# Patient Record
Sex: Female | Born: 1955 | Race: White | Hispanic: No | Marital: Single | Smoking: Former smoker
Health system: Southern US, Community
[De-identification: ages and names within clinical notes are randomized; demographics above are authoritative.]

## PROBLEM LIST (undated history)

## (undated) DIAGNOSIS — I10 Essential (primary) hypertension: Secondary | ICD-10-CM

## (undated) HISTORY — PX: GANGLION CYST EXCISION: SHX1691

## (undated) HISTORY — PX: WISDOM TOOTH EXTRACTION: SHX21

## (undated) HISTORY — PX: BACK SURGERY: SHX140

---

## 2000-01-20 ENCOUNTER — Encounter: Payer: Self-pay | Admitting: Family Medicine

## 2000-01-20 ENCOUNTER — Ambulatory Visit (HOSPITAL_COMMUNITY): Admission: RE | Admit: 2000-01-20 | Discharge: 2000-01-20 | Payer: Self-pay | Admitting: Family Medicine

## 2001-07-07 ENCOUNTER — Emergency Department (HOSPITAL_COMMUNITY): Admission: EM | Admit: 2001-07-07 | Discharge: 2001-07-07 | Payer: Self-pay | Admitting: Emergency Medicine

## 2001-07-07 ENCOUNTER — Encounter: Payer: Self-pay | Admitting: Emergency Medicine

## 2002-06-05 ENCOUNTER — Emergency Department (HOSPITAL_COMMUNITY): Admission: EM | Admit: 2002-06-05 | Discharge: 2002-06-05 | Payer: Self-pay | Admitting: Emergency Medicine

## 2002-06-05 ENCOUNTER — Encounter: Payer: Self-pay | Admitting: Emergency Medicine

## 2003-12-20 ENCOUNTER — Ambulatory Visit (HOSPITAL_BASED_OUTPATIENT_CLINIC_OR_DEPARTMENT_OTHER): Admission: RE | Admit: 2003-12-20 | Discharge: 2003-12-20 | Payer: Self-pay | Admitting: Orthopedic Surgery

## 2005-04-04 ENCOUNTER — Other Ambulatory Visit: Admission: RE | Admit: 2005-04-04 | Discharge: 2005-04-04 | Payer: Self-pay | Admitting: Family Medicine

## 2015-10-24 ENCOUNTER — Emergency Department (HOSPITAL_COMMUNITY)
Admission: EM | Admit: 2015-10-24 | Discharge: 2015-10-24 | Disposition: A | Discharge status: Home / Self Care | Payer: BLUE CROSS/BLUE SHIELD | Attending: Emergency Medicine | Admitting: Emergency Medicine

## 2015-10-24 ENCOUNTER — Emergency Department (HOSPITAL_COMMUNITY): Payer: BLUE CROSS/BLUE SHIELD

## 2015-10-24 ENCOUNTER — Encounter (HOSPITAL_COMMUNITY): Payer: Self-pay

## 2015-10-24 DIAGNOSIS — Z7982 Long term (current) use of aspirin: Secondary | ICD-10-CM | POA: Insufficient documentation

## 2015-10-24 DIAGNOSIS — Z9119 Patient's noncompliance with other medical treatment and regimen: Secondary | ICD-10-CM | POA: Insufficient documentation

## 2015-10-24 DIAGNOSIS — Z87891 Personal history of nicotine dependence: Secondary | ICD-10-CM | POA: Diagnosis not present

## 2015-10-24 DIAGNOSIS — R200 Anesthesia of skin: Secondary | ICD-10-CM | POA: Diagnosis not present

## 2015-10-24 DIAGNOSIS — I159 Secondary hypertension, unspecified: Secondary | ICD-10-CM | POA: Diagnosis not present

## 2015-10-24 DIAGNOSIS — I10 Essential (primary) hypertension: Secondary | ICD-10-CM | POA: Diagnosis present

## 2015-10-24 HISTORY — DX: Essential (primary) hypertension: I10

## 2015-10-24 LAB — BASIC METABOLIC PANEL
Anion gap: 12 (ref 5–15)
BUN: 10 mg/dL (ref 6–20)
CHLORIDE: 104 mmol/L (ref 101–111)
CO2: 23 mmol/L (ref 22–32)
CREATININE: 0.79 mg/dL (ref 0.44–1.00)
Calcium: 9.4 mg/dL (ref 8.9–10.3)
GFR calc non Af Amer: >60 mL/min (ref >60)
Glucose, Bld: 104 mg/dL — ABNORMAL HIGH (ref 65–99)
Potassium: 3.8 mmol/L (ref 3.5–5.1)
Sodium: 139 mmol/L (ref 135–145)

## 2015-10-24 LAB — CBC
HCT: 51.6 % — ABNORMAL HIGH (ref 36.0–46.0)
Hemoglobin: 17.3 g/dL — ABNORMAL HIGH (ref 12.0–15.0)
MCH: 32.8 pg (ref 26.0–34.0)
MCHC: 33.5 g/dL (ref 30.0–36.0)
MCV: 97.9 fL (ref 78.0–100.0)
PLATELETS: 222 10*3/uL (ref 150–400)
RBC: 5.27 MIL/uL — AB (ref 3.87–5.11)
RDW: 13.3 % (ref 11.5–15.5)
WBC: 7.4 10*3/uL (ref 4.0–10.5)

## 2015-10-24 LAB — I-STAT TROPONIN, ED: Troponin i, poc: 0.01 ng/mL (ref 0.00–0.08)

## 2015-10-24 MED ORDER — AMLODIPINE BESYLATE 10 MG PO TABS: 10.0000 mg | ORAL_TABLET | Freq: Every day | ORAL | Status: DC

## 2015-10-24 MED ORDER — HYDROCHLOROTHIAZIDE 12.5 MG PO TABS: 12.5000 mg | ORAL_TABLET | Freq: Every day | ORAL | Status: DC

## 2015-10-24 NOTE — ED Provider Notes (Addendum)
CSN: [id number]     Arrival date & time 10/24/15  1209 History   First MD Initiated Contact with Patient 10/24/15 1327     Chief Complaint  Patient presents with  . Hypertension  . Numbness     (Consider location/radiation/quality/duration/timing/severity/associated sxs/prior Treatment) HPI   Cynthia Morrison is a 60 y.o. female who presents for evaluation of a single episode of numbness left miles left arm, chest discomfort and headache, which lasted for 3 or 4 seconds, 3 days ago. It resolved spontaneously. She has not had a recurrence of this symptom. She feels normal today. She was encouraged to come in and get checked out, by her daughter. She is supposed to be on blood pressure medicines, but stopped them several years ago because she had lost some weight. 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 She admits to anxiety. She denies fever, chills, chest pain, cough, weakness or difficulty walking, at this time. There are no other known modifying factors.   Past Medical History  Diagnosis Date  . Hypertension    Past Surgical History  Procedure Laterality Date  . Back surgery     History reviewed. No pertinent family history. Social History  Substance Use Topics  . Smoking status: Former Games developer  . Smokeless tobacco: None  . Alcohol Use: Yes   OB History    No data available     Review of Systems  All other systems reviewed and are negative.     Allergies  Codeine and Aleve  Home Medications   Prior to Admission medications   Medication Sig Start Date End Date Taking? Authorizing Provider  aspirin 325 MG tablet Take 325 mg by mouth daily as needed for mild pain or headache.   Yes Historical Provider, MD  ibuprofen (ADVIL,MOTRIN) 200 MG tablet Take 200 mg by mouth daily as needed for headache or moderate pain.   Yes Historical Provider, MD  amLODipine (NORVASC) 10 MG tablet Take 1 tablet (10 mg total) by mouth daily. 10/24/15   Mancel Bale,  MD  hydrochlorothiazide (HYDRODIURIL) 12.5 MG tablet Take 1 tablet (12.5 mg total) by mouth daily. 10/24/15   Mancel Bale, MD   BP 211/107 mmHg  Pulse 118  Temp(Src) 98 F (36.7 C) (Oral)  Resp 15  SpO2 96% Physical Exam  Constitutional: She is oriented to person, place, and time. She appears well-developed and well-nourished.  HENT:  Head: Normocephalic and atraumatic.  Right Ear: External ear normal.  Left Ear: External ear normal.  Eyes: Conjunctivae and EOM are normal. Pupils are equal, round, and reactive to light.  Neck: Normal range of motion and phonation normal. Neck supple.  Cardiovascular: Normal rate, regular rhythm and normal heart sounds.   Pulmonary/Chest: Effort normal and breath sounds normal. She exhibits no bony tenderness.  Abdominal: Soft. There is no tenderness.  Musculoskeletal: Normal range of motion.  Neurological: She is alert and oriented to person, place, and time. No cranial nerve deficit or sensory deficit. She exhibits normal muscle tone. Coordination normal.  Skin: Skin is warm, dry and intact.  Psychiatric: She has a normal mood and affect. Her behavior is normal. Judgment and thought content normal.  Nursing note and vitals reviewed.   ED Course  Procedures (including critical care time)  Medications - No data to display  Patient Vitals for the past 24 hrs:  BP Temp Temp src Pulse Resp SpO2  10/24/15 1320 (!) 211/107 mmHg - - 118 15  96 %  10/24/15 1227 (!) 217/122 mmHg 98 F (36.7 C) Oral 119 20 100 %    2:34 PM Reevaluation with update and discussion. After initial assessment and treatment, an updated evaluation reveals no further complaints. Blood pressure spontaneously improved. Findings discussed with the patient, all questions were answered. Jashiya Bassett L     Labs Review Labs Reviewed  BASIC METABOLIC PANEL - Abnormal; Notable for the following:    Glucose, Bld 104 (*)    All other components within normal limits  CBC -  Abnormal; Notable for the following:    RBC 5.27 (*)    Hemoglobin 17.3 (*)    HCT 51.6 (*)    All other components within normal limits  I-STAT TROPOININ, ED    Imaging Review Dg Chest 2 View  10/24/2015  CLINICAL DATA:  Chest discomfort, high blood pressure EXAM: CHEST  2 VIEW COMPARISON:  None. FINDINGS: Cardiomediastinal silhouette is unremarkable. Mild mid thoracic dextroscoliosis. No acute infiltrate or pleural effusion. No pulmonary edema. There is moderate compression deformity upper endplate of probable T10 vertebral body of indeterminate age. Clinical correlation is necessary. IMPRESSION: No active disease. Compression deformity T10 vertebral body of indeterminate age. Clinical correlation is necessary. Electronically Signed   By: Natasha Mead M.D.   On: 10/24/2015 13:52   I have personally reviewed and evaluated these images and lab results as part of my medical decision-making.   EKG Interpretation   Date/Time:  Wednesday October 24 2015 12:31:49 EST Ventricular Rate:  116 PR Interval:  157 QRS Duration: 95 QT Interval:  316 QTC Calculation: 439 R Axis:   8 Text Interpretation:  Sinus tachycardia Biatrial enlargement Left  ventricular hypertrophy Borderline T abnormalities, anterior leads No old  tracing to compare Confirmed by [facility]  MD, Jaylan Hinojosa [phone]) on 10/24/2015  4:22:59 PM      MDM   Final diagnoses:  Secondary hypertension, unspecified    Hypertension, with medication noncompliance. Nonspecific brief episode of discomfort 3 days ago, has not persisted. I doubt that this represents CVA, TIA, or hypertensive urgency. No prior metabolic instability at this time. Patient understands importance of taking blood pressure medicines and follow-up, for further evaluation and treatment.   Nursing Notes Reviewed/ Care Coordinated Applicable Imaging Reviewed Interpretation of Laboratory Data incorporated into ED treatment  The patient appears reasonably screened and/or  stabilized for discharge and I doubt any other medical condition or other [facility] requiring further screening, evaluation, or treatment in the ED at this time prior to discharge.  Plan: Home Medications- Norvasc, HCTZ; Home Treatments-increase potassium in diet rest; return here if the recommended treatment, does not improve the symptoms; Recommended follow up- PCP of choice 2-3 weeks for checkup   Mancel Bale, MD 10/24/15 1617  Mancel Bale, MD 10/24/15 1623

## 2015-10-24 NOTE — Progress Notes (Signed)
[facility] ED CM noted pt with coverage but no pcp listed [facility] ED CM spoke with pt and her daughter Erie Noe at bedside on how to obtain an in network pcp with insurance coverage via the customer service number or web site  Cm reviewed ED level of care for crisis/emergent services and community pcp level of care to manage continuous or chronic medical concerns.  The pt voiced understanding CM encouraged pt and discussed pt's responsibility to verify with pt's insurance carrier that any recommended medical provider offered by any emergency room or a hospital provider is within the carrier's network. The pt voiced understanding

## 2015-10-24 NOTE — ED Notes (Signed)
Pt c/o numbness to L side mouth, L arm, and generalized chest x 1 minute x 2 days ago and hypertension.  Denies pain.  Pt reports severe headache after numbness episode.  Hx of HTN and sts she took herself off medication x 3 years ago.

## 2015-10-24 NOTE — Discharge Instructions (Signed)
Start the blood pressure medicine today. Make sure that you are eating foods that contain plenty of potassium. Watch your dietary intake of salt, fats, and carbohydrates. It is important to follow-up with a primary care doctor for further evaluation in one or 2 weeks. Use the resource guide listed below, to find a physician.     Hypertension Hypertension, commonly called high blood pressure, is when the force of blood pumping through your arteries is too strong. Your arteries are the blood vessels that carry blood from your heart throughout your body. A blood pressure reading consists of a higher number over a lower number, such as 110/72. The higher number (systolic) is the pressure inside your arteries when your heart pumps. The lower number (diastolic) is the pressure inside your arteries when your heart relaxes. Ideally you want your blood pressure below 120/80. Hypertension forces your heart to work harder to pump blood. Your arteries may become narrow or stiff. Having untreated or uncontrolled hypertension can cause heart attack, stroke, kidney disease, and other problems. RISK FACTORS Some risk factors for high blood pressure are controllable. Others are not.  Risk factors you cannot control include:   Race. You may be at higher risk if you are African American.  Age. Risk increases with age.  Gender. Men are at higher risk than women before age 33 years. After age 86, women are at higher risk than men. Risk factors you can control include:  Not getting enough exercise or physical activity.  Being overweight.  Getting too much fat, sugar, calories, or salt in your diet.  Drinking too much alcohol. SIGNS AND SYMPTOMS Hypertension does not usually cause signs or symptoms. Extremely high blood pressure (hypertensive crisis) may cause headache, anxiety, shortness of breath, and nosebleed. DIAGNOSIS To check if you have hypertension, your health care provider will measure your  blood pressure while you are seated, with your arm held at the level of your heart. It should be measured at least twice using the same arm. Certain conditions can cause a difference in blood pressure between your right and left arms. A blood pressure reading that is higher than normal on one occasion does not mean that you need treatment. If it is not clear whether you have high blood pressure, you may be asked to return on a different day to have your blood pressure checked again. Or, you may be asked to monitor your blood pressure at home for 1 or more weeks. TREATMENT Treating high blood pressure includes making lifestyle changes and possibly taking medicine. Living a healthy lifestyle can help lower high blood pressure. You may need to change some of your habits. Lifestyle changes may include:  Following the DASH diet. This diet is high in fruits, vegetables, and whole grains. It is low in salt, red meat, and added sugars.  Keep your sodium intake below 2,300 mg per day.  Getting at least 30-45 minutes of aerobic exercise at least 4 times per week.  Losing weight if necessary.  Not smoking.  Limiting alcoholic beverages.  Learning ways to reduce stress. Your health care provider may prescribe medicine if lifestyle changes are not enough to get your blood pressure under control, and if one of the following is true:  You are 38-75 years of age and your systolic blood pressure is above 140.  You are 22 years of age or older, and your systolic blood pressure is above 150.  Your diastolic blood pressure is above 90.  You have diabetes, and  your systolic blood pressure is over 140 or your diastolic blood pressure is over 90.  You have kidney disease and your blood pressure is above 140/90.  You have heart disease and your blood pressure is above 140/90. Your personal target blood pressure may vary depending on your medical conditions, your age, and other factors. HOME CARE  INSTRUCTIONS  Have your blood pressure rechecked as directed by your health care provider.   Take medicines only as directed by your health care provider. Follow the directions carefully. Blood pressure medicines must be taken as prescribed. The medicine does not work as well when you skip doses. Skipping doses also puts you at risk for problems.  Do not smoke.   Monitor your blood pressure at home as directed by your health care provider. SEEK MEDICAL CARE IF:   You think you are having a reaction to medicines taken.  You have recurrent headaches or feel dizzy.  You have swelling in your ankles.  You have trouble with your vision. SEEK IMMEDIATE MEDICAL CARE IF:  You develop a severe headache or confusion.  You have unusual weakness, numbness, or feel faint.  You have severe chest or abdominal pain.  You vomit repeatedly.  You have trouble breathing. MAKE SURE YOU:   Understand these instructions.  Will watch your condition.  Will get help right away if you are not doing well or get worse.   This information is not intended to replace advice given to you by your health care provider. Make sure you discuss any questions you have with your health care provider.   Document Released: 09/08/2005 Document Revised: 01/23/2015 Document Reviewed: 07/01/2013 Elsevier Interactive Patient Education 2016 [employer].    Emergency Department Resource Guide 1) Find a Doctor and Pay Out of Pocket Although you won't have to find out who is covered by your insurance plan, it is a good idea to ask around and get recommendations. You will then need to call the office and see if the doctor you have chosen will accept you as a new patient and what types of options they offer for patients who are self-pay. Some doctors offer discounts or will set up payment plans for their patients who do not have insurance, but you will need to ask so you aren't surprised when you get to your  appointment.  2) Contact Your Local Health Department Not all health departments have doctors that can see patients for sick visits, but many do, so it is worth a call to see if yours does. If you don't know where your local health department is, you can check in your phone book. The CDC also has a tool to help you locate your state's health department, and many state websites also have listings of all of their local health departments.  3) Find a Walk-in Clinic If your illness is not likely to be very severe or complicated, you may want to try a walk in clinic. These are popping up all over the country in pharmacies, drugstores, and shopping centers. They're usually staffed by nurse practitioners or physician assistants that have been trained to treat common illnesses and complaints. They're usually fairly quick and inexpensive. However, if you have serious medical issues or chronic medical problems, these are probably not your best option.  No Primary Care Doctor: - Call Health Connect at  [phone] - they can help you locate a primary care doctor that  accepts your insurance, provides certain services, etc. - Physician Referral Service- [phone]  Chronic Pain Problems: Organization         Address  Phone   Notes  [facility]  [phone] Patients need to be referred by their primary care doctor.   Medication Assistance: Organization         Address  Phone   Notes  [facility] Medication [facility] [address] [place], [place] [address] [phone] --Must be a resident of [facility] -- Must have NO insurance coverage whatsoever (no Medicaid/ Medicare, etc.) -- The pt. MUST have a primary care doctor that directs their care regularly and follows them in the community   [facility]  [phone]   [employer]  [phone]    Agencies that provide inexpensive medical care: Organization         Address  Phone   Notes  [facility]  [phone]   [facility]    [phone]   [facility] [address] [place], [place] [address] [phone]   [facility] [address], [place] [phone]   Planned Parenthood    [phone]   [facility]    [phone]   [facility]  [address], [place] Phone:  [phone], Fax:  [phone] Hours of Operation:  9 am - 6 pm, M-F.  Also accepts Medicaid/Medicare and self-pay.  [facility]  [address], [place] Phone: [phone], Fax: [phone]. Hours of Operation:  8:30 am - 5:30 pm, M-F.  Also accepts Medicaid and self-pay.  [facility] [place] [address] [place] Phone: [phone]   [facility] [address] Natasha Bence [place], [place] [phone], Ext. [phone] Mondays & Thursdays: 7-9 AM.  First 15 patients are seen on a first come, first serve basis.    Medicaid-accepting [facility] Providers:  Organization         Address  Phone   Notes  [facility] [address], [place] [phone] Also accepts self-pay patients.  [facility] [address] Laurell Josephs [place], [place]  [phone]   [facility] [address], [place] [phone]   [facility] [address], [place] [phone]   Renaye Rakers [address], [place]   [phone] Only accepts [place] Access [place] patients after they have their name applied to their card.   Self-Pay (no insurance) in [facility]:  Organization         Address  Phone   Notes  Sickle Cell Patients, [facility] [address] [place], [place] [phone]   [facility] [address] [place], [place] [phone]   [facility]  [place]  [address], [place] [phone]   [facility]/Dr. Osei-Bonsu  [address] [place] or [address], [place] [phone] Phone number for both [place] and [place] locations is the same.  [facility] [address] [place] [phone]   [facility] [address] [place] or [address] [phone]   [facility] [address], [place] [phone], phone; [phone], fax Sees  patients 1st and 3rd Saturday of every month.  Must not qualify for public or private insurance (i.e. Medicaid, Medicare, [place] Health Choice, Veterans' Benefits)  Household income should be no more than 200% of the poverty level The clinic cannot treat you if you are pregnant or think you are pregnant  Sexually transmitted diseases are not treated at the clinic.    Dental Care: Organization         Address  Phone  Notes  [facility] Department of [facility] [address] [place], [place] [phone] Accepts children up to age 62 who are enrolled in [place] or [place] Health Choice; pregnant women with a Medicaid card; and children who have applied for Medicaid or [place] Health Choice, but were declined, whose parents can pay a reduced fee at time of service.  [facility] Department of [facility]  [address], [place] [phone] Accepts children up to age 26 who are enrolled in [place] or [place] Health Choice; pregnant women with a Medicaid card; and children who have applied for Medicaid or [place] Health Choice, but were declined, whose parents can pay a reduced fee at time of service.  [facility]  [address] [place], [place] [phone] Patients are seen by appointment only. Walk-ins are not accepted. [facility] will see patients 80 years of age and  older. Monday - Tuesday (8am-5pm) Most Wednesdays (8:30-5pm) $30 per visit, cash only  [facility]  [address], [facility] [phone] Patients are seen by appointment only. Walk-ins are not accepted. [facility] will see patients 13 years of age and older. One Wednesday Evening (Monthly: Volunteer Based).  $30 per visit, cash only  [employer]  [phone] for adults; Children under age 75, call Graduate Pediatric Dentistry at [phone]. Children aged 110-14, please call [phone] to request a pediatric application.  Dental services are provided in all areas of dental care including fillings, crowns and bridges, complete and partial dentures, implants, gum treatment, root canals, and extractions. Preventive care is also provided. Treatment is provided to both adults and children. Patients are selected via a lottery and there is often a waiting list.   [facility] [address], [place]  [phone] [email or web address]   [facility] [address] [place], [place] [phone], Ext. [phone] Second and Fourth Thursday of each month, opens at 6:30 AM; Clinic ends at 9 AM.  Patients are seen on a first-come first-served basis, and a limited number are seen during each clinic.   [facility]  [address] Ether Griffins [place], [place] [phone]   Eligibility Requirements You must have lived in [place], [place], or [place] counties for at least the last three months.   You cannot be eligible for state or federal sponsored [place], including [employer], [place], or [employer].   You generally cannot be eligible for healthcare insurance through your employer.    How to apply: Eligibility screenings are held every Tuesday and Wednesday afternoon from 1:00 pm until 4:00 pm. You do not need an appointment for the interview!  [facility] [address],  [place], [place] [phone]   [facility]  [phone]   [facility] Health Department  [phone]   [facility] Health Department  [phone]    Behavioral Health Resources in the Community: Intensive Outpatient Programs  Organization         Address  Phone  Notes  [facility] [address] [place], [place] [phone]   [facility] [address] [place], [place] [phone]   [facility] [address], [place], [place]  [phone]   [facility] [address]  [place], [place] [phone] or [phone]   Substance Abuse Resources Organization         Address  Phone  Notes  [facility]  [phone]   [facility]  [phone]   [facility]  [phone]   Floydene Flock  [phone]   Residential & Outpatient Substance Abuse Program  [phone]   Psychological Services Organization         Address  Phone  Notes  [facility]al Health  [phone]   [facility] Services  [phone]   [facility] [address] [place] [phone] or [phone]    Mobile Crisis Teams Organization         Address  Phone  Notes  [facility], Mobile Crisis Care Unit  [phone]   [facility]  [address] [place], [place] [phone]   Doristine Locks [address] [place] [phone]    Self-Help/Support Groups Organization         Address  Phone             Notes  [facility] [place] - variety of support groups  [phone]- [phone] Call for more information  Narcotics Anonymous (NA), [facility] [address], [employer] [place]  2 meetings at this location   Statistician         Address  Phone  Notes  [facility] [address],    [place]  [phone]   [facility]  [address], [place] [address], [place], [place] [phone]   [facility] [address] [place], [place] [phone] Admissions: 8am-3pm M-F  [facility] [address]    [place], [place] [phone]   [facility] [address] [place], [place], [place] [phone]   [facility] [place] [address]  [place], [place] [phone]   [facility] - Intensive Outpatient [address] Laurell Josephs [address], [place], [place] [phone]   [facility] ([facility] [address]  [place], [place] [phone] or [phone]   [facility] ([facility]) [address] [place], [place] [phone] Accepts Medicaid  [facility] [place] [address]  [place] [phone] Substance Abuse/Addiction Treatment   [facility] Organization         Address  Phone  Notes  [facility]  [phone]   Angie Fava, PhD [address] Ervin Knack [place], [place]   [phone] or [phone]   [facility]   [address] [place], [place] [phone]   [facility] [address], [place], [place] [phone] Insurance/Medicaid/sponsorship through [facility] [address]                                    [place], [place] [phone] Therapy/tele-psych/case  [facility] [address] [place] [phone]    Dr. Lolly Mustache  (  [phone]   [facility]  [facility] Dept. 1) [address], [place] 2) [address] [place] 3)  [address], [place] [phone]  [phone]   [facility] Child Abuse Hotline [phone]) [phone] or 2405143370 (After Hours)        DASH Eating Plan DASH stands for "Dietary Approaches to Stop Hypertension." The DASH eating plan is a healthy eating plan that has been shown to reduce  high blood pressure (hypertension). Additional health benefits may include reducing the risk of type 2 diabetes mellitus, heart disease, and stroke. The DASH eating plan may also help with weight loss. WHAT DO I NEED TO KNOW ABOUT THE DASH EATING PLAN? For the DASH eating plan, you will follow these general guidelines:  Choose foods with a percent daily value for sodium of less than 5% (as listed on the food label).  Use salt-free seasonings or herbs instead of table salt or sea salt.  Check with your health care provider or pharmacist before using salt substitutes.  Eat lower-sodium products, often labeled as "lower sodium" or "no salt added."  Eat fresh foods.  Eat more vegetables, fruits, and low-fat dairy products.  Choose whole grains. Look for the word "whole" as the first word in the ingredient list.  Choose fish and skinless chicken or [place] more often than red meat. Limit fish, poultry, and meat to 6 oz (170 g) each day.  Limit sweets, desserts, sugars, and sugary drinks.  Choose heart-healthy fats.  Limit cheese to 1 oz (28 g) per day.  Eat more home-cooked food and less restaurant, buffet, and fast food.  Limit fried foods.  Cook foods using methods other than frying.  Limit canned vegetables. If you do use them, rinse them well to decrease the sodium.  When eating at a restaurant, ask that your food be prepared with less salt, or no salt if possible. WHAT FOODS CAN I EAT? Seek help from a dietitian for individual calorie needs. Grains Whole grain or whole wheat bread. Brown rice. Whole grain or whole wheat pasta. Quinoa, bulgur, and whole grain cereals. Low-sodium cereals. Corn or whole wheat flour tortillas. Whole grain cornbread. Whole grain crackers. Low-sodium crackers. Vegetables Fresh or frozen vegetables (raw, steamed, roasted, or grilled). Low-sodium or reduced-sodium tomato and vegetable juices. Low-sodium or reduced-sodium tomato sauce and paste.  Low-sodium or reduced-sodium canned vegetables.  Fruits All fresh, canned (in natural juice), or frozen fruits. Meat and Other Protein Products Ground beef (85% or leaner), grass-fed beef, or beef trimmed of fat. Skinless chicken or [place]. Ground chicken or [place]. Pork trimmed of fat. All fish and seafood. Eggs. Dried beans, peas, or lentils. Unsalted nuts and seeds. Unsalted canned beans. Dairy Low-fat dairy products, such as skim or 1% milk, 2% or reduced-fat cheeses, low-fat ricotta or cottage cheese, or plain low-fat yogurt. Low-sodium or reduced-sodium cheeses. Fats and Oils Tub margarines without trans fats. Light or reduced-fat mayonnaise and salad dressings (reduced sodium). Avocado. Safflower, olive, or canola oils. Natural peanut or almond butter. Other Unsalted popcorn and pretzels. The items listed above may not be a complete list of recommended foods or beverages. Contact your dietitian for more options. WHAT FOODS ARE NOT RECOMMENDED? Grains White bread. White pasta. White rice. Refined cornbread. Bagels and croissants. Crackers that contain trans fat. Vegetables Creamed or fried vegetables. Vegetables in a cheese sauce. Regular canned vegetables. Regular canned tomato sauce and paste. Regular  tomato and vegetable juices. Fruits Dried fruits. Canned fruit in light or heavy syrup. Fruit juice. Meat and Other Protein Products Fatty cuts of meat. Ribs, chicken wings, bacon, sausage, bologna, salami, chitterlings, fatback, hot dogs, bratwurst, and packaged luncheon meats. Salted nuts and seeds. Canned beans with salt. Dairy Whole or 2% milk, cream, half-and-half, and cream cheese. Whole-fat or sweetened yogurt. Full-fat cheeses or blue cheese. Nondairy creamers and whipped toppings. Processed cheese, cheese spreads, or cheese curds. Condiments Onion and garlic salt, seasoned salt, table salt, and sea salt. Canned and packaged gravies. Worcestershire sauce. Tartar sauce. Barbecue  sauce. Teriyaki sauce. Soy sauce, including reduced sodium. Steak sauce. Fish sauce. Oyster sauce. Cocktail sauce. Horseradish. Ketchup and mustard. Meat flavorings and tenderizers. Bouillon cubes. Hot sauce. Tabasco sauce. Marinades. Taco seasonings. Relishes. Fats and Oils Butter, stick margarine, lard, shortening, ghee, and bacon fat. Coconut, palm kernel, or palm oils. Regular salad dressings. Other Pickles and olives. Salted popcorn and pretzels. The items listed above may not be a complete list of foods and beverages to avoid. Contact your dietitian for more information. WHERE CAN I FIND MORE INFORMATION? National Heart, Lung, and Blood Institute: [employer]   This information is not intended to replace advice given to you by your health care provider. Make sure you discuss any questions you have with your health care provider.   Document Released: 08/28/2011 Document Revised: 09/29/2014 Document Reviewed: 07/13/2013 Elsevier Interactive Patient Education 2016 [employer] Inc.  Potassium Content of Foods Potassium is a mineral found in many foods and drinks. It helps keep fluids and minerals balanced in your body and affects how steadily your heart beats. Potassium also helps control your blood pressure and keep your muscles and nervous system healthy. Certain health conditions and medicines may change the balance of potassium in your body. When this happens, you can help balance your level of potassium through the foods that you do or do not eat. Your health care provider or dietitian may recommend an amount of potassium that you should have each day. The following lists of foods provide the amount of potassium (in parentheses) per serving in each item. HIGH IN POTASSIUM  The following foods and beverages have 200 mg or more of potassium per serving:  Apricots, 2 raw or 5 dry (200 mg).  Artichoke, 1 medium (345 mg).  Avocado, raw,  each (245  mg).  Banana, 1 medium (425 mg).  Beans, lima, or baked beans, canned,  cup (280 mg).  Beans, white, canned,  cup (595 mg).  Beef roast, 3 oz (320 mg).  Beef, ground, 3 oz (270 mg).  Beets, raw or cooked,  cup (260 mg).  Bran muffin, 2 oz (300 mg).  Broccoli,  cup (230 mg).  Brussels sprouts,  cup (250 mg).  Cantaloupe,  cup (215 mg).  Cereal, 100% bran,  cup (200-400 mg).  Cheeseburger, single, fast food, 1 each (225-400 mg).  Chicken, 3 oz (220 mg).  Clams, canned, 3 oz (535 mg).  Crab, 3 oz (225 mg).  Dates, 5 each (270 mg).  Dried beans and peas,  cup (300-475 mg).  Figs, dried, 2 each (260 mg).  Fish: halibut, tuna, cod, snapper, 3 oz (480 mg).  Fish: salmon, haddock, swordfish, perch, 3 oz (300 mg).  Fish, tuna, canned 3 oz (200 mg).  [place] fries, fast food, 3 oz (470 mg).  Granola with fruit and nuts,  cup (200 mg).  Grapefruit juice,  cup (200 mg).  Greens, beet,  cup (655 mg).  Honeydew melon,  cup (200 mg).  Kale, raw, 1 cup (300 mg).  Kiwi, 1 medium (240 mg).  Kohlrabi, rutabaga, parsnips,  cup (280 mg).  Lentils,  cup (365 mg).  Mango, 1 each (325 mg).  Milk, chocolate, 1 cup (420 mg).  Milk: nonfat, low-fat, whole, buttermilk, 1 cup (350-380 mg).  Molasses, 1 Tbsp (295 mg).  Mushrooms,  cup (280) mg.  Nectarine, 1 each (275 mg).  Nuts: almonds, peanuts, hazelnuts, [place], cashew, mixed, 1 oz (200 mg).  Nuts, pistachios, 1 oz (295 mg).  Orange, 1 each (240 mg).  Orange juice,  cup (235 mg).  Papaya, medium,  fruit (390 mg).  Peanut butter, chunky, 2 Tbsp (240 mg).  Peanut butter, smooth, 2 Tbsp (210 mg).  Pear, 1 medium (200 mg).  Pomegranate, 1 whole (400 mg).  Pomegranate juice,  cup (215 mg).  Pork, 3 oz (350 mg).  Potato chips, salted, 1 oz (465 mg).  Potato, baked with skin, 1 medium (925 mg).  Potatoes, boiled,  cup (255 mg).  Potatoes, mashed,  cup (330 mg).  Prune juice,   cup (370 mg).  Prunes, 5 each (305 mg).  Pudding, chocolate,  cup (230 mg).  Pumpkin, canned,  cup (250 mg).  Raisins, seedless,  cup (270 mg).  Seeds, sunflower or pumpkin, 1 oz (240 mg).  Soy milk, 1 cup (300 mg).  Spinach,  cup (420 mg).  Spinach, canned,  cup (370 mg).  Sweet potato, baked with skin, 1 medium (450 mg).  Swiss chard,  cup (480 mg).  Tomato or vegetable juice,  cup (275 mg).  Tomato sauce or puree,  cup (400-550 mg).  Tomato, raw, 1 medium (290 mg).  Tomatoes, canned,  cup (200-300 mg).  [place], 3 oz (250 mg).  Wheat germ, 1 oz (250 mg).  Winter squash,  cup (250 mg).  Yogurt, plain or fruited, 6 oz (260-435 mg).  Zucchini,  cup (220 mg). MODERATE IN POTASSIUM The following foods and beverages have 50-200 mg of potassium per serving:  Apple, 1 each (150 mg).  Apple juice,  cup (150 mg).  Applesauce,  cup (90 mg).  Apricot nectar,  cup (140 mg).  Asparagus, small spears,  cup or 6 spears (155 mg).  Bagel, cinnamon raisin, 1 each (130 mg).  Bagel, egg or plain, 4 in., 1 each (70 mg).  Beans, green,  cup (90 mg).  Beans, yellow,  cup (190 mg).  Beer, regular, 12 oz (100 mg).  Beets, canned,  cup (125 mg).  Blackberries,  cup (115 mg).  Blueberries,  cup (60 mg).  Bread, whole wheat, 1 slice (70 mg).  Broccoli, raw,  cup (145 mg).  Cabbage,  cup (150 mg).  Carrots, cooked or raw,  cup (180 mg).  Cauliflower, raw,  cup (150 mg).  Celery, raw,  cup (155 mg).  Cereal, bran flakes, cup (120-150 mg).  Cheese, cottage,  cup (110 mg).  Cherries, 10 each (150 mg).  Chocolate, 1 oz bar (165 mg).  Coffee, brewed 6 oz (90 mg).  Corn,  cup or 1 ear (195 mg).  Cucumbers,  cup (80 mg).  Egg, large, 1 each (60 mg).  Eggplant,  cup (60 mg).  Endive, raw, cup (80 mg).  English muffin, 1 each (65 mg).  Fish, orange roughy, 3 oz (150 mg).  Frankfurter, beef or pork, 1 each (75  mg).  Fruit cocktail,  cup (115 mg).  Grape juice,  cup (170 mg).  Grapefruit,  fruit (175  mg).  Grapes,  cup (155 mg).  Greens: kale, turnip, collard,  cup (110-150 mg).  Ice cream or frozen yogurt, chocolate,  cup (175 mg).  Ice cream or frozen yogurt, vanilla,  cup (120-150 mg).  Lemons, limes, 1 each (80 mg).  Lettuce, all types, 1 cup (100 mg).  Mixed vegetables,  cup (150 mg).  Mushrooms, raw,  cup (110 mg).  Nuts: walnuts, pecans, or macadamia, 1 oz (125 mg).  Oatmeal,  cup (80 mg).  Okra,  cup (110 mg).  Onions, raw,  cup (120 mg).  Peach, 1 each (185 mg).  Peaches, canned,  cup (120 mg).  Pears, canned,  cup (120 mg).  Peas, green, frozen,  cup (90 mg).  Peppers, green,  cup (130 mg).  Peppers, red,  cup (160 mg).  Pineapple juice,  cup (165 mg).  Pineapple, fresh or canned,  cup (100 mg).  Plums, 1 each (105 mg).  Pudding, vanilla,  cup (150 mg).  Raspberries,  cup (90 mg).  Rhubarb,  cup (115 mg).  Rice, wild,  cup (80 mg).  Shrimp, 3 oz (155 mg).  Spinach, raw, 1 cup (170 mg).  Strawberries,  cup (125 mg).  Summer squash  cup (175-200 mg).  Swiss chard, raw, 1 cup (135 mg).  Tangerines, 1 each (140 mg).  Tea, brewed, 6 oz (65 mg).  Turnips,  cup (140 mg).  Watermelon,  cup (85 mg).  Wine, red, table, 5 oz (180 mg).  Wine, white, table, 5 oz (100 mg). LOW IN POTASSIUM The following foods and beverages have less than 50 mg of potassium per serving.  Bread, white, 1 slice (30 mg).  Carbonated beverages, 12 oz (less than 5 mg).  Cheese, 1 oz (20-30 mg).  Cranberries,  cup (45 mg).  Cranberry juice cocktail,  cup (20 mg).  Fats and oils, 1 Tbsp (less than 5 mg).  Hummus, 1 Tbsp (32 mg).  Nectar: papaya, mango, or pear,  cup (35 mg).  Rice, white or brown,  cup (50 mg).  Spaghetti or macaroni,  cup cooked (30 mg).  Tortilla, flour or corn, 1 each (50 mg).  Waffle, 4 in., 1  each (50 mg).  Water chestnuts,  cup (40 mg).   This information is not intended to replace advice given to you by your health care provider. Make sure you discuss any questions you have with your health care provider.   Document Released: 04/22/2005 Document Revised: 09/13/2013 Document Reviewed: 08/05/2013 Elsevier Interactive Patient Education [employer]

## 2016-02-01 ENCOUNTER — Telehealth: Payer: Self-pay | Admitting: Neurology

## 2016-02-01 ENCOUNTER — Encounter: Payer: Self-pay | Admitting: Neurology

## 2016-02-01 ENCOUNTER — Ambulatory Visit (INDEPENDENT_AMBULATORY_CARE_PROVIDER_SITE_OTHER): Payer: BLUE CROSS/BLUE SHIELD | Admitting: Neurology

## 2016-02-01 VITALS — BP 176/98 | HR 100 | Resp 18 | Ht 61.0 in | Wt 149.5 lb

## 2016-02-01 DIAGNOSIS — R42 Dizziness and giddiness: Secondary | ICD-10-CM

## 2016-02-01 DIAGNOSIS — F411 Generalized anxiety disorder: Secondary | ICD-10-CM | POA: Diagnosis not present

## 2016-02-01 DIAGNOSIS — I10 Essential (primary) hypertension: Secondary | ICD-10-CM

## 2016-02-01 MED ORDER — BUSPIRONE HCL 15 MG PO TABS: 15.0000 mg | ORAL_TABLET | Freq: Two times a day (BID) | ORAL | Status: DC

## 2016-02-01 NOTE — Progress Notes (Signed)
[facility]  PATIENT: Cynthia Morrison DOB: 12/29/1955  REFERRING DOCTOR OR PCP:  Juluis Rainier SOURCE: patient, and records from [facility]  _________________________________   HISTORICAL  CHIEF COMPLAINT:  Chief Complaint  Patient presents with  . Dizziness    [name] is here with c/o intermittent dizziness onset 3 mos. ago, after starting Lisinopril, Toprol, HCTZ, and Clonazepam.  Sts. dizziness occurs after taking each dose of Lisinopril.  Sts. has had full cardiac w/u which was neg/fim    HISTORY OF PRESENT ILLNESS:  I had the pleasure seeing you patient, Cynthia Morrison, at [facility] for neurologic consultation regarding her lightheadedness and other symptoms.  She was feeling at baseline until January this year and she was found to have hypertension and was started on blood pressure medications. On 10/24/2015, she went to the emergency room and had a blood pressure of 217/122.  Currently, she is on lisinopril 20 mg twice a day, norvasc 10 mg  metoprolol XL 50 mg daily and HCTZ 12.5 mg daily.  Since starting blood pressure medication, she notes that she has a low level of lightheadedness when she is sitting that becomes worse when she is standing.    She also notes that her lightheadedness will be worse when she does not eat. She needs to also eat to take her blood pressure medications. Unfortunately, she gets a lot of coughing starting the lisinopril, often to the point of nausea and that reduces her ability to eat.    She denies any spinning or translational vertigo sensation  She also has had quite a bit of anxiety this year. She did not note any anxiety last year but since January has experienced much more. Specifically, she notes that when she is in a busy environment either at work or outside of her home that she can feel lightheaded and more likely to feel anxious. In her home, lightheadedness usually only occurs when she is standing. However,  if she is in a different environment she also will have some while sitting but will still be worse when she stands.      She is noting much less ability to focus at work because of the dizziness. She works in Civil engineer, contracting. She has not worked since February 15 because she is felt to be unsafe due to her symptoms. She also has not been driving since then because she feels that there is decreased peripheral vision and that she is unsafe to drive. Of note, she does not note difficulties with her vision if she is a passenger instead of the driver. Therefore, she has been mostly staying in the house the last 2 months.   She also has noted that for the past couple months she has felt disoriented when she is in a busy place like a shopping center normal. She does not feel the need to escape and does not feel panicky.  She was tried on Zoloft for her anxiety but felt strange and had nausea when she took it so stopped after 1 day. She does take on clonazepam 0.5 mg about twice a week. It makes her sleepy so she does not want to increase the dose any. She has not been tried on other SSRIs or on BuSpar. A few years ago, when she was having pain after neurosurgery, she was placed on amitriptyline and felt better.    She recently saw a cardiologist and the lisinopril dose was decreased from 40 mg twice a day to  20 mg twice a day. She does think that there has been a little bit less coughing since she has done that.  This morning, her stomach was upset so she did not take her blood pressure medicines before the visit. Orthostatic vital signs are below.    REVIEW OF SYSTEMS: Constitutional: No fevers, chills, sweats, or change in appetite.    Eyes: No visual changes, double vision, eye pain Ear, nose and throat: No hearing loss, ear pain, nasal congestion, sore throat Cardiovascular: No chest pain, palpitations Respiratory: No shortness of breath at rest or with exertion.   No  wheezes GastrointestinaI: No nausea, vomiting, diarrhea, abdominal pain now but gets nauseous frequently.   Genitourinary: No dysuria, urinary retention or frequency.  No nocturia. Musculoskeletal: No neck pain, back pain Integumentary: No rash, pruritus, skin lesions Neurological: as above Psychiatric: No depression at this time.  Notes anxiety Endocrine: No palpitations, diaphoresis, change in appetite, change in weigh or increased thirst Hematologic/Lymphatic: No anemia, purpura, petechiae. Allergic/Immunologic: No itchy/runny eyes, nasal congestion, recent allergic reactions, rashes  ALLERGIES: Allergies  Allergen Reactions  . Codeine Other (See Comments)    Nightmares   . Aleve [Naproxen Sodium] Rash    HOME MEDICATIONS:  Current outpatient prescriptions:  .  amLODipine (NORVASC) 10 MG tablet, Take 1 tablet (10 mg total) by mouth daily., Disp: 30 tablet, Rfl: 3 .  aspirin 325 MG tablet, Take 325 mg by mouth daily as needed for mild pain or headache., Disp: , Rfl:  .  clonazePAM (KLONOPIN) 0.5 MG tablet, take 1 tablet by mouth twice a day if needed, Disp: , Rfl: 0 .  hydrochlorothiazide (HYDRODIURIL) 12.5 MG tablet, Take 1 tablet (12.5 mg total) by mouth daily., Disp: 30 tablet, Rfl: 3 .  ibuprofen (ADVIL,MOTRIN) 200 MG tablet, Take 200 mg by mouth daily as needed for headache or moderate pain., Disp: , Rfl:  .  lisinopril (PRINIVIL,ZESTRIL) 40 MG tablet, Take 40 mg by mouth 2 (two) times daily., Disp: , Rfl: 0 .  metoprolol succinate (TOPROL-XL) 50 MG 24 hr tablet, Take 75 mg by mouth daily., Disp: , Rfl: 0  PAST MEDICAL HISTORY: Past Medical History  Diagnosis Date  . Hypertension     PAST SURGICAL HISTORY: Past Surgical History  Procedure Laterality Date  . Back surgery    . Wisdom tooth extraction    . Ganglion cyst excision Right     FAMILY HISTORY: Family History  Problem Relation Age of Onset  . Hypertension Mother   . Heart failure Father   . Pancreatic  cancer Father     SOCIAL HISTORY:  Social History   Social History  . Marital Status: Single    Spouse Name: N/A  . Number of Children: N/A  . Years of Education: N/A   Occupational History  . Not on file.   Social History Main Topics  . Smoking status: Former Games developer  . Smokeless tobacco: Not on file  . Alcohol Use: Yes  . Drug Use: No  . Sexual Activity: Not on file   Other Topics Concern  . Not on file   Social History Narrative     PHYSICAL EXAM  Filed Vitals:   02/01/16 0958  BP: 176/98  Pulse: 100  Resp: 18  Height: 5\' 1"  (1.549 m)  Weight: 149 lb 8 oz (67.813 kg)    Body mass index is 28.26 kg/(m^2).  ORTHOSTATIC VS Lying   176/98, 100 Sitting  150/106, 112 Standing (15 seconds)  148/96, 120  Standing 3 minutes 130/98, 124  General: The patient is well-developed and well-nourished and in no acute distress  HEENT:  Funduscopic exam shows normal optic discs and retinal vessels.   Tympanic membranes were intact and the ear canals were clear.  Neck: The neck is supple, no carotid bruits are noted.  The neck is nontender.  Cardiovascular: The heart has a tachycardic regular rate and rhythm with a normal S1 and S2. There were no murmurs, gallops or rubs. Lungs are clear to auscultation.  Skin: Extremities are without significant edema.  Musculoskeletal:  Back is nontender  Neurologic Exam  Mental status: The patient is alert and oriented x 3 at the time of the examination. The patient has apparent normal recent and remote memory, with an apparently normal attention span and concentration ability.   Speech is normal.  Cranial nerves: Extraocular movements are full. Pupils are equal, round, and reactive to light and accomodation.  Visual fields are full.  Facial symmetry is present. There is good facial sensation to soft touch bilaterally.Facial strength is normal.  Trapezius and sternocleidomastoid strength is normal. No dysarthria is noted.  The tongue  is midline, and the patient has symmetric elevation of the soft palate. No obvious hearing deficits are noted.  Motor:  Muscle bulk is normal.   Tone is normal. Strength is  5 / 5 in all 4 extremities.   Sensory: Sensory testing is intact to pinprick, soft touch and vibration sensation in all 4 extremities.  Coordination: Cerebellar testing reveals good finger-nose-finger and heel-to-shin bilaterally.  Gait and station: Station is normal.   Gait is normal. Tandem gait is normal. Romberg is negative.   Reflexes: Deep tendon reflexes are symmetric and normal bilaterally.   Plantar responses are flexor.    DIAGNOSTIC DATA (LABS, IMAGING, TESTING) - I reviewed patient records, labs, notes, testing and imaging myself where available.  Lab Results  Component Value Date   WBC 7.4 10/24/2015   HGB 17.3* 10/24/2015   HCT 51.6* 10/24/2015   MCV 97.9 10/24/2015   PLT 222 10/24/2015      Component Value Date/Time   NA 139 10/24/2015 1308   K 3.8 10/24/2015 1308   CL 104 10/24/2015 1308   CO2 23 10/24/2015 1308   GLUCOSE 104* 10/24/2015 1308   BUN 10 10/24/2015 1308   CREATININE 0.79 10/24/2015 1308   CALCIUM 9.4 10/24/2015 1308   GFRNONAA >60 10/24/2015 1308   GFRAA >60 10/24/2015 1308       ASSESSMENT AND PLAN  Essential hypertension  Lightheadedness  Anxiety state   In summary, Mrs. Silvestre GunnerO'Keeffe is a 60 year old woman who has had 4-5 months of intermittent lightheadedness and anxiety. Symptoms started after her diagnosis of hypertension and being placed on medications.    Unfortunately, because she had GI upset this morning, she did not take her medications as usual area and orthostatic vital signs showed that she was fairly hypertensive laying down with BP 176/98 and a pulse 100. Although she did use a reduction in blood pressure and an increase in pulse with standing up, BP was still mildly elevated at 1:30/98. Of note, even though BP is elevated in the office today she has  some lightheadedness while sitting and just a little bit more while standing.      With her not getting into a hypotensive range, I do not think she would benefit from fludrocortisone.   She seems to have trouble tolerating some of her blood pressure medications and that may also  be playing a role with lightheadedness. Additionally she has a lot of coughing, sometimes to the point of nausea and that might be a side effect of the lisinopril. She might benefit from change in hypertension medication.  I started BuSpar 15 mg by mouth twice a day (one half pill twice a day for the first 2-3 days) as it is generally well tolerated and will hopefully help the anxiety which seems to be playing some role in her symptoms as well.  She will see me back in about 6 weeks and we will reassess at that time and consider further evaluation or treatment based on her response. She should call us back sooner if she notes no benefit at all with the BuSpar or has trouble tolerating it.  Thank you for asking me to see Mrs. O'Keefe for a neurologic consultation. Please let me know if I can be of further assistance with her or other patients in the future.   Massai Hankerson A. Epimenio Foot, MD, PhD 02/01/2016, 10:12 AM Certified in Neurology, Clinical Neurophysiology, Sleep Medicine, Pain Medicine and Neuroimaging  [facility] [address] [place], [place] [address] [phone]

## 2016-02-01 NOTE — Telephone Encounter (Signed)
Patient called to advise, she will be approximately 10 minutes late, will arrive around 9:40am for 10:00am appointment this morning.

## 2016-02-05 ENCOUNTER — Telehealth: Payer: Self-pay | Admitting: *Deleted

## 2016-02-05 NOTE — Telephone Encounter (Signed)
------------------------------------------------------------   Caller Eldridge AbrahamsBRIAN FREEMAN-SEDGWICK     CID [id number]  Patient Cynthia Morrison, Cynthia Morrison       Pt's Dr Epimenio FootSATER        Area Code 866 Phone# [phone] * DOB [phone]      RE CONFIRM RECEIPT OF FAX REQUEST SENT ON 5 12       FOR CLINICAL NOTES TO BE SENT TO [phone]        Disp:Y/N N If Y = C/B If No Response In 20minutes ============================================================ Waiting on release from [place].

## 2016-02-13 ENCOUNTER — Telehealth: Payer: Self-pay | Admitting: *Deleted

## 2016-02-13 NOTE — Telephone Encounter (Signed)
Taken 24-MAY-17 at  9:57AM by RMS ------------------------------------------------------------ Gary Fleetaller Enaya OKEEFE                CID [id number]  Patient SAME                 Pt's Dr Epimenio FootSATER        Area Code 336 Phone# [phone] * DOB [phone]      RE CASEWORKER SAYS THEY NEED THE MEDICAL NOTES       FOR HER DISABILITY CASE FAX [phone] ATT BRIAN  Disp:Y/N   If Y = C/B If No Response In 20minutes ============================================================ Waiting on the release. ds

## 2016-02-15 ENCOUNTER — Telehealth: Payer: Self-pay | Admitting: *Deleted

## 2016-02-15 NOTE — Telephone Encounter (Signed)
Taken 26-MAY-17 at 10:18AM by [facility] ------------------------------------------------------------ Lenna SciaraCaller Cynthia Morrison               CID [id number]  Patient SAME                 Pt's Dr Epimenio FootSATER        Area Code 336 Phone# [phone] * DOB [phone]    RE CALLED THE OTHER DAY TO GET RECORDS SENT-STILL    WAITING AND ITS URGENT; PLEASE C/B                   Disp:Y/N N If Y = C/B If No Response In 20minutes ============================================================ Notes faxed on 02/15/16.

## 2016-03-14 ENCOUNTER — Ambulatory Visit: Payer: BLUE CROSS/BLUE SHIELD | Admitting: Neurology

## 2016-03-17 ENCOUNTER — Encounter: Payer: Self-pay | Admitting: Neurology

## 2018-04-01 DIAGNOSIS — F419 Anxiety disorder, unspecified: Secondary | ICD-10-CM | POA: Diagnosis not present

## 2018-04-01 DIAGNOSIS — I10 Essential (primary) hypertension: Secondary | ICD-10-CM | POA: Diagnosis not present

## 2019-05-19 DIAGNOSIS — I10 Essential (primary) hypertension: Secondary | ICD-10-CM | POA: Diagnosis not present

## 2019-06-01 DIAGNOSIS — T43615A Adverse effect of caffeine, initial encounter: Secondary | ICD-10-CM | POA: Diagnosis not present

## 2019-06-01 DIAGNOSIS — F419 Anxiety disorder, unspecified: Secondary | ICD-10-CM | POA: Diagnosis not present

## 2019-06-02 DIAGNOSIS — F418 Other specified anxiety disorders: Secondary | ICD-10-CM | POA: Diagnosis not present

## 2019-06-02 DIAGNOSIS — I10 Essential (primary) hypertension: Secondary | ICD-10-CM | POA: Diagnosis not present

## 2019-06-02 DIAGNOSIS — W19XXXD Unspecified fall, subsequent encounter: Secondary | ICD-10-CM | POA: Diagnosis not present

## 2019-06-02 DIAGNOSIS — R42 Dizziness and giddiness: Secondary | ICD-10-CM | POA: Diagnosis not present

## 2019-06-09 DIAGNOSIS — R55 Syncope and collapse: Secondary | ICD-10-CM | POA: Diagnosis not present

## 2019-06-09 DIAGNOSIS — E785 Hyperlipidemia, unspecified: Secondary | ICD-10-CM | POA: Diagnosis not present

## 2019-06-09 DIAGNOSIS — R42 Dizziness and giddiness: Secondary | ICD-10-CM | POA: Diagnosis not present

## 2019-06-09 DIAGNOSIS — F419 Anxiety disorder, unspecified: Secondary | ICD-10-CM | POA: Diagnosis not present

## 2019-06-09 DIAGNOSIS — F418 Other specified anxiety disorders: Secondary | ICD-10-CM | POA: Diagnosis not present

## 2019-06-09 DIAGNOSIS — Z79899 Other long term (current) drug therapy: Secondary | ICD-10-CM | POA: Diagnosis not present

## 2019-06-09 DIAGNOSIS — I10 Essential (primary) hypertension: Secondary | ICD-10-CM | POA: Diagnosis not present

## 2019-06-23 DIAGNOSIS — F418 Other specified anxiety disorders: Secondary | ICD-10-CM | POA: Diagnosis not present

## 2019-06-23 DIAGNOSIS — I10 Essential (primary) hypertension: Secondary | ICD-10-CM | POA: Diagnosis not present

## 2019-06-30 ENCOUNTER — Encounter: Payer: Self-pay | Admitting: Cardiology

## 2019-06-30 ENCOUNTER — Other Ambulatory Visit: Payer: Self-pay

## 2019-06-30 ENCOUNTER — Ambulatory Visit (INDEPENDENT_AMBULATORY_CARE_PROVIDER_SITE_OTHER): Payer: BC Managed Care – PPO | Admitting: Cardiology

## 2019-06-30 VITALS — BP 153/103 | HR 106 | Temp 97.2°F | Ht 61.0 in | Wt 138.0 lb

## 2019-06-30 DIAGNOSIS — R4020 Unspecified coma: Secondary | ICD-10-CM

## 2019-06-30 DIAGNOSIS — I10 Essential (primary) hypertension: Secondary | ICD-10-CM

## 2019-06-30 DIAGNOSIS — Z7189 Other specified counseling: Secondary | ICD-10-CM | POA: Diagnosis not present

## 2019-06-30 MED ORDER — AMLODIPINE BESYLATE 10 MG PO TABS: 5.0000 mg | ORAL_TABLET | Freq: Every day | ORAL | Status: DC

## 2019-06-30 MED ORDER — AMLODIPINE BESYLATE 5 MG PO TABS: 5.0000 mg | ORAL_TABLET | Freq: Every day | ORAL | Status: AC

## 2019-06-30 NOTE — Patient Instructions (Signed)
Medication Instructions:  Your Physician recommend you continue on your current medication as directed.    If you need a refill on your cardiac medications before your next appointment, please call your pharmacy.   Lab work: None  Testing/Procedures: None  Follow-Up: At [facility], you and your health needs are our priority.  As part of our continuing mission to provide you with exceptional heart care, we have created designated Provider Care Teams.  These Care Teams include your primary Cardiologist (physician) and Advanced Practice Providers (APPs -  Physician Assistants and Nurse Practitioners) who all work together to provide you with the care you need, when you need it. You will need a follow up appointment in 2 months.  Please call our office 2 months in advance to schedule this appointment.  You may see Dr. Christopher or one of the following Advanced Practice Providers on your designated Care Team:   Rhonda Barrett, PA-C . Kathryn Lawrence, DNP, ANP     

## 2019-06-30 NOTE — Progress Notes (Signed)
Cardiology Office Note:    Date:  06/30/2019   ID:  Cynthia Morrison, DOB 1956/05/16, MRN [id number]  PCP:  Hillery Aldo, PA-C  Cardiologist:  No primary care provider on file.  Referring MD: Juliet Rude*   CC: new patient consult for evaluation of dizzyness/syncope  History of Present Illness:    Cynthia Morrison is a 63 y.o. female with a hx of dizziness, hypertension, hyperlipidemia, syncope who is seen as a new consult at the request of Livengood, Jessica J, P* for the evaluation and management of dizziness/syncope.  Reviewed notes from recent visit 06/09/19 with Ernst Bowler, PA at [place] at [facility]. Continued dizziness, not changed with decrease in losartan. BP elevated to 170s/100s, so returned to prior dosing of 50 mg BID losartan.  Dizziness is spinning sensation, not lightheadedness. Worse with certain movements/positions. Given that her BP was still elevated, she was started on amlodipine 5 mg at that visit. ECG read as sinus tachycardia. BMET, TSH unremarkable. CBC normal except for elevated MCV without anemia. Lipids Tchol 1919, tg 128, hdl 80, LDL 85.  Today: Very concerned about fall/LOC episodes, reviewed below. Extremely anxious, physically shaking and tremulous today.  Denies dizziness since she cut out caffeine, none in at least two weeks. Endorsed room spinning/vertigo sensations during these events, not lightheadedness.   HTN:  In AM, 130s/80s, in the 140s systolic in the afternoon. Very nervous when taking BP.  Syncope: Initial episode: Occurred over the weekend of 05/28/19. She was at home, had two very large coffees, felt very nervous. Doesn't remember anything other than feeling jittery. At 1 o'clock, felt very "hyper." That is the last thing she clearly recalls. Woke up at 4:24 (looked at the clock), woke up in her bed with a bump on her head. Doesn't remember getting in bed or falling asleep. Was not confused, no nausea, no chest pain.  Denies neuro symptoms, but daughter endorsed that she had slurred speech even until the next day. Bump on her head was small, right temporal area. Has not had caffeine since.  Frequency: only once Duration of episodes: unclear, around 3 hours of lost time Presyncopal symptoms: none that she recalls Post syncope symptoms: she denies, daughter endorses slurred speech Aggravating/alleviating factors: caffeine more than usual (usually only green tea) Pre-existing medical conditions: HTN Potential medication/supplement interactions: no clear Prior workup: reported echo 2017 with LVH, G1DD per Deboraha Sprang notes (I cannot see results) ECG: NSR today Family history: father had heart disease in adulthood. No other that she knows of.  Denies chest pain, shortness of breath at rest or with normal exertion. No PND, orthopnea, LE edema or unexpected weight gain. No syncope or palpitations.   Past Medical History:  Diagnosis Date   Hypertension     Past Surgical History:  Procedure Laterality Date   BACK SURGERY     GANGLION CYST EXCISION Right    WISDOM TOOTH EXTRACTION      Current Medications: Current Outpatient Medications on File Prior to Visit  Medication Sig   amLODipine (NORVASC) 10 MG tablet Take 1 tablet (10 mg total) by mouth daily. (Patient taking differently: Take 5 mg by mouth daily. )   clonazePAM (KLONOPIN) 0.5 MG tablet Take 1 mg by mouth daily.    clonazePAM (KLONOPIN) 1 MG tablet TAKE 1 TABLET BY MOUTH ONCE DAILY FOR 30 DAYS   losartan (COZAAR) 50 MG tablet TAKE 1 TABLET BY MOUTH TWICE DAILY FOR 90 DAYS   aspirin 325 MG  tablet Take 325 mg by mouth daily as needed for mild pain or headache.   busPIRone (BUSPAR) 15 MG tablet Take 1 tablet (15 mg total) by mouth 2 (two) times daily. (Patient not taking: Reported on 06/30/2019)   hydrochlorothiazide (HYDRODIURIL) 12.5 MG tablet Take 1 tablet (12.5 mg total) by mouth daily. (Patient not taking: Reported on 06/30/2019)    ibuprofen (ADVIL,MOTRIN) 200 MG tablet Take 200 mg by mouth daily as needed for headache or moderate pain.   lisinopril (PRINIVIL,ZESTRIL) 40 MG tablet Take 40 mg by mouth 2 (two) times daily.   metoprolol succinate (TOPROL-XL) 50 MG 24 hr tablet Take 75 mg by mouth daily.   No current facility-administered medications on file prior to visit.      Allergies:   Codeine and Aleve [naproxen sodium]   Social History   Tobacco Use   Smoking status: Former Smoker  Substance Use Topics   Alcohol use: Yes   Drug use: No    Family History: family history includes Heart failure in her father; Hypertension in her mother; Pancreatic cancer in her father.  ROS:   Please see the history of present illness.  Additional pertinent ROS: Constitutional: Negative for chills, fever, night sweats, unintentional weight loss  HENT: Negative for ear pain and hearing loss.   Eyes: Negative for loss of vision and eye pain.  Respiratory: Negative for cough, sputum, wheezing.   Cardiovascular: See HPI. Gastrointestinal: Negative for abdominal pain, melena, and hematochezia.  Genitourinary: Negative for dysuria and hematuria.  Musculoskeletal: Negative for falls and myalgias.  Skin: Negative for itching and rash.  Neurological: Negative for focal weakness, focal sensory changes and loss of consciousness.  Endo/Heme/Allergies: Does not bruise/bleed easily.     EKGs/Labs/Other Studies Reviewed:    The following studies were reviewed today: Notes from PCP  EKG:  EKG is personally reviewed.  The ekg ordered today demonstrates NSR, rate 94 bpm  Recent Labs: No results found for requested labs within last 8760 hours.  Recent Lipid Panel No results found for: CHOL, TRIG, HDL, CHOLHDL, VLDL, LDLCALC, LDLDIRECT  Physical Exam:    VS:  Ht 5\' 1"  (1.549 m)    Wt 138 lb (62.6 kg)    BMI 26.07 kg/m    No data found.   Wt Readings from Last 3 Encounters:  06/30/19 138 lb (62.6 kg)  02/01/16 149 lb 8  oz (67.8 kg)    GEN: Well nourished, well developed in no acute distress HEENT: Normal, moist mucous membranes NECK: No JVD CARDIAC: regular rhythm, normal S1 and S2, no murmurs, rubs, gallops.  VASCULAR: Radial and DP pulses 2+ bilaterally. No carotid bruits RESPIRATORY:  Clear to auscultation without rales, wheezing or rhonchi  ABDOMEN: Soft, non-tender, non-distended MUSCULOSKELETAL:  Ambulates independently SKIN: Warm and dry, no edema NEUROLOGIC:  Alert and oriented x 3. No focal neuro deficits noted. PSYCHIATRIC:  Normal affect    ASSESSMENT:    1. Loss of consciousness (Christie)   2. Essential hypertension   3. Cardiac risk counseling   4. Counseling on health promotion and disease prevention    PLAN:    Loss of consciousness event: it is unclear to me what this was, and after speaking to patient and her daughter, unclear if it was true loss of consciousness. Has had what sounds like vertigo symptoms in the past, but no other syncope. ECG unremarkable.  -only change was high caffeine intake. She is now avoiding this -monitor for recurrence/symptoms. Would pursue monitor and echo  if it occurs again  Hypertension: elevated today, though she is extremely anxious during our visit. Reports good control at home -log BP at home, bring log and cuff into office at follow up. -will refill amlodipine 5 mg dose -continue losartan 50 mg BID -labs recently checked by PCP, Cr 0.83, K 4.3  Cardiac risk counseling and prevention recommendations: -recommend heart healthy/Mediterranean diet, with whole grains, fruits, vegetable, fish, lean meats, nuts, and olive oil. Limit salt. -recommend moderate walking, 3-5 times/week for 30-50 minutes each session. Aim for at least 150 minutes.week. Goal should be pace of 3 miles/hours, or walking 1.5 miles in 30 minutes -recommend avoidance of tobacco products. Avoid excess alcohol. -Additional risk factor control:  -Diabetes: A1c is not available, no DM  history  -Lipids: 05/2019: Tchol 191, HDL 80, LDL 85, TG 128  -Blood pressure control: as above  -Weight:  BMI 26  Plan for follow up: 2 mos  Medication Adjustments/Labs and Tests Ordered: Current medicines are reviewed at length with the patient today.  Concerns regarding medicines are outlined above.  Orders Placed This Encounter  Procedures   EKG 12-Lead   Meds ordered this encounter  Medications   DISCONTD: amLODipine (NORVASC) 10 MG tablet    Sig: Take 0.5 tablets (5 mg total) by mouth daily.   amLODipine (NORVASC) 5 MG tablet    Sig: Take 1 tablet (5 mg total) by mouth daily.    Patient Instructions  Medication Instructions:  Your Physician recommend you continue on your current medication as directed.    If you need a refill on your cardiac medications before your next appointment, please call your pharmacy.   Lab work: None  Testing/Procedures: None  Follow-Up: At [facility], you and your health needs are our priority.  As part of our continuing mission to provide you with exceptional heart care, we have created designated Provider Care Teams.  These Care Teams include your primary Cardiologist (physician) and Advanced Practice Providers (APPs -  Physician Assistants and Nurse Practitioners) who all work together to provide you with the care you need, when you need it. You will need a follow up appointment in 2 months.  Please call our office 2 months in advance to schedule this appointment.  You may see Dr. Cristal Deerhristopher or one of the following Advanced Practice Providers on your designated Care Team:   Theodore DemarkRhonda Barrett, PA-C  Joni ReiningKathryn Lawrence, DNP, ANP       Signed, Jodelle RedBridgette Zaide Kardell, MD PhD 06/30/2019 11:13 PM    [facility]

## 2019-07-06 ENCOUNTER — Encounter: Payer: Self-pay | Admitting: Cardiology

## 2019-07-21 DIAGNOSIS — F419 Anxiety disorder, unspecified: Secondary | ICD-10-CM | POA: Diagnosis not present

## 2019-07-21 DIAGNOSIS — I10 Essential (primary) hypertension: Secondary | ICD-10-CM | POA: Diagnosis not present

## 2019-08-09 DIAGNOSIS — F411 Generalized anxiety disorder: Secondary | ICD-10-CM | POA: Diagnosis not present

## 2019-09-02 ENCOUNTER — Ambulatory Visit: Payer: BC Managed Care – PPO | Admitting: Cardiology

## 2019-09-06 DIAGNOSIS — F419 Anxiety disorder, unspecified: Secondary | ICD-10-CM | POA: Diagnosis not present

## 2020-05-02 DIAGNOSIS — Z20822 Contact with and (suspected) exposure to covid-19: Secondary | ICD-10-CM | POA: Diagnosis not present

## 2020-05-03 DIAGNOSIS — Z20822 Contact with and (suspected) exposure to covid-19: Secondary | ICD-10-CM | POA: Diagnosis not present

## 2020-05-13 DIAGNOSIS — M62830 Muscle spasm of back: Secondary | ICD-10-CM | POA: Diagnosis not present

## 2020-05-13 DIAGNOSIS — I10 Essential (primary) hypertension: Secondary | ICD-10-CM | POA: Diagnosis not present

## 2020-05-13 DIAGNOSIS — M545 Low back pain: Secondary | ICD-10-CM | POA: Diagnosis not present

## 2020-05-21 DIAGNOSIS — M545 Low back pain: Secondary | ICD-10-CM | POA: Diagnosis not present

## 2020-05-29 DIAGNOSIS — M62830 Muscle spasm of back: Secondary | ICD-10-CM | POA: Diagnosis not present

## 2020-06-01 DIAGNOSIS — R293 Abnormal posture: Secondary | ICD-10-CM | POA: Diagnosis not present

## 2020-06-01 DIAGNOSIS — M2560 Stiffness of unspecified joint, not elsewhere classified: Secondary | ICD-10-CM | POA: Diagnosis not present

## 2020-06-01 DIAGNOSIS — R531 Weakness: Secondary | ICD-10-CM | POA: Diagnosis not present

## 2020-06-01 DIAGNOSIS — M545 Low back pain: Secondary | ICD-10-CM | POA: Diagnosis not present

## 2020-06-04 DIAGNOSIS — M545 Low back pain: Secondary | ICD-10-CM | POA: Diagnosis not present

## 2020-06-08 DIAGNOSIS — M2560 Stiffness of unspecified joint, not elsewhere classified: Secondary | ICD-10-CM | POA: Diagnosis not present

## 2020-06-08 DIAGNOSIS — R531 Weakness: Secondary | ICD-10-CM | POA: Diagnosis not present

## 2020-06-08 DIAGNOSIS — M545 Low back pain: Secondary | ICD-10-CM | POA: Diagnosis not present

## 2020-06-08 DIAGNOSIS — R293 Abnormal posture: Secondary | ICD-10-CM | POA: Diagnosis not present

## 2020-06-13 DIAGNOSIS — M5137 Other intervertebral disc degeneration, lumbosacral region: Secondary | ICD-10-CM | POA: Diagnosis not present

## 2020-06-13 DIAGNOSIS — M5136 Other intervertebral disc degeneration, lumbar region: Secondary | ICD-10-CM | POA: Diagnosis not present

## 2020-06-13 DIAGNOSIS — M545 Low back pain: Secondary | ICD-10-CM | POA: Diagnosis not present

## 2020-06-27 DIAGNOSIS — M259 Joint disorder, unspecified: Secondary | ICD-10-CM | POA: Diagnosis not present

## 2020-06-27 DIAGNOSIS — M4856XA Collapsed vertebra, not elsewhere classified, lumbar region, initial encounter for fracture: Secondary | ICD-10-CM | POA: Diagnosis not present

## 2020-06-27 DIAGNOSIS — M545 Low back pain, unspecified: Secondary | ICD-10-CM | POA: Diagnosis not present

## 2020-06-28 ENCOUNTER — Other Ambulatory Visit: Payer: Self-pay | Admitting: Orthopedic Surgery

## 2020-06-28 DIAGNOSIS — M259 Joint disorder, unspecified: Secondary | ICD-10-CM

## 2020-07-13 ENCOUNTER — Ambulatory Visit
Admission: RE | Admit: 2020-07-13 | Discharge: 2020-07-13 | Disposition: A | Discharge status: Home / Self Care | Payer: BC Managed Care – PPO | Source: Ambulatory Visit | Attending: Orthopedic Surgery | Admitting: Orthopedic Surgery

## 2020-07-13 ENCOUNTER — Other Ambulatory Visit: Payer: Self-pay

## 2020-07-13 DIAGNOSIS — M259 Joint disorder, unspecified: Secondary | ICD-10-CM

## 2020-07-13 DIAGNOSIS — M533 Sacrococcygeal disorders, not elsewhere classified: Secondary | ICD-10-CM | POA: Diagnosis not present

## 2020-07-27 DIAGNOSIS — M533 Sacrococcygeal disorders, not elsewhere classified: Secondary | ICD-10-CM | POA: Diagnosis not present

## 2020-09-04 DIAGNOSIS — H538 Other visual disturbances: Secondary | ICD-10-CM | POA: Diagnosis not present

## 2020-09-05 DIAGNOSIS — H04123 Dry eye syndrome of bilateral lacrimal glands: Secondary | ICD-10-CM | POA: Diagnosis not present

## 2020-09-05 DIAGNOSIS — H2513 Age-related nuclear cataract, bilateral: Secondary | ICD-10-CM | POA: Diagnosis not present

## 2020-09-05 DIAGNOSIS — H353131 Nonexudative age-related macular degeneration, bilateral, early dry stage: Secondary | ICD-10-CM | POA: Diagnosis not present

## 2020-09-05 DIAGNOSIS — D3131 Benign neoplasm of right choroid: Secondary | ICD-10-CM | POA: Diagnosis not present

## 2020-10-03 DIAGNOSIS — F411 Generalized anxiety disorder: Secondary | ICD-10-CM | POA: Diagnosis not present

## 2020-10-03 DIAGNOSIS — I10 Essential (primary) hypertension: Secondary | ICD-10-CM | POA: Diagnosis not present

## 2020-10-03 DIAGNOSIS — H269 Unspecified cataract: Secondary | ICD-10-CM | POA: Diagnosis not present

## 2020-10-05 DIAGNOSIS — H25812 Combined forms of age-related cataract, left eye: Secondary | ICD-10-CM | POA: Diagnosis not present

## 2021-02-13 DIAGNOSIS — M62838 Other muscle spasm: Secondary | ICD-10-CM | POA: Diagnosis not present

## 2021-02-21 DIAGNOSIS — N390 Urinary tract infection, site not specified: Secondary | ICD-10-CM | POA: Diagnosis not present

## 2021-02-21 DIAGNOSIS — M549 Dorsalgia, unspecified: Secondary | ICD-10-CM | POA: Diagnosis not present

## 2021-07-29 DIAGNOSIS — M549 Dorsalgia, unspecified: Secondary | ICD-10-CM | POA: Diagnosis not present

## 2021-07-29 DIAGNOSIS — R109 Unspecified abdominal pain: Secondary | ICD-10-CM | POA: Diagnosis not present

## 2021-08-05 DIAGNOSIS — R112 Nausea with vomiting, unspecified: Secondary | ICD-10-CM | POA: Diagnosis not present

## 2021-08-05 DIAGNOSIS — R252 Cramp and spasm: Secondary | ICD-10-CM | POA: Diagnosis not present

## 2021-08-05 DIAGNOSIS — R109 Unspecified abdominal pain: Secondary | ICD-10-CM | POA: Diagnosis not present

## 2021-08-05 DIAGNOSIS — Z6824 Body mass index (BMI) 24.0-24.9, adult: Secondary | ICD-10-CM | POA: Diagnosis not present

## 2021-12-13 ENCOUNTER — Ambulatory Visit (HOSPITAL_COMMUNITY)
Admission: EM | Admit: 2021-12-13 | Discharge: 2021-12-13 | Disposition: A | Discharge status: Home / Self Care | Payer: BC Managed Care – PPO | Attending: Nurse Practitioner | Admitting: Nurse Practitioner

## 2021-12-13 ENCOUNTER — Ambulatory Visit (INDEPENDENT_AMBULATORY_CARE_PROVIDER_SITE_OTHER): Payer: BC Managed Care – PPO

## 2021-12-13 ENCOUNTER — Other Ambulatory Visit: Payer: Self-pay

## 2021-12-13 ENCOUNTER — Encounter (HOSPITAL_COMMUNITY): Payer: Self-pay

## 2021-12-13 DIAGNOSIS — N3001 Acute cystitis with hematuria: Secondary | ICD-10-CM

## 2021-12-13 DIAGNOSIS — R109 Unspecified abdominal pain: Secondary | ICD-10-CM | POA: Diagnosis not present

## 2021-12-13 DIAGNOSIS — R1084 Generalized abdominal pain: Secondary | ICD-10-CM | POA: Diagnosis not present

## 2021-12-13 DIAGNOSIS — K5900 Constipation, unspecified: Secondary | ICD-10-CM | POA: Diagnosis not present

## 2021-12-13 DIAGNOSIS — N200 Calculus of kidney: Secondary | ICD-10-CM | POA: Diagnosis not present

## 2021-12-13 LAB — POCT URINALYSIS DIPSTICK, ED / UC
Bilirubin Urine: NEGATIVE
Glucose, UA: NEGATIVE mg/dL
Leukocytes,Ua: NEGATIVE
Nitrite: POSITIVE — AB
Protein, ur: 100 mg/dL — AB
Specific Gravity, Urine: >=1.03 (ref 1.005–1.030)
Urobilinogen, UA: 0.2 mg/dL (ref 0.0–1.0)
pH: 5 (ref 5.0–8.0)

## 2021-12-13 MED ORDER — ONDANSETRON 4 MG PO TBDP: 4.0000 mg | ORAL_TABLET | Freq: Three times a day (TID) | ORAL | 0 refills | Status: AC | PRN

## 2021-12-13 MED ORDER — CEPHALEXIN 500 MG PO CAPS: 500.0000 mg | ORAL_CAPSULE | Freq: Four times a day (QID) | ORAL | 0 refills | Status: DC

## 2021-12-13 MED ORDER — DOCUSATE SODIUM 100 MG PO CAPS: 100.0000 mg | ORAL_CAPSULE | Freq: Two times a day (BID) | ORAL | 0 refills | Status: AC

## 2021-12-13 MED ORDER — POLYETHYLENE GLYCOL 3350 17 G PO PACK: 17.0000 g | PACK | Freq: Every day | ORAL | 0 refills | Status: DC

## 2021-12-13 NOTE — Discharge Instructions (Addendum)
-   Please make sure you are drinking plenty of fluids  ?- You can use ondansetron 4 mg under your tongue every 8 hours for nausea ?- Please start the cephalexin for the possible urinary tract infection; we are sending the urine for a urine culture and will let you know Monday if we need to change antibiotics ?- Please also start Miralax daily and colace twice daily to help you have a bowel movement ?- If the bulge in your groin persists, please follow up with the general surgeon; number is below ?- If you start having fever, chills, or nausea/vomiting and you are unable to keep fluids down, please go to the Emergency Room. ?

## 2021-12-13 NOTE — ED Provider Notes (Signed)
?[facility] ? ? ? ?CSN: [id number] ?Arrival date & time: 12/13/21  5621 ? ? ?  ? ?History   ?Chief Complaint ?Chief Complaint  ?Patient presents with  ? Abdominal Pain  ? ? ?HPI ?Cynthia Morrison is a 66 y.o. female.  ? ?Patient presents to with daughter.  Patient reports abdominal pain starting this morning after a bowel movement.  She reports she went to work and later noticed a bulge in her groin and the pain was increasing, so she came in.  She has had some nausea and vomiting today, she thinks from the pain.  She denies any current nausea.  She denies fevers, chills, constipation, blood in her stool, heartburn, skin changes including rash, burning with urination or increased urinary frequency, and hematuria. ? ?She does use NSAIDs on a regular basis for her chronic pain.  She has not tried anything for the pain today.  She reports movement makes the pain worse. ? ? ?Past Medical History:  ?Diagnosis Date  ? Hypertension   ? ? ?Patient Active Problem List  ? Diagnosis Date Noted  ? Essential hypertension 02/01/2016  ? Lightheadedness 02/01/2016  ? Anxiety state 02/01/2016  ? ? ?Past Surgical History:  ?Procedure Laterality Date  ? BACK SURGERY    ? GANGLION CYST EXCISION Right   ? WISDOM TOOTH EXTRACTION    ? ? ?OB History   ?No obstetric history on file. ?  ? ? ? ?Home Medications   ? ?Prior to Admission medications   ?Medication Sig Start Date End Date Taking? Authorizing Provider  ?cephALEXin (KEFLEX) 500 MG capsule Take 1 capsule (500 mg total) by mouth 4 (four) times daily. 12/13/21  Yes Valentino Nose, NP  ?docusate sodium (COLACE) 100 MG capsule Take 1 capsule (100 mg total) by mouth every 12 (twelve) hours. 12/13/21  Yes Valentino Nose, NP  ?ondansetron (ZOFRAN-ODT) 4 MG disintegrating tablet Take 1 tablet (4 mg total) by mouth every 8 (eight) hours as needed for nausea or vomiting. 12/13/21  Yes Cathlean Marseilles A, NP  ?polyethylene glycol (MIRALAX) 17 g packet Take 17 g by mouth daily.  12/13/21  Yes Valentino Nose, NP  ?amLODipine (NORVASC) 5 MG tablet Take 1 tablet (5 mg total) by mouth daily. 06/30/19   Jodelle Red, MD  ?clonazePAM (KLONOPIN) 1 MG tablet Take 1 mg by mouth at bedtime as needed. 06/23/19   [provider]  ?losartan (COZAAR) 50 MG tablet Take 50 mg by mouth 2 (two) times daily. 06/23/19   [provider]  ? ? ?Family History ?Family History  ?Problem Relation Age of Onset  ? Hypertension Mother   ? Heart failure Father   ? Pancreatic cancer Father   ? ? ?Social History ?Social History  ? ?Tobacco Use  ? Smoking status: Former  ? Smokeless tobacco: Never  ?Substance Use Topics  ? Alcohol use: Yes  ? Drug use: No  ? ? ? ?Allergies   ?Codeine and Aleve [naproxen sodium] ? ? ?Review of Systems ?Review of Systems ?Per HPI ? ?Physical Exam ?Triage Vital Signs ?ED Triage Vitals  ?Enc Vitals Group  ?   BP 12/13/21 1036 (!) 152/78  ?   Pulse Rate 12/13/21 1036 (!) 106  ?   Resp 12/13/21 1036 20  ?   Temp 12/13/21 1036 98.2 ?F (36.8 ?C)  ?   Temp Source 12/13/21 1036 Oral  ?   SpO2 12/13/21 1036 97 %  ?   Weight --   ?  Height --   ?   Head Circumference --   ?   Peak Flow --   ?   Pain Score 12/13/21 1037 9  ?   Pain Loc --   ?   Pain Edu? --   ?   Excl. in GC? --   ? ?No data found. ? ?Updated Vital Signs ?BP (!) 152/78 (BP Location: Left Arm)   Pulse (!) 106   Temp 98.2 ?F (36.8 ?C) (Oral)   Resp 20   SpO2 97%  ? ?Visual Acuity ?Right Eye Distance:   ?Left Eye Distance:   ?Bilateral Distance:   ? ?Right Eye Near:   ?Left Eye Near:    ?Bilateral Near:    ? ?Physical Exam ?Vitals and nursing note reviewed.  ?Constitutional:   ?   General: She is not in acute distress. ?   Appearance: She is well-developed. She is not toxic-appearing.  ?HENT:  ?   Head: Normocephalic and atraumatic.  ?Eyes:  ?   General: No scleral icterus. ?Cardiovascular:  ?   Rate and Rhythm: Normal rate and regular rhythm.  ?Pulmonary:  ?   Effort: Pulmonary effort is normal. No  respiratory distress.  ?   Breath sounds: Normal breath sounds. No wheezing, rhonchi or rales.  ?Abdominal:  ?   General: Abdomen is flat. Bowel sounds are normal.  ?   Palpations: Abdomen is soft.  ?   Tenderness: There is no abdominal tenderness.  ?   Hernia: A hernia is present. Hernia is present in the right inguinal area.  ?Skin: ?   General: Skin is warm and dry.  ?   Capillary Refill: Capillary refill takes less than 2 seconds.  ?   Coloration: Skin is not cyanotic, jaundiced or pale.  ?Neurological:  ?   Mental Status: She is alert and oriented to person, place, and time.  ?Psychiatric:     ?   Mood and Affect: Mood is anxious.  ? ? ? ?UC Treatments / Results  ?Labs ?(all labs ordered are listed, but only abnormal results are displayed) ?Labs Reviewed  ?POCT URINALYSIS DIPSTICK, ED / UC - Abnormal; Notable for the following components:  ?    Result Value  ? Ketones, ur TRACE (*)   ? Hgb urine dipstick TRACE (*)   ? Protein, ur 100 (*)   ? Nitrite POSITIVE (*)   ? All other components within normal limits  ?URINE CULTURE  ? ? ?EKG ? ? ?Radiology ?DG Abd 2 Views ? ?Result Date: 12/13/2021 ?CLINICAL DATA:  Abdominal pain.  Bulging groin. EXAM: ABDOMEN - 2 VIEW COMPARISON:  None. FINDINGS: Normal bowel gas pattern. No evidence for free air. Moderate amount of stool particularly in the rectum. Possible small left renal calculi. Degenerative changes along the left side of the upper lumbar spine. IMPRESSION: 1. Normal bowel gas pattern. 2. Moderate stool burden. 3. Possible left renal calculi. Electronically Signed   By: Richarda OverlieAdam  Henn M.D.   On: 12/13/2021 11:23   ? ?Procedures ?Procedures (including critical care time) ? ?Medications Ordered in UC ?Medications - No data to display ? ?Initial Impression / Assessment and Plan / UC Course  ?I have reviewed the triage vital signs and the nursing notes. ? ?Pertinent labs & imaging results that were available during my care of the patient were reviewed by me and considered  in my medical decision making (see chart for details). ? ?  ?Urinalysis today shows trace ketones, trace hemoglobin, protein, and  nitrites.  Query acute cystitis vs. kidney stone.  Additionally, abdominal x-ray shows possible left renal calculi.  There is also moderate stool burden on x-ray.  Suspect cystitis coupled with stool burden may be cause of abdominal.  Will treat acute cystitis with cephalexin.  Start bowel regimen to help empty colon.  Can use ondansetron 4 mg every 8 hours as needed for nausea.  Push fluids and eat soft foods.  If symptoms persist and do not improve, follow-up with general surgeon for possible hernia.  Contact information provided.  If symptoms worsen, go to emergency room. ?Final Clinical Impressions(s) / UC Diagnoses  ? ?Final diagnoses:  ?Generalized abdominal pain  ?Acute cystitis with hematuria  ?Constipation, unspecified constipation type  ? ? ? ?Discharge Instructions   ? ?  ?- Please make sure you are drinking plenty of fluids  ?- You can use ondansetron 4 mg under your tongue every 8 hours for nausea ?- Please start the cephalexin for the possible urinary tract infection; we are sending the urine for a urine culture and will let you know Monday if we need to change antibiotics ?- Please also start Miralax daily and colace twice daily to help you have a bowel movement ?- If the bulge in your groin persists, please follow up with the general surgeon; number is below ?- If you start having fever, chills, or nausea/vomiting and you are unable to keep fluids down, please go to the Emergency Room. ? ? ? ? ?ED Prescriptions   ? ? Medication Sig Dispense Auth. Provider  ? docusate sodium (COLACE) 100 MG capsule Take 1 capsule (100 mg total) by mouth every 12 (twelve) hours. 30 capsule Cathlean Marseilles A, NP  ? polyethylene glycol (MIRALAX) 17 g packet Take 17 g by mouth daily. 14 each Cathlean Marseilles A, NP  ? cephALEXin (KEFLEX) 500 MG capsule Take 1 capsule (500 mg total) by mouth 4  (four) times daily. 20 capsule Cathlean Marseilles A, NP  ? ondansetron (ZOFRAN-ODT) 4 MG disintegrating tablet Take 1 tablet (4 mg total) by mouth every 8 (eight) hours as needed for nausea or vomiting. 20 tab

## 2021-12-13 NOTE — ED Triage Notes (Signed)
Pt presents today with abdominal pain. Pt was able to have a BM today. Pt states a lump on the pelvic area. Pt denies any discharge from pelvic region. States abdominal pain as pins and needles stabbing her. ?

## 2021-12-16 DIAGNOSIS — N390 Urinary tract infection, site not specified: Secondary | ICD-10-CM | POA: Diagnosis not present

## 2021-12-16 DIAGNOSIS — R112 Nausea with vomiting, unspecified: Secondary | ICD-10-CM | POA: Diagnosis not present

## 2021-12-16 LAB — URINE CULTURE: Culture: >=100000 — AB

## 2021-12-17 ENCOUNTER — Other Ambulatory Visit: Payer: Self-pay

## 2021-12-17 ENCOUNTER — Inpatient Hospital Stay (HOSPITAL_COMMUNITY): Payer: BC Managed Care – PPO

## 2021-12-17 ENCOUNTER — Encounter (HOSPITAL_COMMUNITY): Payer: Self-pay | Admitting: Pharmacy Technician

## 2021-12-17 ENCOUNTER — Emergency Department (HOSPITAL_COMMUNITY): Payer: BC Managed Care – PPO

## 2021-12-17 ENCOUNTER — Inpatient Hospital Stay (HOSPITAL_COMMUNITY): Payer: BC Managed Care – PPO | Admitting: Anesthesiology

## 2021-12-17 ENCOUNTER — Telehealth: Payer: Self-pay | Admitting: Cardiology

## 2021-12-17 ENCOUNTER — Inpatient Hospital Stay (HOSPITAL_COMMUNITY)
Admission: EM | Admit: 2021-12-17 | Discharge: 2021-12-26 | DRG: 329 | Disposition: A | Discharge status: Home / Self Care | Payer: BC Managed Care – PPO | Attending: Internal Medicine | Admitting: Internal Medicine

## 2021-12-17 ENCOUNTER — Encounter (HOSPITAL_COMMUNITY)
Admission: EM | Disposition: A | Discharge status: Home / Self Care | Payer: Self-pay | Source: Home / Self Care | Attending: Family Medicine

## 2021-12-17 DIAGNOSIS — I10 Essential (primary) hypertension: Secondary | ICD-10-CM | POA: Diagnosis present

## 2021-12-17 DIAGNOSIS — Z882 Allergy status to sulfonamides status: Secondary | ICD-10-CM

## 2021-12-17 DIAGNOSIS — B9620 Unspecified Escherichia coli [E. coli] as the cause of diseases classified elsewhere: Secondary | ICD-10-CM | POA: Diagnosis present

## 2021-12-17 DIAGNOSIS — K651 Peritoneal abscess: Secondary | ICD-10-CM | POA: Diagnosis not present

## 2021-12-17 DIAGNOSIS — E162 Hypoglycemia, unspecified: Secondary | ICD-10-CM | POA: Diagnosis not present

## 2021-12-17 DIAGNOSIS — Z8249 Family history of ischemic heart disease and other diseases of the circulatory system: Secondary | ICD-10-CM | POA: Diagnosis not present

## 2021-12-17 DIAGNOSIS — N390 Urinary tract infection, site not specified: Secondary | ICD-10-CM | POA: Diagnosis present

## 2021-12-17 DIAGNOSIS — E861 Hypovolemia: Secondary | ICD-10-CM | POA: Diagnosis present

## 2021-12-17 DIAGNOSIS — K55019 Acute (reversible) ischemia of small intestine, extent unspecified: Secondary | ICD-10-CM | POA: Diagnosis not present

## 2021-12-17 DIAGNOSIS — R000 Tachycardia, unspecified: Secondary | ICD-10-CM | POA: Diagnosis not present

## 2021-12-17 DIAGNOSIS — A09 Infectious gastroenteritis and colitis, unspecified: Secondary | ICD-10-CM | POA: Diagnosis present

## 2021-12-17 DIAGNOSIS — E876 Hypokalemia: Secondary | ICD-10-CM | POA: Diagnosis not present

## 2021-12-17 DIAGNOSIS — K4130 Unilateral femoral hernia, with obstruction, without gangrene, not specified as recurrent: Principal | ICD-10-CM | POA: Diagnosis present

## 2021-12-17 DIAGNOSIS — Z87891 Personal history of nicotine dependence: Secondary | ICD-10-CM | POA: Diagnosis not present

## 2021-12-17 DIAGNOSIS — E870 Hyperosmolality and hypernatremia: Secondary | ICD-10-CM | POA: Diagnosis not present

## 2021-12-17 DIAGNOSIS — E860 Dehydration: Secondary | ICD-10-CM | POA: Diagnosis present

## 2021-12-17 DIAGNOSIS — E871 Hypo-osmolality and hyponatremia: Secondary | ICD-10-CM | POA: Diagnosis not present

## 2021-12-17 DIAGNOSIS — I959 Hypotension, unspecified: Secondary | ICD-10-CM | POA: Diagnosis not present

## 2021-12-17 DIAGNOSIS — J189 Pneumonia, unspecified organism: Secondary | ICD-10-CM | POA: Diagnosis not present

## 2021-12-17 DIAGNOSIS — K6389 Other specified diseases of intestine: Secondary | ICD-10-CM | POA: Diagnosis not present

## 2021-12-17 DIAGNOSIS — F32A Depression, unspecified: Secondary | ICD-10-CM | POA: Diagnosis present

## 2021-12-17 DIAGNOSIS — K559 Vascular disorder of intestine, unspecified: Secondary | ICD-10-CM | POA: Diagnosis present

## 2021-12-17 DIAGNOSIS — K5730 Diverticulosis of large intestine without perforation or abscess without bleeding: Secondary | ICD-10-CM | POA: Diagnosis not present

## 2021-12-17 DIAGNOSIS — Z800 Family history of malignant neoplasm of digestive organs: Secondary | ICD-10-CM | POA: Diagnosis not present

## 2021-12-17 DIAGNOSIS — F419 Anxiety disorder, unspecified: Secondary | ICD-10-CM | POA: Diagnosis present

## 2021-12-17 DIAGNOSIS — K3189 Other diseases of stomach and duodenum: Secondary | ICD-10-CM | POA: Diagnosis not present

## 2021-12-17 DIAGNOSIS — N179 Acute kidney failure, unspecified: Secondary | ICD-10-CM | POA: Diagnosis not present

## 2021-12-17 DIAGNOSIS — N170 Acute kidney failure with tubular necrosis: Secondary | ICD-10-CM | POA: Diagnosis not present

## 2021-12-17 DIAGNOSIS — K5939 Other megacolon: Secondary | ICD-10-CM | POA: Diagnosis not present

## 2021-12-17 DIAGNOSIS — Z886 Allergy status to analgesic agent status: Secondary | ICD-10-CM

## 2021-12-17 DIAGNOSIS — K55029 Acute infarction of small intestine, extent unspecified: Secondary | ICD-10-CM | POA: Diagnosis not present

## 2021-12-17 DIAGNOSIS — Z20822 Contact with and (suspected) exposure to covid-19: Secondary | ICD-10-CM | POA: Diagnosis not present

## 2021-12-17 DIAGNOSIS — N739 Female pelvic inflammatory disease, unspecified: Secondary | ICD-10-CM | POA: Diagnosis present

## 2021-12-17 DIAGNOSIS — B952 Enterococcus as the cause of diseases classified elsewhere: Secondary | ICD-10-CM | POA: Diagnosis not present

## 2021-12-17 DIAGNOSIS — Y838 Other surgical procedures as the cause of abnormal reaction of the patient, or of later complication, without mention of misadventure at the time of the procedure: Secondary | ICD-10-CM | POA: Diagnosis not present

## 2021-12-17 DIAGNOSIS — A419 Sepsis, unspecified organism: Secondary | ICD-10-CM | POA: Diagnosis not present

## 2021-12-17 DIAGNOSIS — Z888 Allergy status to other drugs, medicaments and biological substances status: Secondary | ICD-10-CM

## 2021-12-17 DIAGNOSIS — N289 Disorder of kidney and ureter, unspecified: Secondary | ICD-10-CM | POA: Diagnosis not present

## 2021-12-17 DIAGNOSIS — Z885 Allergy status to narcotic agent status: Secondary | ICD-10-CM

## 2021-12-17 DIAGNOSIS — T8143XA Infection following a procedure, organ and space surgical site, initial encounter: Secondary | ICD-10-CM | POA: Diagnosis not present

## 2021-12-17 DIAGNOSIS — Z4682 Encounter for fitting and adjustment of non-vascular catheter: Secondary | ICD-10-CM | POA: Diagnosis not present

## 2021-12-17 HISTORY — PX: INGUINAL HERNIA REPAIR: SHX194

## 2021-12-17 LAB — CBC WITH DIFFERENTIAL/PLATELET
Abs Immature Granulocytes: 0.06 10*3/uL (ref 0.00–0.07)
Basophils Absolute: 0 10*3/uL (ref 0.0–0.1)
Basophils Relative: 0 %
Eosinophils Absolute: 0 10*3/uL (ref 0.0–0.5)
Eosinophils Relative: 0 %
HCT: 53.5 % — ABNORMAL HIGH (ref 36.0–46.0)
Hemoglobin: 18.4 g/dL — ABNORMAL HIGH (ref 12.0–15.0)
Immature Granulocytes: 1 %
Lymphocytes Relative: 12 %
Lymphs Abs: 1.2 10*3/uL (ref 0.7–4.0)
MCH: 34.1 pg — ABNORMAL HIGH (ref 26.0–34.0)
MCHC: 34.4 g/dL (ref 30.0–36.0)
MCV: 99.3 fL (ref 80.0–100.0)
Monocytes Absolute: 1.4 10*3/uL — ABNORMAL HIGH (ref 0.1–1.0)
Monocytes Relative: 13 %
Neutro Abs: 7.8 10*3/uL — ABNORMAL HIGH (ref 1.7–7.7)
Neutrophils Relative %: 74 %
Platelets: 279 10*3/uL (ref 150–400)
RBC: 5.39 MIL/uL — ABNORMAL HIGH (ref 3.87–5.11)
RDW: 13.2 % (ref 11.5–15.5)
WBC: 10.5 10*3/uL (ref 4.0–10.5)
nRBC: 0 % (ref 0.0–0.2)

## 2021-12-17 LAB — LACTIC ACID, PLASMA
Lactic Acid, Venous: 6.1 mmol/L (ref 0.5–1.9)
Lactic Acid, Venous: 1.9 mmol/L (ref 0.5–1.9)

## 2021-12-17 LAB — COMPREHENSIVE METABOLIC PANEL
ALT: 39 U/L (ref 0–44)
AST: 38 U/L (ref 15–41)
Albumin: 2.8 g/dL — ABNORMAL LOW (ref 3.5–5.0)
Alkaline Phosphatase: 60 U/L (ref 38–126)
Anion gap: 22 — ABNORMAL HIGH (ref 5–15)
BUN: 64 mg/dL — ABNORMAL HIGH (ref 8–23)
CO2: 32 mmol/L (ref 22–32)
Calcium: 7.9 mg/dL — ABNORMAL LOW (ref 8.9–10.3)
Chloride: 78 mmol/L — ABNORMAL LOW (ref 98–111)
Creatinine, Ser: 5.36 mg/dL — ABNORMAL HIGH (ref 0.44–1.00)
GFR, Estimated: 8 mL/min — ABNORMAL LOW (ref >60)
Glucose, Bld: 107 mg/dL — ABNORMAL HIGH (ref 70–99)
Potassium: 2.9 mmol/L — ABNORMAL LOW (ref 3.5–5.1)
Sodium: 132 mmol/L — ABNORMAL LOW (ref 135–145)
Total Bilirubin: 0.4 mg/dL (ref 0.3–1.2)
Total Protein: 5.9 g/dL — ABNORMAL LOW (ref 6.5–8.1)

## 2021-12-17 LAB — RESP PANEL BY RT-PCR (FLU A&B, COVID) ARPGX2
Influenza A by PCR: NEGATIVE
Influenza B by PCR: NEGATIVE
SARS Coronavirus 2 by RT PCR: NEGATIVE

## 2021-12-17 LAB — PROTIME-INR
INR: 1.1 (ref 0.8–1.2)
Prothrombin Time: 13.7 seconds (ref 11.4–15.2)

## 2021-12-17 LAB — APTT: aPTT: 23 seconds — ABNORMAL LOW (ref 24–36)

## 2021-12-17 SURGERY — REPAIR, HERNIA, INGUINAL, LAPAROSCOPIC
Anesthesia: General | Site: Abdomen | Laterality: Right

## 2021-12-17 MED ORDER — BUPIVACAINE HCL (PF) 0.25 % IJ SOLN
INTRAMUSCULAR | Status: AC
  Filled 2021-12-17: qty 30

## 2021-12-17 MED ORDER — ORAL CARE MOUTH RINSE: 15.0000 mL | Freq: Once | OROMUCOSAL | Status: DC

## 2021-12-17 MED ORDER — PROPOFOL 10 MG/ML IV BOLUS
INTRAVENOUS | Status: AC
  Filled 2021-12-17: qty 20

## 2021-12-17 MED ORDER — LACTATED RINGERS IV SOLN: INTRAVENOUS | Status: DC

## 2021-12-17 MED ORDER — DEXAMETHASONE SODIUM PHOSPHATE 4 MG/ML IJ SOLN
INTRAMUSCULAR | Status: DC | PRN
  Administered 2021-12-17: 5 mg via INTRAVENOUS

## 2021-12-17 MED ORDER — PHENYLEPHRINE HCL (PRESSORS) 10 MG/ML IV SOLN
INTRAVENOUS | Status: DC | PRN
  Administered 2021-12-17: 160 ug via INTRAVENOUS

## 2021-12-17 MED ORDER — DEXTROSE-NACL 5-0.9 % IV SOLN: INTRAVENOUS | Status: DC

## 2021-12-17 MED ORDER — FENTANYL CITRATE (PF) 250 MCG/5ML IJ SOLN
INTRAMUSCULAR | Status: AC
  Filled 2021-12-17: qty 5

## 2021-12-17 MED ORDER — METRONIDAZOLE 500 MG/100ML IV SOLN
500.0000 mg | Freq: Once | INTRAVENOUS | Status: AC
  Filled 2021-12-17: qty 100

## 2021-12-17 MED ORDER — BUPIVACAINE-EPINEPHRINE (PF) 0.25% -1:200000 IJ SOLN
INTRAMUSCULAR | Status: AC
  Filled 2021-12-17: qty 30

## 2021-12-17 MED ORDER — SODIUM CHLORIDE 0.9 % IV SOLN: INTRAVENOUS | Status: DC | PRN

## 2021-12-17 MED ORDER — HEPARIN SODIUM (PORCINE) 5000 UNIT/ML IJ SOLN: 5000.0000 [IU] | Freq: Three times a day (TID) | INTRAMUSCULAR | Status: DC

## 2021-12-17 MED ORDER — ENOXAPARIN SODIUM 30 MG/0.3ML IJ SOSY
30.0000 mg | PREFILLED_SYRINGE | INTRAMUSCULAR | Status: DC
  Administered 2021-12-18 – 2021-12-21 (×4): 30 mg via SUBCUTANEOUS
  Filled 2021-12-17 (×4): qty 0.3

## 2021-12-17 MED ORDER — MIDAZOLAM HCL 2 MG/2ML IJ SOLN
INTRAMUSCULAR | Status: AC
  Filled 2021-12-17: qty 2

## 2021-12-17 MED ORDER — HYDROMORPHONE HCL 1 MG/ML IJ SOLN
1.0000 mg | INTRAMUSCULAR | Status: DC | PRN
  Administered 2021-12-17 – 2021-12-19 (×6): 1 mg via INTRAVENOUS
  Filled 2021-12-17 (×6): qty 1

## 2021-12-17 MED ORDER — SODIUM CHLORIDE 0.9 % IV SOLN
2.0000 g | Freq: Once | INTRAVENOUS | Status: AC
  Administered 2021-12-18: 2 g via INTRAVENOUS
  Filled 2021-12-17: qty 2

## 2021-12-17 MED ORDER — 0.9 % SODIUM CHLORIDE (POUR BTL) OPTIME
TOPICAL | Status: DC | PRN
  Administered 2021-12-17: 1000 mL

## 2021-12-17 MED ORDER — PHENYLEPHRINE HCL-NACL 20-0.9 MG/250ML-% IV SOLN
INTRAVENOUS | Status: DC | PRN
  Administered 2021-12-17: 30 ug/min via INTRAVENOUS

## 2021-12-17 MED ORDER — SODIUM CHLORIDE 0.9 % IV SOLN: INTRAVENOUS | Status: DC

## 2021-12-17 MED ORDER — FENTANYL CITRATE (PF) 100 MCG/2ML IJ SOLN
INTRAMUSCULAR | Status: AC
  Filled 2021-12-17: qty 2

## 2021-12-17 MED ORDER — LACTATED RINGERS IV BOLUS (SEPSIS)
250.0000 mL | Freq: Once | INTRAVENOUS | Status: AC
  Administered 2021-12-17: 250 mL via INTRAVENOUS

## 2021-12-17 MED ORDER — MIDAZOLAM HCL 5 MG/5ML IJ SOLN
INTRAMUSCULAR | Status: DC | PRN
Start: 2021-12-17 — End: 2021-12-17
  Administered 2021-12-17 (×2): 1 mg via INTRAVENOUS

## 2021-12-17 MED ORDER — CHLORHEXIDINE GLUCONATE 0.12 % MT SOLN: 15.0000 mL | Freq: Once | OROMUCOSAL | Status: DC

## 2021-12-17 MED ORDER — PROPOFOL 10 MG/ML IV BOLUS
INTRAVENOUS | Status: DC | PRN
  Administered 2021-12-17: 150 mg via INTRAVENOUS

## 2021-12-17 MED ORDER — LACTATED RINGERS IV SOLN: INTRAVENOUS | Status: DC | PRN

## 2021-12-17 MED ORDER — HYDROMORPHONE HCL 1 MG/ML IJ SOLN: 0.5000 mg | INTRAMUSCULAR | Status: DC | PRN

## 2021-12-17 MED ORDER — LACTATED RINGERS IV BOLUS (SEPSIS)
500.0000 mL | Freq: Once | INTRAVENOUS | Status: AC
  Administered 2021-12-17: 500 mL via INTRAVENOUS

## 2021-12-17 MED ORDER — BUPIVACAINE-EPINEPHRINE 0.25% -1:200000 IJ SOLN
INTRAMUSCULAR | Status: DC | PRN
  Administered 2021-12-17: 6 mL

## 2021-12-17 MED ORDER — METRONIDAZOLE 500 MG/100ML IV SOLN
INTRAVENOUS | Status: DC | PRN
  Administered 2021-12-17: 500 mg via INTRAVENOUS

## 2021-12-17 MED ORDER — CHLORHEXIDINE GLUCONATE 0.12 % MT SOLN
OROMUCOSAL | Status: AC
  Filled 2021-12-17: qty 15

## 2021-12-17 MED ORDER — SODIUM CHLORIDE 0.9 % IV SOLN
2.0000 g | INTRAVENOUS | Status: AC
  Administered 2021-12-17 – 2021-12-23 (×7): 2 g via INTRAVENOUS
  Filled 2021-12-17 (×7): qty 20

## 2021-12-17 MED ORDER — SUGAMMADEX SODIUM 200 MG/2ML IV SOLN
INTRAVENOUS | Status: DC | PRN
  Administered 2021-12-17: 200 mg via INTRAVENOUS

## 2021-12-17 MED ORDER — LACTATED RINGERS IV BOLUS (SEPSIS)
1000.0000 mL | Freq: Once | INTRAVENOUS | Status: AC
  Administered 2021-12-17: 1000 mL via INTRAVENOUS

## 2021-12-17 MED ORDER — FENTANYL CITRATE (PF) 100 MCG/2ML IJ SOLN
INTRAMUSCULAR | Status: DC | PRN
Start: 2021-12-17 — End: 2021-12-17
  Administered 2021-12-17: 50 ug via INTRAVENOUS
  Administered 2021-12-17: 200 ug via INTRAVENOUS

## 2021-12-17 MED ORDER — ONDANSETRON HCL 4 MG/2ML IJ SOLN: 4.0000 mg | Freq: Four times a day (QID) | INTRAMUSCULAR | Status: DC | PRN

## 2021-12-17 MED ORDER — ROCURONIUM BROMIDE 100 MG/10ML IV SOLN
INTRAVENOUS | Status: DC | PRN
  Administered 2021-12-17: 40 mg via INTRAVENOUS

## 2021-12-17 MED ORDER — ONDANSETRON 4 MG PO TBDP: 4.0000 mg | ORAL_TABLET | Freq: Four times a day (QID) | ORAL | Status: DC | PRN

## 2021-12-17 MED ORDER — LIDOCAINE HCL (CARDIAC) PF 50 MG/5ML IV SOSY
PREFILLED_SYRINGE | INTRAVENOUS | Status: DC | PRN
  Administered 2021-12-17: 60 mg via INTRAVENOUS

## 2021-12-17 MED ORDER — ONDANSETRON HCL 4 MG/2ML IJ SOLN
4.0000 mg | Freq: Once | INTRAMUSCULAR | Status: AC
  Administered 2021-12-17: 4 mg via INTRAVENOUS
  Filled 2021-12-17: qty 2

## 2021-12-17 MED ORDER — SODIUM CHLORIDE 0.9 % IR SOLN
Status: DC | PRN
  Administered 2021-12-17: 1000 mL

## 2021-12-17 MED ORDER — SUCCINYLCHOLINE CHLORIDE 200 MG/10ML IV SOSY
PREFILLED_SYRINGE | INTRAVENOUS | Status: DC | PRN
  Administered 2021-12-17: 120 mg via INTRAVENOUS

## 2021-12-17 MED ORDER — POTASSIUM CHLORIDE 10 MEQ/100ML IV SOLN
10.0000 meq | Freq: Once | INTRAVENOUS | Status: AC
  Administered 2021-12-17: 10 meq via INTRAVENOUS
  Filled 2021-12-17: qty 100

## 2021-12-17 MED ORDER — ALBUMIN HUMAN 5 % IV SOLN: INTRAVENOUS | Status: DC | PRN

## 2021-12-17 MED ORDER — FENTANYL CITRATE (PF) 100 MCG/2ML IJ SOLN
25.0000 ug | INTRAMUSCULAR | Status: DC | PRN
  Administered 2021-12-17: 50 ug via INTRAVENOUS

## 2021-12-17 MED ORDER — ONDANSETRON HCL 4 MG/2ML IJ SOLN
INTRAMUSCULAR | Status: DC | PRN
  Administered 2021-12-17: 4 mg via INTRAVENOUS

## 2021-12-17 SURGICAL SUPPLY — 79 items
ADH SKN CLS APL DERMABOND .7 (GAUZE/BANDAGES/DRESSINGS) ×1
APL PRP STRL LF DISP 70% ISPRP (MISCELLANEOUS) ×1
BAG COUNTER SPONGE SURGICOUNT (BAG) ×2 IMPLANT
BAG SPNG CNTER NS LX DISP (BAG) ×1
BLADE CLIPPER SURG (BLADE) IMPLANT
CANISTER SUCT 3000ML PPV (MISCELLANEOUS) ×1 IMPLANT
CHLORAPREP W/TINT 26 (MISCELLANEOUS) ×2 IMPLANT
COVER SURGICAL LIGHT HANDLE (MISCELLANEOUS) ×2 IMPLANT
DERMABOND ADVANCED (GAUZE/BANDAGES/DRESSINGS) ×1
DERMABOND ADVANCED .7 DNX12 (GAUZE/BANDAGES/DRESSINGS) ×1 IMPLANT
DISSECTOR BLUNT TIP ENDO 5MM (MISCELLANEOUS) IMPLANT
DRAIN PENROSE 1/2X12 LTX STRL (WOUND CARE) IMPLANT
DRAPE LAPAROSCOPIC ABDOMINAL (DRAPES) ×1 IMPLANT
DRSG OPSITE POSTOP 4X8 (GAUZE/BANDAGES/DRESSINGS) ×1 IMPLANT
ELECT REM PT RETURN 9FT ADLT (ELECTROSURGICAL) ×2
ELECTRODE REM PT RTRN 9FT ADLT (ELECTROSURGICAL) ×1 IMPLANT
ENDOLOOP SUT PDS II  0 18 (SUTURE) ×2
ENDOLOOP SUT PDS II 0 18 (SUTURE) IMPLANT
GAUZE 4X4 16PLY ~~LOC~~+RFID DBL (SPONGE) ×2 IMPLANT
GLOVE SRG 8 PF TXTR STRL LF DI (GLOVE) ×1 IMPLANT
GLOVE SURG SYN 7.5  E (GLOVE) ×2
GLOVE SURG SYN 7.5 E (GLOVE) ×1 IMPLANT
GLOVE SURG SYN 7.5 PF PI (GLOVE) ×1 IMPLANT
GLOVE SURG UNDER POLY LF SZ8 (GLOVE) ×2
GOWN STRL REUS W/ TWL LRG LVL3 (GOWN DISPOSABLE) ×2 IMPLANT
GOWN STRL REUS W/ TWL XL LVL3 (GOWN DISPOSABLE) ×1 IMPLANT
GOWN STRL REUS W/TWL LRG LVL3 (GOWN DISPOSABLE) ×4
GOWN STRL REUS W/TWL XL LVL3 (GOWN DISPOSABLE) ×2
KIT BASIN OR (CUSTOM PROCEDURE TRAY) ×2 IMPLANT
KIT TURNOVER KIT B (KITS) ×2 IMPLANT
MESH 3DMAX 4X6 RT LRG (Mesh General) ×1 IMPLANT
NDL HYPO 25GX1X1/2 BEV (NEEDLE) ×1 IMPLANT
NDL INSUFFLATION 14GA 120MM (NEEDLE) IMPLANT
NEEDLE HYPO 25GX1X1/2 BEV (NEEDLE) IMPLANT
NEEDLE INSUFFLATION 14GA 120MM (NEEDLE) IMPLANT
NS IRRIG 1000ML POUR BTL (IV SOLUTION) ×2 IMPLANT
PACK GENERAL/GYN (CUSTOM PROCEDURE TRAY) ×1 IMPLANT
PAD ARMBOARD 7.5X6 YLW CONV (MISCELLANEOUS) ×4 IMPLANT
PENCIL SMOKE EVACUATOR (MISCELLANEOUS) ×2 IMPLANT
RELOAD PROXIMATE 75MM BLUE (ENDOMECHANICALS) ×6 IMPLANT
RELOAD STAPLE 4.0 BLU F/HERNIA (INSTRUMENTS) IMPLANT
RELOAD STAPLE 4.8 BLK F/HERNIA (STAPLE) IMPLANT
RELOAD STAPLE 75 3.8 BLU REG (ENDOMECHANICALS) IMPLANT
RELOAD STAPLE HERNIA 4.0 BLUE (INSTRUMENTS) ×2 IMPLANT
RELOAD STAPLE HERNIA 4.8 BLK (STAPLE) IMPLANT
SCISSORS LAP 5X35 DISP (ENDOMECHANICALS) ×2 IMPLANT
SET IRRIG TUBING LAPAROSCOPIC (IRRIGATION / IRRIGATOR) ×1 IMPLANT
SET TUBE SMOKE EVAC HIGH FLOW (TUBING) ×2 IMPLANT
SPONGE INTESTINAL PEANUT (DISPOSABLE) IMPLANT
STAPLER HERNIA 12 8.5 360D (INSTRUMENTS) ×1 IMPLANT
STAPLER PROXIMATE 75MM BLUE (STAPLE) ×1 IMPLANT
STAPLER VISISTAT 35W (STAPLE) ×1 IMPLANT
SUT MNCRL AB 4-0 PS2 18 (SUTURE) ×2 IMPLANT
SUT PDS AB 1 TP1 54 (SUTURE) ×2 IMPLANT
SUT PROLENE 2 0 SH DA (SUTURE) ×1 IMPLANT
SUT SILK 0 TIES 10X30 (SUTURE) ×2 IMPLANT
SUT SILK 3 0 SH CR/8 (SUTURE) ×1 IMPLANT
SUT VIC AB 1 CT1 27 (SUTURE)
SUT VIC AB 1 CT1 27XBRD ANBCTR (SUTURE) IMPLANT
SUT VIC AB 2-0 SH 27 (SUTURE)
SUT VIC AB 2-0 SH 27X BRD (SUTURE) ×1 IMPLANT
SUT VIC AB 3-0 SH 27 (SUTURE)
SUT VIC AB 3-0 SH 27XBRD (SUTURE) ×1 IMPLANT
SUT VICRYL AB 2 0 TIES (SUTURE) ×1 IMPLANT
SYR CONTROL 10ML LL (SYRINGE) ×1 IMPLANT
SYRINGE TOOMEY DISP (SYRINGE) ×2 IMPLANT
TOWEL GREEN STERILE (TOWEL DISPOSABLE) ×2 IMPLANT
TOWEL GREEN STERILE FF (TOWEL DISPOSABLE) ×2 IMPLANT
TRAY FOL W/BAG SLVR 16FR STRL (SET/KITS/TRAYS/PACK) ×1 IMPLANT
TRAY FOLEY W/BAG SLVR 14FR (SET/KITS/TRAYS/PACK) ×1 IMPLANT
TRAY FOLEY W/BAG SLVR 16FR LF (SET/KITS/TRAYS/PACK)
TRAY LAPAROSCOPIC MC (CUSTOM PROCEDURE TRAY) ×2 IMPLANT
TROCAR OPTICAL SHORT 5MM (TROCAR) ×2 IMPLANT
TROCAR OPTICAL SLV SHORT 5MM (TROCAR) ×2 IMPLANT
TROCAR XCEL 12X100 BLDLESS (ENDOMECHANICALS) ×2 IMPLANT
TUBE CONNECTING 12X1/4 (SUCTIONS) ×1 IMPLANT
WARMER LAPAROSCOPE (MISCELLANEOUS) ×2 IMPLANT
WATER STERILE IRR 1000ML POUR (IV SOLUTION) ×2 IMPLANT
YANKAUER SUCT BULB TIP NO VENT (SUCTIONS) ×1 IMPLANT

## 2021-12-17 NOTE — ED Triage Notes (Signed)
Pt here with reports of being unable to keep any PO intake down since Friday. Pt dx with UTI on Friday as well. Pt states since then she has had dizziness and blurry vision. BP 70 systolic in triage.  ?

## 2021-12-17 NOTE — Anesthesia Postprocedure Evaluation (Signed)
Anesthesia Post Note ? ?Patient: Cynthia Morrison ? ?Procedure(s) Performed: LAPAROSCOPIC RIGHT FEMORAL HERNIA REPAIR WITH MESH (Right: Abdomen) ?EXPLORATORY LAPAROTOMY SMALL BOWEL RESECTION WITH ANASTOMOSIS (Right: Abdomen) ? ?  ? ?Patient location during evaluation: PACU ?Anesthesia Type: General ?Level of consciousness: awake ?Pain management: pain level controlled ?Respiratory status: spontaneous breathing ?Cardiovascular status: stable ?Postop Assessment: no apparent nausea or vomiting ?Anesthetic complications: no ? ? ?No notable events documented. ? ?Last Vitals:  ?Vitals:  ? 12/17/21 2205 12/17/21 2220  ?BP: 106/66 96/64  ?Pulse: 97 84  ?Resp: 18 (!) 23  ?Temp:    ?SpO2: 96% 93%  ?  ?Last Pain:  ?Vitals:  ? 12/17/21 2205  ?PainSc: 4   ? ? ?  ?  ?  ?  ?  ?  ? ?Aahna Rossa ? ? ? ? ?

## 2021-12-17 NOTE — Telephone Encounter (Signed)
Spoke with Erie Noe, ok per DPR  ?Patient went to Urgent care several days ago and then PCP at [facility] yesterday ?Patient was diagnosed with kidney stones and UTI  ?She has had N&V for several days and is unable to keep anything down. Now developed cramping ?Advised this was her cardiology office and would need to contact PCP or take patient back to ED ?Daughter verbalized understanding  ?

## 2021-12-17 NOTE — Op Note (Signed)
12/17/2021 ? ?9:11 PM ? ?PATIENT:  Cynthia Morrison  66 y.o. female ? ?PRE-OPERATIVE DIAGNOSIS:  Incarcerated Right inguinal Hernia ? ?POST-OPERATIVE DIAGNOSIS:  incarcerated, strangulated right femoral hernia ? ?PROCEDURE:  Procedure(s): ?LAPAROSCOPIC RIGHT femoral HERNIA REPAIR WITH MESH, TEP (Right) ?Exploratory laparotomy, small bowel resection with anastomosis ? ?SURGEON:  Surgeon(s) and Role: ?   Axel Filler, MD - Primary ?  ? ?ANESTHESIA:   local and general ? ?EBL:  15 mL  ? ?BLOOD ADMINISTERED:none ? ?DRAINS: none  ? ?LOCAL MEDICATIONS USED:  BUPIVICAINE  ? ?SPECIMEN:  Source of Specimen: Strangulated portion of small bowel ? ?DISPOSITION OF SPECIMEN:  PATHOLOGY ? ?COUNTS:  YES ? ?TOURNIQUET:  * No tourniquets in log * ? ?DICTATION: .Dragon Dictation ?Indication for procedure: ?Patient is a 66 year old female with a 4-day history of incarcerated right inguinal hernia.  Patient had associated nausea vomiting.  Patient was found to have a incarcerated femoral hernia.  Patient taken back to the operating room emergently for repair. ? ?Findings: ?Patient had a small right-sided femoral hernia as expected.  This was fixed via a T EP hernia repair with mesh.  There was a strangulated patient portion of small bowel that appeared to be ischemic.  This was resected via a midline incision. ? ?Counts: reported as correct x 2 ? ? ?Details of the procedure: The patient was taken back to the operating room. The patient was placed in supine position with bilateral SCDs in place.  The patient was prepped and draped in the usual sterile fashion.  After appropriate anitbiotics were confirmed, a time-out was confirmed and all facts were verified. ? ?0.25% Marcaine was used to infiltrate the umbilical area. A 11-blade was used to cut down the skin and blunt dissection was used to get the anterior fashion.  The anterior fascia was incised approximately 1 cm and the muscles were retracted laterally. Blunt dissection was then  used to create a space in the preperitoneal area. At this time a 10 mm camera was then introduced into the space and advanced the pubic tubercle and a 12 mm trocar was placed over this and insufflation was started.  At this time it was evident there was a incarcerated portion of the femoral hernia.  This was attempted to be reduced.  However I was unable to complete this.  At this time a hook cautery was used to incise the rectus fascia anteriorly.  This allowed me to reduce the contents of the hernia.  At this time it was evident there was a femoral hernia.  At this time a space was created ? ?For the mesh.  The round ligament was identified and cauterized and transected. ? ?There was a small tear in the hernia sac.  This was closed using a Endoloop x1. ? ?At this time a piece of Bard 3D max size large mesh was placed in the preperitoneal space.  This was tacked to Cooper's ligament using 4.11mm staples from a Coviden hernia stapler. It was anchored to the anterior abdominal wall with 4.8 mm staples. The hernia sac was seen lying posterior to the mesh. There was no staples placed laterally. The insufflation was evacuated and the peritoneum was seen posterior to the mesh.  ? ?At this time using the same umbilical trocar as well as supra pubic trocars these were then placed intra-abdominal.  Insufflation was begun.  I was able to visualize a portion of small bowel that was concerning.  At this time I was concerned about  ischemia.  This time the umbilical trocar site was extended inferiorly.  Cautery was used to maintain hemostasis and dissection was taken down the linea alba.  This was incised.  I was able to obtain intra-abdominal access.  At this time this was extended to the length of the skin incision.  The portion of small bowel was concerned and was brought up through the wound.  This again appeared to be ischemic.  Mesenteric windows were made proximal and distal to the concerning bowel.  75 GIA stapler was  then used to transect each end.  Corner of the staple lines were transected.  Anastomosis created using a another firing of the 75 GIA stapler.  The staple lines were offset and the anastomosis was closed using a 75 GIA stapler.  An apex stitch was placed using a 3-0 silk x1.  The mesentery was then reapproximated using figure-of-eight 3-0 silk x1.  The intestines were placed into the abdominal cavity.  At this time the fascia was then reapproximate using 1 single-stranded PDS x2 in a standard running fashion.  The skin was then irrigated out with sterile saline.  The trocar incisions in the midline incision was reapproximated and skin staples. ? ?Patient tolerated procedure well was taken to the recovery in stable condition. ? ?PLAN OF CARE: Admit for inpatient ? ?PATIENT DISPOSITION:  PACU - hemodynamically stable. ?  ?Delay start of Pharmacological VTE agent (>24hrs) due to surgical blood loss or risk of bleeding: yes ? ?

## 2021-12-17 NOTE — H&P (Signed)
?History and Physical  ? ? Cynthia Morrison PNT:[id number] DOB: 23-Feb-1956 DOA: 12/17/2021 ? ?PCP: Hillery Aldo, PA-C (Confirm with patient/family/NH records and if not entered, this has to be entered at [facility] point of entry) ?Patient coming from: Home ? ?I have personally briefly reviewed patient's old medical records in [facility] ? ?Chief Complaint: Belly hurts, N/V ? ?HPI: Cynthia Morrison is a 66 y.o. female with medical history significant of HTN, anxiety/depression, presented with abdominal pain, frequent nauseous vomiting. ? ?Symptoms started last Friday, patient woke up with "I think there was a hernia popped up in my groin" with cramping like pain.  Patient came to ED 9 morning, work-up showed patient had hernia sac on the right groin, abdominal x-ray showed no signs of bowel obstruction, UA showed signs of UTI, but patient denied any dysuria urinary frequency, no fever or chills.  Patient was sent home with p.o. Keflex.  Patient however developed severe nauseous vomiting that night and persisted until today.  She has not been able to eat or drink much since Friday.  The right groin pain appears to be stable, but she reported the hernia sac has not been reduced by itself.  She denies any back pain, no urinary symptoms, no fever or chills.  She felt severely dehydrated and not able to eat or drink since Friday, and came into the hospital today.  Her urine output has significantly decreased and urine color were dark. ? ?ED Course: Patient is tachycardia, and hypotensive. ? ?Blood pressure improved after total of 1.7 L of IV bolus. ? ?Review of Systems: As per HPI otherwise 14 point review of systems negative.  ? ? ?Past Medical History:  ?Diagnosis Date  ? Hypertension   ? ? ?Past Surgical History:  ?Procedure Laterality Date  ? BACK SURGERY    ? GANGLION CYST EXCISION Right   ? WISDOM TOOTH EXTRACTION    ? ? ? reports that she has quit smoking. She has never used smokeless tobacco. She reports current  alcohol use. She reports that she does not use drugs. ? ?Allergies  ?Allergen Reactions  ? Codeine Other (See Comments)  ?  Nightmares   ? Aleve [Naproxen Sodium] Rash  ? ? ?Family History  ?Problem Relation Age of Onset  ? Hypertension Mother   ? Heart failure Father   ? Pancreatic cancer Father   ? ? ? ?Prior to Admission medications   ?Medication Sig Start Date End Date Taking? Authorizing Provider  ?amLODipine (NORVASC) 5 MG tablet Take 1 tablet (5 mg total) by mouth daily. 06/30/19   Jodelle Red, MD  ?cephALEXin (KEFLEX) 500 MG capsule Take 1 capsule (500 mg total) by mouth 4 (four) times daily. 12/13/21   Valentino Nose, NP  ?clonazePAM (KLONOPIN) 1 MG tablet Take 1 mg by mouth at bedtime as needed. 06/23/19   [provider]  ?docusate sodium (COLACE) 100 MG capsule Take 1 capsule (100 mg total) by mouth every 12 (twelve) hours. 12/13/21   Valentino Nose, NP  ?losartan (COZAAR) 50 MG tablet Take 50 mg by mouth 2 (two) times daily. 06/23/19   [provider]  ?ondansetron (ZOFRAN-ODT) 4 MG disintegrating tablet Take 1 tablet (4 mg total) by mouth every 8 (eight) hours as needed for nausea or vomiting. 12/13/21   Valentino Nose, NP  ?polyethylene glycol (MIRALAX) 17 g packet Take 17 g by mouth daily. 12/13/21   Valentino Nose, NP  ? ? ?Physical Exam: ?Vitals:  ?  12/17/21 1745 12/17/21 1800 12/17/21 1806 12/17/21 1808  ?BP: 95/72 100/75    ?Pulse: (!) 113 (!) 115    ?Resp:  (!) 31    ?Temp:      ?SpO2: (!) 89% (!) 86% 92% 91%  ? ? ?Constitutional: NAD, calm, comfortable ?Vitals:  ? 12/17/21 1745 12/17/21 1800 12/17/21 1806 12/17/21 1808  ?BP: 95/72 100/75    ?Pulse: (!) 113 (!) 115    ?Resp:  (!) 31    ?Temp:      ?SpO2: (!) 89% (!) 86% 92% 91%  ? ?Eyes: PERRL, lids and conjunctivae normal ?ENMT: Mucous membranes are dry. Posterior pharynx clear of any exudate or lesions.Normal dentition.  ?Neck: normal, supple, no masses, no thyromegaly ?Respiratory: clear to  auscultation bilaterally, no wheezing, no crackles. Normal respiratory effort. No accessory muscle use.  ?Cardiovascular: Regular rate and rhythm, no murmurs / rubs / gallops. No extremity edema. 2+ pedal pulses. No carotid bruits.  ?Abdomen: a golf ball sized hernia on the right groin, hard and tender to touch, when trying to reduce, patient developed abrupt pain and had to stop, no masses palpated. No hepatosplenomegaly. Bowel sounds positive.  ?Musculoskeletal: no clubbing / cyanosis. No joint deformity upper and lower extremities. Good ROM, no contractures. Normal muscle tone.  ?Skin: no rashes, lesions, ulcers. No induration ?Neurologic: CN 2-12 grossly intact. Sensation intact, DTR normal. Strength 5/5 in all 4.  ?Psychiatric: Normal judgment and insight. Alert and oriented x 3. Normal mood.  ? ? ? ?Labs on Admission: I have personally reviewed following labs and imaging studies ? ?CBC: ?Recent Labs  ?Lab 12/17/21 ?1348  ?WBC 10.5  ?NEUTROABS 7.8*  ?HGB 18.4*  ?HCT 53.5*  ?MCV 99.3  ?PLT 279  ? ?Basic Metabolic Panel: ?Recent Labs  ?Lab 12/17/21 ?1540  ?NA 132*  ?K 2.9*  ?CL 78*  ?CO2 32  ?GLUCOSE 107*  ?BUN 64*  ?CREATININE 5.36*  ?CALCIUM 7.9*  ? ?GFR: ?CrCl cannot be calculated (Unknown ideal weight.). ?Liver Function Tests: ?Recent Labs  ?Lab 12/17/21 ?1540  ?AST 38  ?ALT 39  ?ALKPHOS 60  ?BILITOT 0.4  ?PROT 5.9*  ?ALBUMIN 2.8*  ? ?No results for input(s): LIPASE, AMYLASE in the last 168 hours. ?No results for input(s): AMMONIA in the last 168 hours. ?Coagulation Profile: ?Recent Labs  ?Lab 12/17/21 ?1348  ?INR 1.1  ? ?Cardiac Enzymes: ?No results for input(s): CKTOTAL, CKMB, CKMBINDEX, TROPONINI in the last 168 hours. ?BNP (last 3 results) ?No results for input(s): PROBNP in the last 8760 hours. ?HbA1C: ?No results for input(s): HGBA1C in the last 72 hours. ?CBG: ?No results for input(s): GLUCAP in the last 168 hours. ?Lipid Profile: ?No results for input(s): CHOL, HDL, LDLCALC, TRIG, CHOLHDL, LDLDIRECT  in the last 72 hours. ?Thyroid Function Tests: ?No results for input(s): TSH, T4TOTAL, FREET4, T3FREE, THYROIDAB in the last 72 hours. ?Anemia Panel: ?No results for input(s): VITAMINB12, FOLATE, FERRITIN, TIBC, IRON, RETICCTPCT in the last 72 hours. ?Urine analysis: ?   ?Component Value Date/Time  ? LABSPEC >=1.030 12/13/2021 1112  ? PHURINE 5.0 12/13/2021 1112  ? GLUCOSEU NEGATIVE 12/13/2021 1112  ? HGBUR TRACE (A) 12/13/2021 1112  ? BILIRUBINUR NEGATIVE 12/13/2021 1112  ? KETONESUR TRACE (A) 12/13/2021 1112  ? PROTEINUR 100 (A) 12/13/2021 1112  ? UROBILINOGEN 0.2 12/13/2021 1112  ? NITRITE POSITIVE (A) 12/13/2021 1112  ? LEUKOCYTESUR NEGATIVE 12/13/2021 1112  ? ? ?Radiological Exams on Admission: ?US RENAL ? ?Result Date: 12/17/2021 ?CLINICAL DATA:  Acute kidney injury EXAM:  RENAL / URINARY TRACT ULTRASOUND COMPLETE COMPARISON:  None. FINDINGS: Right Kidney: Renal measurements: 9.3 by 5.4 by 4.9 cm = volume: 128 mL. Echogenicity within normal limits. No mass or hydronephrosis visualized. Left Kidney: Renal measurements: 9.2 by 4.7 by 5.1 cm= volume: 120 mL. Echogenicity within normal limits. No mass or hydronephrosis visualized. Bladder: Appears normal for degree of bladder distention. Other: None. IMPRESSION: 1. No specific sonographic abnormality to correlate with the patient's acute kidney injury. Electronically Signed   By: Gaylyn RongWalter  Liebkemann M.D.   On: 12/17/2021 18:32  ? ?DG Chest Port 1 View ? ?Result Date: 12/17/2021 ?CLINICAL DATA:  Sepsis. EXAM: PORTABLE CHEST 1 VIEW COMPARISON:  10/24/2015 FINDINGS: The cardiac silhouette, mediastinal and hilar contours are normal. The lungs are clear. No pleural effusions. No pulmonary lesions. Air-filled loops of small bowel are noted in the upper abdomen. The bony thorax is intact. IMPRESSION: No acute cardiopulmonary findings. Electronically Signed   By: Rudie MeyerP.  Gallerani M.D.   On: 12/17/2021 14:25   ? ?EKG: Independently reviewed.  Sinus tachycardia, no acute ST-T  changes. ? ?Assessment/Plan ?Principal Problem: ?  AKI (acute kidney injury) (HCC) ?Active Problems: ?  Incarcerated femoral hernia ? (please populate well all problems here in Problem List. (For example, if patien

## 2021-12-17 NOTE — Telephone Encounter (Signed)
Patients daughter called and said that her mom cannot keep anything down even taking medicaine and she is becoming dizzy and lightheaded. Mom has been to ER and they said if it gets worse to come back to ER and mom is wanting to admit herself. Would like to talk with a nurse about symptoms. Patient has not been seen since 2020 with Dr. Harrell Gave. Please call daughter Lorriane Shire at [phone] ?

## 2021-12-17 NOTE — ED Provider Notes (Signed)
?[facility] ?Provider Note ? ? ?CSN: [id number] ?Arrival date & time: 12/17/21  1233 ? ?  ? ?History ? ?Chief Complaint  ?Patient presents with  ? Emesis  ? ? ?Cynthia Morrison is a 66 y.o. female with past medical history significant for hypertension who presents with concern for ongoing nausea, vomiting, dysuria with known UTI diagnosed on Friday.  Patient reports that she has been unable to keep anything down despite nausea medication, and so she has not been able to complete her antibiotic regimen.  She returned to urgent care, received IM antibiotics, and was told to return if symptoms worsen despite treatment.  Patient with no history of hypotension, she arrives with systolic in the low to mid 60s.  She endorses that she felt dizzy, lightheaded yesterday and stumbled while in the shower.  She denies loss of consciousness, heart palpitations, chest pain, shortness of breath, head injury.  She does not take any blood thinners.  At this time she denies any significant abdominal pain.  She was noted to have an inguinal hernia on evaluation in urgent care.  She denies diarrhea, constipation, fever, chills.  She reports last time she urinated was last night. ? ? ?Emesis ? ?  ? ?Home Medications ?Prior to Admission medications   ?Medication Sig Start Date End Date Taking? Authorizing Provider  ?amLODipine (NORVASC) 5 MG tablet Take 1 tablet (5 mg total) by mouth daily. 06/30/19   Jodelle Redhristopher, Bridgette, MD  ?cephALEXin (KEFLEX) 500 MG capsule Take 1 capsule (500 mg total) by mouth 4 (four) times daily. 12/13/21   Valentino NoseMartinez, Jessica A, NP  ?clonazePAM (KLONOPIN) 1 MG tablet Take 1 mg by mouth at bedtime as needed. 06/23/19   [provider]  ?docusate sodium (COLACE) 100 MG capsule Take 1 capsule (100 mg total) by mouth every 12 (twelve) hours. 12/13/21   Valentino NoseMartinez, Jessica A, NP  ?losartan (COZAAR) 50 MG tablet Take 50 mg by mouth 2 (two) times daily. 06/23/19   [provider]  ?ondansetron (ZOFRAN-ODT) 4 MG disintegrating tablet Take 1 tablet (4 mg total) by mouth every 8 (eight) hours as needed for nausea or vomiting. 12/13/21   Valentino NoseMartinez, Jessica A, NP  ?polyethylene glycol (MIRALAX) 17 g packet Take 17 g by mouth daily. 12/13/21   Valentino NoseMartinez, Jessica A, NP  ?   ? ?Allergies    ?Codeine and Aleve [naproxen sodium]   ? ?Review of Systems   ?Review of Systems  ?Constitutional:  Positive for fatigue.  ?Gastrointestinal:  Positive for vomiting.  ?Neurological:  Positive for weakness.  ?All other systems reviewed and are negative. ? ?Physical Exam ?Updated Vital Signs ?BP 96/75   Pulse (!) 103   Temp 98.4 ?F (36.9 ?C)   Resp (!) 27   SpO2 93%  ?Physical Exam ?Vitals and nursing note reviewed.  ?Constitutional:   ?   General: She is not in acute distress. ?   Appearance: Normal appearance. She is ill-appearing.  ?HENT:  ?   Head: Normocephalic and atraumatic.  ?   Mouth/Throat:  ?   Mouth: Mucous membranes are dry.  ?Eyes:  ?   General:     ?   Right eye: No discharge.     ?   Left eye: No discharge.  ?Cardiovascular:  ?   Rate and Rhythm: Regular rhythm. Tachycardia present.  ?   Heart sounds: No murmur heard. ?  No friction rub. No gallop.  ?   Comments: Thready radial,  ulnar pulses on arrival ?Pulmonary:  ?   Effort: Pulmonary effort is normal.  ?   Breath sounds: Normal breath sounds.  ?Abdominal:  ?   General: Bowel sounds are normal.  ?   Palpations: Abdomen is soft.  ?   Comments: Minimal tenderness to palpation suprapubic region, no rebound, rigidity, guarding throughout abdomen.  No palpable mass.  No ecchymosis.  ?Genitourinary: ?   Comments: Patient with hernia in right inguinal region, which is tender to the touch.  I did not attempt reduction as patient was being evaluated and aggressively rehydrated for possible sepsis ?Skin: ?   General: Skin is warm and dry.  ?   Capillary Refill: Capillary refill takes less than 2 seconds.  ?Neurological:  ?   Mental Status:  She is alert and oriented to person, place, and time.  ?Psychiatric:     ?   Mood and Affect: Mood normal.     ?   Behavior: Behavior normal.  ? ? ?ED Results / Procedures / Treatments   ?Labs ?(all labs ordered are listed, but only abnormal results are displayed) ?Labs Reviewed  ?LACTIC ACID, PLASMA - Abnormal; Notable for the following components:  ?    Result Value  ? Lactic Acid, Venous 6.1 (*)   ? All other components within normal limits  ?CBC WITH DIFFERENTIAL/PLATELET - Abnormal; Notable for the following components:  ? RBC 5.39 (*)   ? Hemoglobin 18.4 (*)   ? HCT 53.5 (*)   ? MCH 34.1 (*)   ? Neutro Abs 7.8 (*)   ? Monocytes Absolute 1.4 (*)   ? All other components within normal limits  ?APTT - Abnormal; Notable for the following components:  ? aPTT 23 (*)   ? All other components within normal limits  ?COMPREHENSIVE METABOLIC PANEL - Abnormal; Notable for the following components:  ? Sodium 132 (*)   ? Potassium 2.9 (*)   ? Chloride 78 (*)   ? Glucose, Bld 107 (*)   ? BUN 64 (*)   ? Creatinine, Ser 5.36 (*)   ? Calcium 7.9 (*)   ? Total Protein 5.9 (*)   ? Albumin 2.8 (*)   ? GFR, Estimated 8 (*)   ? Anion gap 22 (*)   ? All other components within normal limits  ?RESP PANEL BY RT-PCR (FLU A&B, COVID) [id number]  ?URINE CULTURE  ?CULTURE, BLOOD (ROUTINE X 2)  ?CULTURE, BLOOD (ROUTINE X 2)  ?LACTIC ACID, PLASMA  ?PROTIME-INR  ?URINALYSIS, ROUTINE W REFLEX MICROSCOPIC  ?BASIC METABOLIC PANEL  ?HIV ANTIBODY (ROUTINE TESTING W REFLEX)  ?CBC  ?MAGNESIUM  ? ? ?EKG ?EKG Interpretation ? ?Date/Time:  Tuesday December 17 2021 13:21:05 EDT ?Ventricular Rate:  121 ?PR Interval:  142 ?QRS Duration: 93 ?QT Interval:  340 ?QTC Calculation: 483 ?R Axis:   50 ?Text Interpretation: Sinus tachycardia LAE, consider biatrial enlargement Nonspecific repol abnormality, diffuse leads Abnormal ECG Confirmed by Gerhard Munch [phone]) on 12/17/2021 1:24:01 PM ? ?Radiology ?US RENAL ? ?Result Date: 12/17/2021 ?CLINICAL DATA:  Acute kidney  injury EXAM: RENAL / URINARY TRACT ULTRASOUND COMPLETE COMPARISON:  None. FINDINGS: Right Kidney: Renal measurements: 9.3 by 5.4 by 4.9 cm = volume: 128 mL. Echogenicity within normal limits. No mass or hydronephrosis visualized. Left Kidney: Renal measurements: 9.2 by 4.7 by 5.1 cm= volume: 120 mL. Echogenicity within normal limits. No mass or hydronephrosis visualized. Bladder: Appears normal for degree of bladder distention. Other: None. IMPRESSION: 1. No specific sonographic abnormality to correlate with  the patient's acute kidney injury. Electronically Signed   By: Gaylyn Rong M.D.   On: 12/17/2021 18:32  ? ?DG Chest Port 1 View ? ?Result Date: 12/17/2021 ?CLINICAL DATA:  Sepsis. EXAM: PORTABLE CHEST 1 VIEW COMPARISON:  10/24/2015 FINDINGS: The cardiac silhouette, mediastinal and hilar contours are normal. The lungs are clear. No pleural effusions. No pulmonary lesions. Air-filled loops of small bowel are noted in the upper abdomen. The bony thorax is intact. IMPRESSION: No acute cardiopulmonary findings. Electronically Signed   By: Rudie Meyer M.D.   On: 12/17/2021 14:25   ? ?Procedures ?Marland KitchenCritical Care ?Performed by: Olene Floss, PA-C ?Authorized by: Olene Floss, PA-C  ? ?Critical care provider statement:  ?  Critical care time (minutes):  36 ?  Critical care was necessary to treat or prevent imminent or life-threatening deterioration of the following conditions:  Sepsis ?  Critical care was time spent personally by me on the following activities:  Development of treatment plan with patient or surrogate, discussions with consultants, evaluation of patient's response to treatment, examination of patient, ordering and review of laboratory studies, ordering and review of radiographic studies, ordering and performing treatments and interventions, pulse oximetry, re-evaluation of patient's condition and review of old charts ?  Care discussed with: admitting provider    ? ? ?Medications  Ordered in ED ?Medications  ?lactated ringers infusion ( Intravenous New Bag/Given 12/17/21 1504)  ?cefTRIAXone (ROCEPHIN) 2 g in sodium chloride 0.9 % 100 mL IVPB ( Intravenous Automatically Held 12/23/21 1330)  ?ond

## 2021-12-17 NOTE — Transfer of Care (Signed)
Immediate Anesthesia Transfer of Care Note ? ?Patient: Cynthia Morrison ? ?Procedure(s) Performed: LAPAROSCOPIC RIGHT FEMORAL HERNIA REPAIR WITH MESH (Right: Abdomen) ?EXPLORATORY LAPAROTOMY SMALL BOWEL RESECTION WITH ANASTOMOSIS (Right: Abdomen) ? ?Patient Location: PACU ? ?Anesthesia Type:General ? ?Level of Consciousness: awake and responds to stimulation ? ?Airway & Oxygen Therapy: Patient Spontanous Breathing and Patient connected to face mask oxygen ? ?Post-op Assessment: Report given to RN, Post -op Vital signs reviewed and stable and Patient moving all extremities X 4 ? ?Post vital signs: Reviewed and stable ? ?Last Vitals:  ?Vitals Value Taken Time  ?BP 100/61 12/17/21 2132  ?Temp    ?Pulse 98 12/17/21 2137  ?Resp 23 12/17/21 2137  ?SpO2 98 % 12/17/21 2137  ?Vitals shown include unvalidated device data. ? ?Last Pain:  ?Vitals:  ? 12/17/21 1928  ?PainSc: 0-No pain  ?   ? ?  ? ?Complications: No notable events documented. ?

## 2021-12-17 NOTE — Consult Note (Addendum)
Reason for Consult: Abdominal pain ?Referring Physician: Dr. Chipper Herb ? ?Cynthia Morrison is an 66 y.o. female.  ?HPI: Patient is a 66 year old female, who comes in secondary to abdominal pain, nausea vomiting.  She states this began on Friday.  She states that at the same time she did notice there was a lump to her right inguinal area.  She states that since that time she has not been able to take any p.o. has had continued nausea vomiting.  She states that she has not had a previous hernia in the past.  She cannot push the bulge back in. ? ?Patient was seen by PCP and was thought to have possible UTI infection. ? ?Patient had no previous operations. ? ? ? ?Past Medical History:  ?Diagnosis Date  ? Hypertension   ? ? ?Past Surgical History:  ?Procedure Laterality Date  ? BACK SURGERY    ? GANGLION CYST EXCISION Right   ? WISDOM TOOTH EXTRACTION    ? ? ?Family History  ?Problem Relation Age of Onset  ? Hypertension Mother   ? Heart failure Father   ? Pancreatic cancer Father   ? ? ?Social History:  reports that she has quit smoking. She has never used smokeless tobacco. She reports current alcohol use. She reports that she does not use drugs. ? ?Allergies:  ?Allergies  ?Allergen Reactions  ? Codeine Other (See Comments)  ?  Nightmares   ? Aleve [Naproxen Sodium] Rash  ? ? ?Medications: I have reviewed the patient's current medications. ? ?Results for orders placed or performed during the hospital encounter of 12/17/21 (from the past 48 hour(s))  ?Resp Panel by RT-PCR (Flu A&B, Covid) Nasopharyngeal Swab     Status: None  ? Collection Time: 12/17/21  1:25 PM  ? Specimen: Nasopharyngeal Swab; Nasopharyngeal(NP) swabs in vial transport medium  ?Result Value Ref Range  ? SARS Coronavirus 2 by RT PCR NEGATIVE NEGATIVE  ?  Comment: (NOTE) ?SARS-CoV-2 target nucleic acids are NOT DETECTED. ? ?The SARS-CoV-2 RNA is generally detectable in upper respiratory ?specimens during the acute phase of infection. The lowest ?concentration  of SARS-CoV-2 viral copies this assay can detect is ?138 copies/mL. A negative result does not preclude SARS-Cov-2 ?infection and should not be used as the sole basis for treatment or ?other patient management decisions. A negative result may occur with  ?improper specimen collection/handling, submission of specimen other ?than nasopharyngeal swab, presence of viral mutation(s) within the ?areas targeted by this assay, and inadequate number of viral ?copies(<138 copies/mL). A negative result must be combined with ?clinical observations, patient history, and epidemiological ?information. The expected result is Negative. ? ?Fact Sheet for Patients:  ?BloggerCourse.com ? ?Fact Sheet for Healthcare Providers:  ?SeriousBroker.it ? ?This test is no t yet approved or cleared by the [place] FDA and  ?has been authorized for detection and/or diagnosis of SARS-CoV-2 by ?FDA under an Emergency Use Authorization (EUA). This EUA will remain  ?in effect (meaning this test can be used) for the duration of the ?COVID-19 declaration under Section 564(b)(1) of the Act, 21 ?U.S.C.section 360bbb-3(b)(1), unless the authorization is terminated  ?or revoked sooner.  ? ? ?  ? Influenza A by PCR NEGATIVE NEGATIVE  ? Influenza B by PCR NEGATIVE NEGATIVE  ?  Comment: (NOTE) ?The Xpert Xpress SARS-CoV-2/FLU/RSV plus assay is intended as an aid ?in the diagnosis of influenza from Nasopharyngeal swab specimens and ?should not be used as a sole basis for treatment. Nasal washings and ?aspirates are unacceptable  for Xpert Xpress SARS-CoV-2/FLU/RSV ?testing. ? ?Fact Sheet for Patients: ?BloggerCourse.com ? ?Fact Sheet for Healthcare Providers: ?SeriousBroker.it ? ?This test is not yet approved or cleared by the [place] FDA and ?has been authorized for detection and/or diagnosis of SARS-CoV-2 by ?FDA under an Emergency Use Authorization  (EUA). This EUA will remain ?in effect (meaning this test can be used) for the duration of the ?COVID-19 declaration under Section 564(b)(1) of the Act, 21 U.S.C. ?section 360bbb-3(b)(1), unless the authorization is terminated or ?revoked. ? ?Performed at [facility], [address] [place], [place] ?[address] ?  ?Lactic acid, plasma     Status: Abnormal  ? Collection Time: 12/17/21  1:25 PM  ?Result Value Ref Range  ? Lactic Acid, Venous 6.1 (HH) 0.5 - 1.9 mmol/L  ?  Comment: CRITICAL RESULT CALLED TO, READ BACK BY AND VERIFIED WITH: ?[facility] RN @ 1458 12/17/21 LEONARD,A ?Performed at [facility], [address] [place], [place] [address] ?  ?CBC WITH DIFFERENTIAL     Status: Abnormal  ? Collection Time: 12/17/21  1:48 PM  ?Result Value Ref Range  ? WBC 10.5 4.0 - 10.5 K/uL  ? RBC 5.39 (H) 3.87 - 5.11 MIL/uL  ? Hemoglobin 18.4 (H) 12.0 - 15.0 g/dL  ? HCT 53.5 (H) 36.0 - 46.0 %  ? MCV 99.3 80.0 - 100.0 fL  ? MCH 34.1 (H) 26.0 - 34.0 pg  ? MCHC 34.4 30.0 - 36.0 g/dL  ? RDW 13.2 11.5 - 15.5 %  ? Platelets 279 150 - 400 K/uL  ? nRBC 0.0 0.0 - 0.2 %  ? Neutrophils Relative % 74 %  ? Neutro Abs 7.8 (H) 1.7 - 7.7 K/uL  ? Lymphocytes Relative 12 %  ? Lymphs Abs 1.2 0.7 - 4.0 K/uL  ? Monocytes Relative 13 %  ? Monocytes Absolute 1.4 (H) 0.1 - 1.0 K/uL  ? Eosinophils Relative 0 %  ? Eosinophils Absolute 0.0 0.0 - 0.5 K/uL  ? Basophils Relative 0 %  ? Basophils Absolute 0.0 0.0 - 0.1 K/uL  ? WBC Morphology MILD LEFT SHIFT (1-5% METAS, OCC MYELO, OCC BANDS)   ? Immature Granulocytes 1 %  ? Abs Immature Granulocytes 0.06 0.00 - 0.07 K/uL  ?  Comment: Performed at [facility], [address], [place], [place] [address]  ?Protime-INR     Status: None  ? Collection Time: 12/17/21  1:48 PM  ?Result Value Ref Range  ? Prothrombin Time 13.7 11.4 - 15.2 seconds  ? INR 1.1 0.8 - 1.2  ?  Comment: (NOTE) ?INR goal varies based on device and disease states. ?Performed at [facility], [address],  [place], [place] ?[address] ?  ?APTT     Status: Abnormal  ? Collection Time: 12/17/21  1:48 PM  ?Result Value Ref Range  ? aPTT 23 (L) 24 - 36 seconds  ?  Comment: Performed at [facility], [address], [place], [place] [address]  ?Lactic acid, plasma     Status: None  ? Collection Time: 12/17/21  3:40 PM  ?Result Value Ref Range  ? Lactic Acid, Venous 1.9 0.5 - 1.9 mmol/L  ?  Comment: Performed at [facility], [address], [place], [place] [address]  ?Comprehensive metabolic panel     Status: Abnormal  ? Collection Time: 12/17/21  3:40 PM  ?Result Value Ref Range  ? Sodium 132 (L) 135 - 145 mmol/L  ?  Comment: CORRECTED ON 03/28 AT 1647: PREVIOUSLY REPORTED AS 134  ? Potassium 2.9 (L) 3.5 - 5.1 mmol/L  ? Chloride 78 (L) 98 - 111 mmol/L  ?  Comment: CORRECTED ON 03/28 AT 1647: PREVIOUSLY REPORTED AS 80  ? CO2 32 22 - 32 mmol/L  ? Glucose, Bld 107 (H) 70 - 99 mg/dL  ?  Comment: Glucose reference range applies only to samples taken after fasting for at least 8 hours.  ? BUN 64 (H) 8 - 23 mg/dL  ? Creatinine, Ser 5.36 (H) 0.44 - 1.00 mg/dL  ? Calcium 7.9 (L) 8.9 - 10.3 mg/dL  ?  Comment: CORRECTED ON 03/28 AT 1647: PREVIOUSLY REPORTED AS 7.8  ? Total Protein 5.9 (L) 6.5 - 8.1 g/dL  ? Albumin 2.8 (L) 3.5 - 5.0 g/dL  ? AST 38 15 - 41 U/L  ? ALT 39 0 - 44 U/L  ? Alkaline Phosphatase 60 38 - 126 U/L  ? Total Bilirubin 0.4 0.3 - 1.2 mg/dL  ? GFR, Estimated 8 (L) >60 mL/min  ?  Comment: (NOTE) ?Calculated using the CKD-EPI Creatinine Equation (2021) ?  ? Anion gap 22 (H) 5 - 15  ?  Comment: Performed at [facility], [address] [place], [place] [address] ?CORRECTED ON 03/28 AT 1647: PREVIOUSLY REPORTED AS 22 Electrolytes repeated to confirm. ?  ? ? ?US RENAL ? ?Result Date: 12/17/2021 ?CLINICAL DATA:  Acute kidney injury EXAM: RENAL / URINARY TRACT ULTRASOUND COMPLETE COMPARISON:  None. FINDINGS: Right Kidney: Renal measurements: 9.3 by 5.4 by 4.9 cm = volume: 128 mL. Echogenicity within normal  limits. No mass or hydronephrosis visualized. Left Kidney: Renal measurements: 9.2 by 4.7 by 5.1 cm= volume: 120 mL. Echogenicity within normal limits. No mass or hydronephrosis visualized. Bladder: Appears no

## 2021-12-17 NOTE — Sepsis Progress Note (Signed)
Notified bedside nurse of need to administer antibiotics.  

## 2021-12-17 NOTE — Telephone Encounter (Signed)
Patients daughter called and said that her mom cannot keep anything down even taking medicaine and she is becoming dizzy and lightheaded. Mom has been to ER and they said if it gets worse to come back to ER and mom is wanting to admit herself. Would like to talk with a nurse about symptoms. Patient has not been seen since 2020 with Dr. Cristal Deer ?

## 2021-12-17 NOTE — ED Notes (Signed)
Pt tried to drink some ginger ale and vomited moderate amount of dark green emesis. O2 dropped 70's-80's. Oxygen increased and pt able to maintain O2 saturation in the upper 80's. Provider notified    ?

## 2021-12-17 NOTE — Anesthesia Preprocedure Evaluation (Addendum)
Anesthesia Evaluation  ?Patient identified by MRN, date of birth, ID band ?Patient awake ? ? ? ?Reviewed: ?Allergy & Precautions, NPO status , Patient's Chart, lab work & pertinent test results ? ?Airway ?Mallampati: II ? ?TM Distance: >3 FB ? ? ? ? Dental ? ?(+) Edentulous Upper, Edentulous Lower ?  ?Pulmonary ?neg pulmonary ROS, former smoker,  ?  ?breath sounds clear to auscultation ? ? ? ? ? ? Cardiovascular ?hypertension,  ?Rhythm:Regular Rate:Normal ? ? ?  ?Neuro/Psych ?PSYCHIATRIC DISORDERS   ? GI/Hepatic ?negative GI ROS, Neg liver ROS,   ?Endo/Other  ? ? Renal/GU ?Renal disease  ? ?  ?Musculoskeletal ? ? Abdominal ?  ?Peds ? Hematology ?negative hematology ROS ?(+)   ?Anesthesia Other Findings ? ? Reproductive/Obstetrics ? ?  ? ? ? ? ? ? ? ? ? ? ? ? ? ?  ?  ? ? ? ? ? ? ? ?Anesthesia Physical ?Anesthesia Plan ? ?ASA: 3 ? ?Anesthesia Plan: General  ? ?Post-op Pain Management:   ? ?Induction: Intravenous ? ?PONV Risk Score and Plan: 3 ? ?Airway Management Planned: Oral ETT ? ?Additional Equipment:  ? ?Intra-op Plan:  ? ?Post-operative Plan: Extubation in OR ? ?Informed Consent: I have reviewed the patients History and Physical, chart, labs and discussed the procedure including the risks, benefits and alternatives for the proposed anesthesia with the patient or authorized representative who has indicated his/her understanding and acceptance.  ? ? ? ?Dental advisory given ? ?Plan Discussed with: CRNA and Anesthesiologist ? ?Anesthesia Plan Comments:   ? ? ? ? ? ? ?Anesthesia Quick Evaluation ? ?

## 2021-12-17 NOTE — Sepsis Progress Note (Signed)
Code sepsis protocol being monitored by eLink. 

## 2021-12-17 NOTE — Anesthesia Procedure Notes (Signed)
Procedure Name: Intubation ?Date/Time: 12/17/2021 8:17 PM ?Performed by: Suzy Bouchard, CRNA ?Pre-anesthesia Checklist: Patient identified, Suction available, Patient being monitored, Timeout performed and Emergency Drugs available ?Patient Re-evaluated:Patient Re-evaluated prior to induction ?Oxygen Delivery Method: Circle system utilized ?Preoxygenation: Pre-oxygenation with 100% oxygen ?Induction Type: IV induction, Rapid sequence and Cricoid Pressure applied ?Laryngoscope Size: Sabra Heck and 2 ?Grade View: Grade I ?Tube type: Oral ?Tube size: 7.0 mm ?Number of attempts: 1 ?Airway Equipment and Method: Stylet ?Placement Confirmation: positive ETCO2, ETT inserted through vocal cords under direct vision and breath sounds checked- equal and bilateral ?Secured at: 23 cm ?Tube secured with: Tape ?Dental Injury: Teeth and Oropharynx as per pre-operative assessment  ?Comments: Cords clear on DL ? ? ? ? ?

## 2021-12-17 NOTE — ED Notes (Signed)
Help get patient on the monitor did ekg shown to Dr Vanita Panda patient is resting with family at bedside  ?

## 2021-12-18 ENCOUNTER — Encounter (HOSPITAL_COMMUNITY): Payer: Self-pay | Admitting: General Surgery

## 2021-12-18 DIAGNOSIS — K4130 Unilateral femoral hernia, with obstruction, without gangrene, not specified as recurrent: Secondary | ICD-10-CM | POA: Diagnosis not present

## 2021-12-18 DIAGNOSIS — N179 Acute kidney failure, unspecified: Secondary | ICD-10-CM | POA: Diagnosis not present

## 2021-12-18 LAB — BASIC METABOLIC PANEL
Anion gap: 20 — ABNORMAL HIGH (ref 5–15)
BUN: 79 mg/dL — ABNORMAL HIGH (ref 8–23)
CO2: 26 mmol/L (ref 22–32)
Calcium: 7 mg/dL — ABNORMAL LOW (ref 8.9–10.3)
Chloride: 89 mmol/L — ABNORMAL LOW (ref 98–111)
Creatinine, Ser: 5.86 mg/dL — ABNORMAL HIGH (ref 0.44–1.00)
GFR, Estimated: 8 mL/min — ABNORMAL LOW (ref >60)
Glucose, Bld: 85 mg/dL (ref 70–99)
Potassium: 3.6 mmol/L (ref 3.5–5.1)
Sodium: 135 mmol/L (ref 135–145)

## 2021-12-18 LAB — URINALYSIS, ROUTINE W REFLEX MICROSCOPIC
Bilirubin Urine: NEGATIVE
Glucose, UA: 150 mg/dL — AB
Ketones, ur: 5 mg/dL — AB
Nitrite: NEGATIVE
Protein, ur: 100 mg/dL — AB
Specific Gravity, Urine: 1.015 (ref 1.005–1.030)
pH: 5 (ref 5.0–8.0)

## 2021-12-18 LAB — CBC
HCT: 39.1 % (ref 36.0–46.0)
Hemoglobin: 13.3 g/dL (ref 12.0–15.0)
MCH: 33.7 pg (ref 26.0–34.0)
MCHC: 34 g/dL (ref 30.0–36.0)
MCV: 99 fL (ref 80.0–100.0)
Platelets: 193 10*3/uL (ref 150–400)
RBC: 3.95 MIL/uL (ref 3.87–5.11)
RDW: 13.1 % (ref 11.5–15.5)
WBC: 6.7 10*3/uL (ref 4.0–10.5)
nRBC: 0 % (ref 0.0–0.2)

## 2021-12-18 LAB — COMPREHENSIVE METABOLIC PANEL
ALT: 31 U/L (ref 0–44)
AST: 29 U/L (ref 15–41)
Albumin: 2.8 g/dL — ABNORMAL LOW (ref 3.5–5.0)
Alkaline Phosphatase: 54 U/L (ref 38–126)
Anion gap: 18 — ABNORMAL HIGH (ref 5–15)
BUN: 71 mg/dL — ABNORMAL HIGH (ref 8–23)
CO2: 31 mmol/L (ref 22–32)
Calcium: 6.9 mg/dL — ABNORMAL LOW (ref 8.9–10.3)
Chloride: 85 mmol/L — ABNORMAL LOW (ref 98–111)
Creatinine, Ser: 5.96 mg/dL — ABNORMAL HIGH (ref 0.44–1.00)
GFR, Estimated: 7 mL/min — ABNORMAL LOW (ref >60)
Glucose, Bld: 127 mg/dL — ABNORMAL HIGH (ref 70–99)
Potassium: 2.8 mmol/L — ABNORMAL LOW (ref 3.5–5.1)
Sodium: 134 mmol/L — ABNORMAL LOW (ref 135–145)
Total Bilirubin: 0.5 mg/dL (ref 0.3–1.2)
Total Protein: 5.4 g/dL — ABNORMAL LOW (ref 6.5–8.1)

## 2021-12-18 LAB — HIV ANTIBODY (ROUTINE TESTING W REFLEX): HIV Screen 4th Generation wRfx: NONREACTIVE

## 2021-12-18 LAB — MAGNESIUM: Magnesium: 1.7 mg/dL (ref 1.7–2.4)

## 2021-12-18 MED ORDER — CHLORHEXIDINE GLUCONATE 0.12 % MT SOLN
15.0000 mL | Freq: Two times a day (BID) | OROMUCOSAL | Status: DC
  Administered 2021-12-18 – 2021-12-25 (×13): 15 mL via OROMUCOSAL
  Filled 2021-12-18 (×10): qty 15

## 2021-12-18 MED ORDER — LACTATED RINGERS IV SOLN: INTRAVENOUS | Status: DC

## 2021-12-18 MED ORDER — POTASSIUM CHLORIDE 10 MEQ/100ML IV SOLN
10.0000 meq | INTRAVENOUS | Status: AC
  Administered 2021-12-18 (×6): 10 meq via INTRAVENOUS
  Filled 2021-12-18 (×6): qty 100

## 2021-12-18 MED ORDER — ORAL CARE MOUTH RINSE
15.0000 mL | Freq: Two times a day (BID) | OROMUCOSAL | Status: DC
  Administered 2021-12-18 – 2021-12-24 (×12): 15 mL via OROMUCOSAL

## 2021-12-18 MED ORDER — CHLORHEXIDINE GLUCONATE CLOTH 2 % EX PADS
6.0000 | MEDICATED_PAD | Freq: Every day | CUTANEOUS | Status: DC
  Administered 2021-12-18 – 2021-12-25 (×8): 6 via TOPICAL

## 2021-12-18 NOTE — Evaluation (Signed)
Physical Therapy Evaluation ?Patient Details ?Name: Cynthia Morrison ?MRN: [id number] ?DOB: [birth year] ?Today's Date: 12/18/2021 ? ?History of Present Illness ? Pt is a 66 y/o female admitted secondary to vomiting. Found to have incarcerated R groin hernia and is s/p repair on 3/28. PMH includes HTN and back surgery.  ?Clinical Impression ? Patient evaluated by Physical Therapy with no further acute PT needs identified. All education has been completed and the patient has no further questions. Pt overall at a mod I to supervision level with mobility tasks. No LOB noted. Educated about walking program and bracing to help with abdominal pain. Will defer further ambulation needs to mobility specialist. See below for any follow-up Physical Therapy or equipment needs. PT is signing off. Thank you for this referral. If needs change, please re-consult. ?   ?   ? ?Recommendations for follow up therapy are one component of a multi-disciplinary discharge planning process, led by the attending physician.  Recommendations may be updated based on patient status, additional functional criteria and insurance authorization. ? ?Follow Up Recommendations No PT follow up ? ?  ?Assistance Recommended at Discharge Intermittent Supervision/Assistance  ?Patient can return home with the following ? Assist for transportation;Assistance with cooking/housework ? ?  ?Equipment Recommendations None recommended by PT  ?Recommendations for Other Services ?    ?  ?Functional Status Assessment Patient has had a recent decline in their functional status and demonstrates the ability to make significant improvements in function in a reasonable and predictable amount of time.  ? ?  ?Precautions / Restrictions Precautions ?Precautions: Fall ?Precaution Comments: NG tube ?Restrictions ?Weight Bearing Restrictions: No  ? ?  ? ?Mobility ? Bed Mobility ?Overal bed mobility: Modified Independent ?  ?  ?  ?  ?  ?  ?  ?  ? ?Transfers ?Overall transfer level:  Modified independent ?  ?  ?  ?  ?  ?  ?  ?  ?  ?  ? ?Ambulation/Gait ?Ambulation/Gait assistance: Supervision ?Gait Distance (Feet): 250 Feet ?Assistive device: None ?Gait Pattern/deviations: Step-through pattern ?Gait velocity: Decreased ?  ?  ?General Gait Details: Overall steady gait. No LOB noted. Supervision for safety. ? ?Stairs ?  ?  ?  ?  ?  ? ?Wheelchair Mobility ?  ? ?Modified Rankin (Stroke Patients Only) ?  ? ?  ? ?Balance Overall balance assessment: No apparent balance deficits (not formally assessed) ?  ?  ?  ?  ?  ?  ?  ?  ?  ?  ?  ?  ?  ?  ?  ?  ?  ?  ?   ? ? ? ?Pertinent Vitals/Pain Pain Assessment ?Pain Assessment: Faces ?Faces Pain Scale: Hurts little more ?Pain Location: abdomen ?Pain Descriptors / Indicators: Grimacing, Guarding ?Pain Intervention(s): Limited activity within patient's tolerance, Monitored during session, Repositioned  ? ? ?Home Living Family/patient expects to be discharged to:: Private residence ?Living Arrangements: Children ?Available Help at Discharge: Family;Available 24 hours/day ?Type of Home: Apartment ?Home Access: Elevator ?  ?  ?  ?Home Layout: One level ?Home Equipment: None ?   ?  ?Prior Function Prior Level of Function : Independent/Modified Independent ?  ?  ?  ?  ?  ?  ?  ?  ?  ? ? ?Hand Dominance  ?   ? ?  ?Extremity/Trunk Assessment  ? Upper Extremity Assessment ?Upper Extremity Assessment: Overall WFL for tasks assessed ?  ? ?Lower Extremity Assessment ?Lower Extremity Assessment: Overall [facility]  for tasks assessed ?  ? ?Cervical / Trunk Assessment ?Cervical / Trunk Assessment: Other exceptions ?Cervical / Trunk Exceptions: s/p abdominal surgery  ?Communication  ? Communication: No difficulties  ?Cognition Arousal/Alertness: Awake/alert ?Behavior During Therapy: [facility] for tasks assessed/performed ?Overall Cognitive Status: Impaired/Different from baseline ?Area of Impairment: Problem solving ?  ?  ?  ?  ?  ?  ?  ?  ?  ?  ?  ?  ?  ?  ?Problem Solving: Slow  processing ?  ?  ?  ? ?  ?General Comments General comments (skin integrity, edema, etc.): Pt's daughter present. Educated about ambulating with mobility specialist ? ?  ?Exercises    ? ?Assessment/Plan  ?  ?PT Assessment Patient does not need any further PT services  ?PT Problem List   ? ?   ?  ?PT Treatment Interventions     ? ?PT Goals (Current goals can be found in the Care Plan section)  ?Acute Rehab PT Goals ?Patient Stated Goal: to go home ?PT Goal Formulation: With patient ?Time For Goal Achievement: 12/18/21 ?Potential to Achieve Goals: Good ? ?  ?Frequency   ?  ? ? ?Co-evaluation   ?  ?  ?  ?  ? ? ?  ?AM-PAC PT "6 Clicks" Mobility  ?Outcome Measure Help needed turning from your back to your side while in a flat bed without using bedrails?: None ?Help needed moving from lying on your back to sitting on the side of a flat bed without using bedrails?: None ?Help needed moving to and from a bed to a chair (including a wheelchair)?: None ?Help needed standing up from a chair using your arms (e.g., wheelchair or bedside chair)?: None ?Help needed to walk in hospital room?: None ?Help needed climbing 3-5 steps with a railing? : A Little ?6 Click Score: 23 ? ?  ?End of Session   ?Activity Tolerance: Patient tolerated treatment well ?Patient left: in bed;with call bell/phone within reach;with family/visitor present ?Nurse Communication: Mobility status ?PT Visit Diagnosis: Other abnormalities of gait and mobility (R26.89) ?  ? ?Time: 5397-6734 ?PT Time Calculation (min) (ACUTE ONLY): 13 min ? ? ?Charges:   PT Evaluation ?$PT Eval Low Complexity: 1 Low ?  ?  ?   ? ? ?Farley Ly, PT, DPT  ?Acute Rehabilitation Services  ?Pager: [phone] ?Office: [phone] ? ? ?[place] Margaree Sandhu ?12/18/2021, 4:10 PM ? ?

## 2021-12-18 NOTE — Progress Notes (Signed)
?Progress Note ? ?Patient: Cynthia Morrison AGT:[id number] DOB: 06-07-1956  ?DOA: 12/17/2021  DOS: 12/18/2021  ?  ?Brief hospital course: ?CLARISA DANSER is a 66 y.o. female with a history of HTN, depression/anxiety who presented to the ED with nausea and vomiting worsening for a few days with associated new slightly tender lump in the right groin that she couldn't push back in. She was found to have acute renal failure with granular casts and an incarcerated, strangulated right femoral hernia for which she was taken emergently for laparoscopic right femoral hernia repair with mesh and required resection of ischemic small bowel thru midline incision. Lactic acid improved 6.1 > 1.9, creatinine has stabilized with GFR ~6-51ml/min.  ? ?Assessment and Plan: ?Strangulated right femoral hernia s/p repair with mesh, small bowel resection 3/28 by Dr. Derrell Lolling:  ?- Postoperative management per surgery.  ?- Continuing NGT to LIWS as evidence of postoperative SBO/ileus on XR and output is still significant, no return of bowel function just yet. OOB encouraged, opioid medication to be minimized.  ? ?ARF due to ATN and prerenal azotemia: Hyaline and granular casts on micro. 6-10 RBCs, +ketones, protein. Renal U/S without findings suggestive of obstructive uropathy or chronic renal disease. Only prior Cr is from >5 years ago and was 0.79. Pt has hx HTN but no other RF's for CKD. Suspect this is all acute.  ?- Nonoliguric at this point and relative Cr stability is reassuring. No indications for urgent HD. Will treat supportively with IV fluids to at least keep up with GI losses. She received >30cc/kg LR bolus in ED, then 150cc/hr, will continue at 125cc/hr and titrate to keep at least net even w/NG output. No evidence of overload.  ?- Avoid hypotension and nephrotoxins, namely contrast and NSAIDs.  ?- To facilitate strict I/O while in acute phase, would prefer to continue foley for now. Pt on ceftriaxone regardless.  ? ?Hypokalemia:  Related to poor po intake PTA but also significant GI losses ongoing.  ?- Supplement fairly aggressively expecting continued GI losses despite concern for impaired renal handling. Recheck metabolic panel this PM to inform further management.  ? ?Recently diagnosed with E. coli UTI (3/24):  ?- Continue ceftriaxone (also received flagyl for intraabdominal source). Got perioperative cefotetan as well.  ?- Monitor blood and urine cultures.  ? ?Subjective: Pain is controlled, wants to get OOB. No dyspnea. Having some urine output overnight thru foley. No blood. Wants jello, no flatus or BM. ? ?Objective: ?Vitals:  ? 12/18/21 0115 12/18/21 0301 12/18/21 0709 12/18/21 1621  ?BP:  110/69 103/65 117/69  ?Pulse: 93 93 80 86  ?Resp:  17 16 17   ?Temp:  98 ?F (36.7 ?C) 98.1 ?F (36.7 ?C) 98.2 ?F (36.8 ?C)  ?TempSrc:  Oral Oral Oral  ?SpO2: 93% 94% 94% 93%  ?Weight:      ?Height:      ? ?Gen:  66 y.o. female in no distress ?Pulm: Nonlabored breathing room air.  ?CV: Regular rate and rhythm. No murmur, rub, or gallop. No JVD, no dependent edema. ?GI: Abdomen soft, appropriately tender in lower quadrants, incisionally w/c/d/I honeycomb and well-apposed wound edges. Very hypoactive bowel sounds. Thick, dark/brown output in canister.   ?Ext: Warm, no deformities ?Skin: No rashes, lesions or ulcers on visualized skin. ?Neuro: Alert and oriented. No focal neurological deficits. ?Psych: Judgement and insight appear fair. Mood euthymic & affect congruent. Behavior is appropriate.   ? ?Data Personally reviewed: ? ?CBC: ?Recent Labs  ?Lab 12/17/21 ?1348 12/18/21 ?0129  ?  WBC 10.5 6.7  ?NEUTROABS 7.8*  --   ?HGB 18.4* 13.3  ?HCT 53.5* 39.1  ?MCV 99.3 99.0  ?PLT 279 193  ? ?Basic Metabolic Panel: ?Recent Labs  ?Lab 12/17/21 ?1540 12/18/21 ?0129  ?NA 132* 134*  ?K 2.9* 2.8*  ?CL 78* 85*  ?CO2 32 31  ?GLUCOSE 107* 127*  ?BUN 64* 71*  ?CREATININE 5.36* 5.96*  ?CALCIUM 7.9* 6.9*  ?MG  --  1.7  ? ?GFR: ?Estimated Creatinine Clearance: 6.9 mL/min  (A) (by C-G formula based on SCr of 5.96 mg/dL (H)). ?Liver Function Tests: ?Recent Labs  ?Lab 12/17/21 ?1540 12/18/21 ?0129  ?AST 38 29  ?ALT 39 31  ?ALKPHOS 60 54  ?BILITOT 0.4 0.5  ?PROT 5.9* 5.4*  ?ALBUMIN 2.8* 2.8*  ? ?No results for input(s): LIPASE, AMYLASE in the last 168 hours. ?No results for input(s): AMMONIA in the last 168 hours. ?Coagulation Profile: ?Recent Labs  ?Lab 12/17/21 ?1348  ?INR 1.1  ? ?Cardiac Enzymes: ?No results for input(s): CKTOTAL, CKMB, CKMBINDEX, TROPONINI in the last 168 hours. ?BNP (last 3 results) ?No results for input(s): PROBNP in the last 8760 hours. ?HbA1C: ?No results for input(s): HGBA1C in the last 72 hours. ?CBG: ?No results for input(s): GLUCAP in the last 168 hours. ?Lipid Profile: ?No results for input(s): CHOL, HDL, LDLCALC, TRIG, CHOLHDL, LDLDIRECT in the last 72 hours. ?Thyroid Function Tests: ?No results for input(s): TSH, T4TOTAL, FREET4, T3FREE, THYROIDAB in the last 72 hours. ?Anemia Panel: ?No results for input(s): VITAMINB12, FOLATE, FERRITIN, TIBC, IRON, RETICCTPCT in the last 72 hours. ?Urine analysis: ?   ?Component Value Date/Time  ? COLORURINE YELLOW 12/18/2021 0305  ? APPEARANCEUR CLOUDY (A) 12/18/2021 0305  ? LABSPEC 1.015 12/18/2021 0305  ? PHURINE 5.0 12/18/2021 0305  ? GLUCOSEU 150 (A) 12/18/2021 0305  ? HGBUR MODERATE (A) 12/18/2021 0305  ? BILIRUBINUR NEGATIVE 12/18/2021 0305  ? KETONESUR 5 (A) 12/18/2021 0305  ? PROTEINUR 100 (A) 12/18/2021 0305  ? UROBILINOGEN 0.2 12/13/2021 1112  ? NITRITE NEGATIVE 12/18/2021 0305  ? LEUKOCYTESUR TRACE (A) 12/18/2021 0305  ? ?Recent Results (from the past 240 hour(s))  ?Urine Culture     Status: Abnormal  ? Collection Time: 12/13/21 11:17 AM  ? Specimen: Urine, Clean Catch  ?Result Value Ref Range Status  ? Specimen Description URINE, CLEAN CATCH  Final  ? Special Requests   Final  ?  NONE ?Performed at [facility], [address], [place], [place] [address] ?  ? Culture >=100,000 COLONIES/mL ESCHERICHIA  COLI (A)  Final  ? Report Status 12/16/2021 FINAL  Final  ? Organism ID, Bacteria ESCHERICHIA COLI (A)  Final  ?    Susceptibility  ? Escherichia coli - MIC*  ?  AMPICILLIN >=32 RESISTANT Resistant   ?  CEFAZOLIN <=4 SENSITIVE Sensitive   ?  CEFEPIME <=0.12 SENSITIVE Sensitive   ?  CEFTRIAXONE <=0.25 SENSITIVE Sensitive   ?  CIPROFLOXACIN <=0.25 SENSITIVE Sensitive   ?  GENTAMICIN <=1 SENSITIVE Sensitive   ?  IMIPENEM <=0.25 SENSITIVE Sensitive   ?  NITROFURANTOIN <=16 SENSITIVE Sensitive   ?  TRIMETH/SULFA <=20 SENSITIVE Sensitive   ?  AMPICILLIN/SULBACTAM >=32 RESISTANT Resistant   ?  PIP/TAZO >=128 RESISTANT Resistant   ?  * >=100,000 COLONIES/mL ESCHERICHIA COLI  ?Resp Panel by RT-PCR (Flu A&B, Covid) Nasopharyngeal Swab     Status: None  ? Collection Time: 12/17/21  1:25 PM  ? Specimen: Nasopharyngeal Swab; Nasopharyngeal(NP) swabs in vial transport medium  ?Result  Value Ref Range Status  ? SARS Coronavirus 2 by RT PCR NEGATIVE NEGATIVE Final  ?  Comment: (NOTE) ?SARS-CoV-2 target nucleic acids are NOT DETECTED. ? ?The SARS-CoV-2 RNA is generally detectable in upper respiratory ?specimens during the acute phase of infection. The lowest ?concentration of SARS-CoV-2 viral copies this assay can detect is ?138 copies/mL. A negative result does not preclude SARS-Cov-2 ?infection and should not be used as the sole basis for treatment or ?other patient management decisions. A negative result may occur with  ?improper specimen collection/handling, submission of specimen other ?than nasopharyngeal swab, presence of viral mutation(s) within the ?areas targeted by this assay, and inadequate number of viral ?copies(<138 copies/mL). A negative result must be combined with ?clinical observations, patient history, and epidemiological ?information. The expected result is Negative. ? ?Fact Sheet for Patients:  ?BloggerCourse.comhttps://www.fda.gov/media/152166/download ? ?Fact Sheet for Healthcare Providers:   ?SeriousBroker.ithttps://www.fda.gov/media/152162/download ? ?This test is no t yet approved or cleared by the [place] FDA and  ?has been authorized for detection and/or diagnosis of SARS-CoV-2 by ?FDA under an Emergency Use Authorization (EUA). This EU

## 2021-12-18 NOTE — Progress Notes (Signed)
[facility] ?Progress Note ? ?1 Day Post-Op  ?Subjective: ?CC:  ?Overall feels better. Alert and pain controlled -reports incisional tenderness. Denies flatus or BM since surgery. NGT in place. Foley in place. Has not been OOB.  ? ?Objective: ?Vital signs in last 24 hours: ?Temp:  [98 ?F (36.7 ?C)-98.4 ?F (36.9 ?C)] 98.1 ?F (36.7 ?C) (03/29 1962) ?Pulse Rate:  [55-132] 80 (03/29 0709) ?Resp:  [16-35] 16 (03/29 0709) ?BP: (63-117)/(52-79) 103/65 (03/29 0709) ?SpO2:  [86 %-100 %] 94 % (03/29 0709) ?Weight:  [54.4 kg] 54.4 kg (03/28 1928) ?Last BM Date : 12/14/21 (per pt self-report) ? ?Intake/Output from previous day: ?03/28 0701 - 03/29 0700 ?In: 3571.9 [I.V.:1238.1; IV Piggyback:2333.8] ?Out: 1150 [Urine:60; Emesis/NG output:475; Blood:15] ?Intake/Output this shift: ?No intake/output data recorded. ? ?PE: ?Gen:  Alert, NAD, pleasant, non-toxic appearing ?Card:  Regular rate and rhythm, no peripheral edema ?Pulm:  Normal effort ORA ?Abd: Soft, appropriately tender, lower midline incision with honeycomb in place, c/d/I ? NGT in place with ~800cc thick, dark bilious fluid in cannister ?GU: clear, yellow urine; 75 cc in foley bag.  ?Skin: warm and dry, no rashes  ?Psych: A&Ox3  ? ?Lab Results:  ?Recent Labs  ?  12/17/21 ?1348 12/18/21 ?0129  ?WBC 10.5 6.7  ?HGB 18.4* 13.3  ?HCT 53.5* 39.1  ?PLT 279 193  ? ?BMET ?Recent Labs  ?  12/17/21 ?1540 12/18/21 ?0129  ?NA 132* 134*  ?K 2.9* 2.8*  ?CL 78* 85*  ?CO2 32 31  ?GLUCOSE 107* 127*  ?BUN 64* 71*  ?CREATININE 5.36* 5.96*  ?CALCIUM 7.9* 6.9*  ? ?PT/INR ?Recent Labs  ?  12/17/21 ?1348  ?LABPROT 13.7  ?INR 1.1  ? ?CMP  ?   ?Component Value Date/Time  ? NA 134 (L) 12/18/2021 0129  ? K 2.8 (L) 12/18/2021 0129  ? CL 85 (L) 12/18/2021 0129  ? CO2 31 12/18/2021 0129  ? GLUCOSE 127 (H) 12/18/2021 0129  ? BUN 71 (H) 12/18/2021 0129  ? CREATININE 5.96 (H) 12/18/2021 0129  ? CALCIUM 6.9 (L) 12/18/2021 0129  ? PROT 5.4 (L) 12/18/2021 0129  ? ALBUMIN 2.8 (L) 12/18/2021 0129   ? AST 29 12/18/2021 0129  ? ALT 31 12/18/2021 0129  ? ALKPHOS 54 12/18/2021 0129  ? BILITOT 0.5 12/18/2021 0129  ? GFRNONAA 7 (L) 12/18/2021 0129  ? GFRAA >60 10/24/2015 1308  ? ?Lipase  ?No results found for: LIPASE ? ? ? ? ?Studies/Results: ?US RENAL ? ?Result Date: 12/17/2021 ?CLINICAL DATA:  Acute kidney injury EXAM: RENAL / URINARY TRACT ULTRASOUND COMPLETE COMPARISON:  None. FINDINGS: Right Kidney: Renal measurements: 9.3 by 5.4 by 4.9 cm = volume: 128 mL. Echogenicity within normal limits. No mass or hydronephrosis visualized. Left Kidney: Renal measurements: 9.2 by 4.7 by 5.1 cm= volume: 120 mL. Echogenicity within normal limits. No mass or hydronephrosis visualized. Bladder: Appears normal for degree of bladder distention. Other: None. IMPRESSION: 1. No specific sonographic abnormality to correlate with the patient's acute kidney injury. Electronically Signed   By: Gaylyn Rong M.D.   On: 12/17/2021 18:32  ? ?DG Chest Port 1 View ? ?Result Date: 12/17/2021 ?CLINICAL DATA:  Sepsis. EXAM: PORTABLE CHEST 1 VIEW COMPARISON:  10/24/2015 FINDINGS: The cardiac silhouette, mediastinal and hilar contours are normal. The lungs are clear. No pleural effusions. No pulmonary lesions. Air-filled loops of small bowel are noted in the upper abdomen. The bony thorax is intact. IMPRESSION: No acute cardiopulmonary findings. Electronically Signed   By: Orlene Plum.D.  On: 12/17/2021 14:25  ? ?DG Abd Portable 1V ? ?Result Date: 12/17/2021 ?CLINICAL DATA:  OG tube placement EXAM: PORTABLE ABDOMEN - 1 VIEW COMPARISON:  Radiograph 12/13/2021 FINDINGS: Enteric tube tip and side port overlie the stomach. There are multiple loops of dilated small bowel. There are staples overlying the lower abdomen. IMPRESSION: Enteric tube tip and side port overlie the stomach. Multiple loops of dilated small bowel, concerning for small-bowel obstruction. Electronically Signed   By: Caprice Renshaw M.D.   On: 12/17/2021 21:53    ? ?Anti-infectives: ?Anti-infectives (From admission, onward)  ? ? Start     Dose/Rate Route Frequency Ordered Stop  ? 12/18/21 0800  cefoTEtan (CEFOTAN) 2 g in sodium chloride 0.9 % 100 mL IVPB       ? 2 g ?200 mL/hr over 30 Minutes Intravenous  Once 12/17/21 2132    ? 12/17/21 2115  metroNIDAZOLE (FLAGYL) IVPB 500 mg       ? 500 mg ?100 mL/hr over 60 Minutes Intravenous  Once 12/17/21 2105 12/18/21 0019  ? 12/17/21 1330  cefTRIAXone (ROCEPHIN) 2 g in sodium chloride 0.9 % 100 mL IVPB       ? 2 g ?200 mL/hr over 30 Minutes Intravenous Every 24 hours 12/17/21 1326 12/24/21 1329  ? ?  ? ? ?Assessment/Plan ?Incarcerated right femoral hernia ?POD#1 s/p LAPAROSCOPIC RIGHT femoral HERNIA REPAIR WITH MESH, TEP (Right) Exploratory laparotomy, small bowel resection with anastomosis 3/28 Dr. Derrell Lolling ?- afebrile, VSS ?- continue NG to LIWS and await return of bowel function ?- mobilize with PT ? ?FEN: NPO, NGT to LIWS; hypokalemia (2.8) being replaced by medical team, Mg 1.7 ?ID: Cefotan peri-op 3/28, Rocephin 3/28 >> for UTI ?Foley: in place, would continue today given acute renal failure and low UOP ?Dispo: med-surg, await return of bowel function  ? ?Below per primary team-- ?AKI - BUN 71, Creatinine 5.96, UOP 60 documented (75 cc in foley bag) ?HTN ?E.coli UTI, diagnosed 3/24; repeat urine culture is pending  ? ? ?I reviewed nursing notes, hospitalist notes, last 24 h vitals and pain scores, last 48 h intake and output, last 24 h labs and trends, and last 24 h imaging results. ? ? ?Hosie Spangle, PA-C ?[facility] ?Please see Amion for pager number during day hours 7:00am-4:30pm ? ? ? ? ? ? ?

## 2021-12-18 NOTE — Plan of Care (Signed)

## 2021-12-18 NOTE — Progress Notes (Signed)
Mobility Specialist Progress Note: ? ? 12/18/21 1350  ?Mobility  ?Activity Ambulated with assistance in hallway  ?Level of Assistance Contact guard assist, steadying assist  ?Assistive Device  ?(HHA)  ?Distance Ambulated (ft) 550 ft  ?Activity Response Tolerated well  ?$Mobility charge 1 Mobility  ? ?Second walk of the Emmie Frakes. No complaints of pain. Left in bed with call bell in reach and all needs met.  ? ?Lilliana Turner ?Mobility Specialist ?Primary Phone [phone] ? ?

## 2021-12-18 NOTE — Progress Notes (Signed)
Mobility Specialist Progress Note: ? ? 12/18/21 1201  ?Mobility  ?Activity Ambulated with assistance in hallway  ?Level of Assistance Contact guard assist, steadying assist  ?Assistive Device None  ?Distance Ambulated (ft) 400 ft  ?Activity Response Tolerated well  ?$Mobility charge 1 Mobility  ? ?Pt received in bed willing to participate in mobility. No complaints of pain. Left in bed with call bell in reach and all needs met.  ? ?Cynthia Morrison ?Mobility Specialist ?Primary Phone [phone] ? ?

## 2021-12-19 DIAGNOSIS — N179 Acute kidney failure, unspecified: Secondary | ICD-10-CM | POA: Diagnosis not present

## 2021-12-19 DIAGNOSIS — K4130 Unilateral femoral hernia, with obstruction, without gangrene, not specified as recurrent: Secondary | ICD-10-CM | POA: Diagnosis not present

## 2021-12-19 LAB — SURGICAL PATHOLOGY

## 2021-12-19 LAB — PROTEIN / CREATININE RATIO, URINE
Creatinine, Urine: 77.06 mg/dL
Protein Creatinine Ratio: 1.73 mg/mg{Cre} — ABNORMAL HIGH (ref 0.00–0.15)
Total Protein, Urine: 133 mg/dL

## 2021-12-19 LAB — BASIC METABOLIC PANEL
Anion gap: 22 — ABNORMAL HIGH (ref 5–15)
BUN: 79 mg/dL — ABNORMAL HIGH (ref 8–23)
CO2: 22 mmol/L (ref 22–32)
Calcium: 7.1 mg/dL — ABNORMAL LOW (ref 8.9–10.3)
Chloride: 92 mmol/L — ABNORMAL LOW (ref 98–111)
Creatinine, Ser: 5.56 mg/dL — ABNORMAL HIGH (ref 0.44–1.00)
GFR, Estimated: 8 mL/min — ABNORMAL LOW (ref >60)
Glucose, Bld: 72 mg/dL (ref 70–99)
Potassium: 3.6 mmol/L (ref 3.5–5.1)
Sodium: 136 mmol/L (ref 135–145)

## 2021-12-19 LAB — URINALYSIS, COMPLETE (UACMP) WITH MICROSCOPIC
Bacteria, UA: NONE SEEN
Bilirubin Urine: NEGATIVE
Glucose, UA: 250 mg/dL — AB
Ketones, ur: >80 mg/dL — AB
Leukocytes,Ua: NEGATIVE
Nitrite: NEGATIVE
Protein, ur: 100 mg/dL — AB
Specific Gravity, Urine: 1.02 (ref 1.005–1.030)
pH: 6 (ref 5.0–8.0)

## 2021-12-19 LAB — URINE CULTURE: Culture: NO GROWTH

## 2021-12-19 LAB — SODIUM, URINE, RANDOM: Sodium, Ur: 58 mmol/L

## 2021-12-19 MED ORDER — ACETAMINOPHEN 10 MG/ML IV SOLN
1000.0000 mg | Freq: Four times a day (QID) | INTRAVENOUS | Status: AC
  Administered 2021-12-19 – 2021-12-20 (×4): 1000 mg via INTRAVENOUS
  Filled 2021-12-19 (×4): qty 100

## 2021-12-19 MED ORDER — POTASSIUM CHLORIDE 10 MEQ/100ML IV SOLN
10.0000 meq | INTRAVENOUS | Status: DC
  Filled 2021-12-19: qty 100

## 2021-12-19 MED ORDER — PHENOL 1.4 % MT LIQD
1.0000 | OROMUCOSAL | Status: DC | PRN
  Filled 2021-12-19: qty 177

## 2021-12-19 NOTE — Progress Notes (Signed)
She's had about 1026mls output from her ng tube in the past four hours (8-12pm) , she has been waiting for her ice chips to melt to drink them as well as collecting ice chips from different staff members. All fluids have been removed aside from the ginger ale the physician has given her and education was provided to the patient. Staff was instructed to ask nurse before giving fluids ?

## 2021-12-19 NOTE — Progress Notes (Signed)
?Progress Note ? ?Patient: Cynthia Morrison ZOX:[id number] DOB: 11/24/1955  ?DOA: 12/17/2021  DOS: 12/19/2021  ?  ?Brief hospital course: ?Cynthia Morrison is a 66 y.o. female with a history of HTN, depression/anxiety who presented to the ED with nausea and vomiting worsening for a few days with associated new slightly tender lump in the right groin that she couldn't push back in. She was found to have acute renal failure with granular casts and an incarcerated, strangulated right femoral hernia for which she was taken emergently for laparoscopic right femoral hernia repair with mesh and required resection of ischemic small bowel thru midline incision. Lactic acid improved 6.1 > 1.9, creatinine has stabilized with GFR ~6-357ml/min.  ? ?Assessment and Plan: ?Strangulated right femoral hernia s/p repair with mesh, small bowel resection 3/28 by Dr. Derrell Lollingamirez:  ?- Postoperative management per surgery.  ?- Continuing NGT to LIWS as evidence of postoperative SBO/ileus on XR and output is still significant, no return of bowel function just yet. OOB encouraged and patient is getting up. Minimize opioids, on dilaudid due to renal impairment. ? ?ARF due to ATN and prerenal azotemia: Hyaline and granular casts on micro. 6-10 RBCs, +ketones, protein. Renal U/S without findings suggestive of obstructive uropathy or chronic renal disease. Only prior Cr is from >5 years ago and was 0.79. Pt has hx HTN but no other RF's for CKD. On NSAID but not often and was taking ARB. Suspect this is all acute.  ?- ATN can often take a few days to turn around. Important not to add additional insults at this point. Continue monitoring daily. No indications for urgent HD.  ?- Significant GI volume loss. with UOP came to net negative 87cc yesterday. Will increase LR modestly. No overload.   ?- Avoid hypotension and nephrotoxins, namely contrast and NSAIDs. Holding home losartan. ?- To facilitate strict I/O while in acute phase, would prefer to continue foley  for now. Pt on ceftriaxone regardless. Anticipate DC foley 3/31. ? ?Hypokalemia: Related to poor po intake PTA but also significant GI losses ongoing.  ?- Supplemented fairly aggressively, improved to 3.6 last night. With no change in creatinine between those checks, opted not to give more K and it is flat at 3.6 again this AM. Certainly she's having GI loss, but with also having declining bicarb, suspect dramatically impaired renal excretion of solute. Would stay the course with LR only and monitor in AM.  ? ?Recently diagnosed with E. coli UTI (3/24):  ?- Continue ceftriaxone (also received flagyl for intraabdominal source). Got perioperative cefotetan as well.  ?- Monitor blood and urine cultures.  ? ?Subjective: Getting up regularly, no flatus or BM, minimal abd pain controlled with dilaudid. No changes to pain. Having fair urine output. ? ?Objective: ?Vitals:  ? 12/18/21 1621 12/18/21 2050 12/19/21 [id number] 12/19/21 0813  ?BP: 117/69 119/71 137/69 128/77  ?Pulse: 86 84 87 83  ?Resp: 17 17 17 17   ?Temp: 98.2 ?F (36.8 ?C) 98.9 ?F (37.2 ?C) 98.3 ?F (36.8 ?C) 98.7 ?F (37.1 ?C)  ?TempSrc: Oral Oral Oral Oral  ?SpO2: 93% 90% 95% 96%  ?Weight:      ?Height:      ? ?Gen: 66 y.o. female in no distress ?Pulm: Nonlabored breathing room air. Clear. ?CV: Regular rate and rhythm. No murmur, rub, or gallop. No JVD, no dependent edema. ?GI: Abdomen soft, appropriately tender, non-distended, with hypoactive bowel sounds.  ?Ext: Warm, no deformities ?Skin: No new rashes, lesions or ulcers on visualized skin. ?Neuro: Alert  and oriented. No focal neurological deficits. ?Psych: Judgement and insight appear fair. Mood euthymic & affect congruent. Behavior is appropriate.   ? ?Data Personally reviewed: ? ?CBC: ?Recent Labs  ?Lab 12/17/21 ?1348 12/18/21 ?0129  ?WBC 10.5 6.7  ?NEUTROABS 7.8*  --   ?HGB 18.4* 13.3  ?HCT 53.5* 39.1  ?MCV 99.3 99.0  ?PLT 279 193  ? ?Basic Metabolic Panel: ?Recent Labs  ?Lab 12/17/21 ?1540 12/18/21 ?0129  12/18/21 ?1929 12/19/21 ?0113  ?NA 132* 134* 135 136  ?K 2.9* 2.8* 3.6 3.6  ?CL 78* 85* 89* 92*  ?CO2 32 31 26 22   ?GLUCOSE 107* 127* 85 72  ?BUN 64* 71* 79* 79*  ?CREATININE 5.36* 5.96* 5.86* 5.56*  ?CALCIUM 7.9* 6.9* 7.0* 7.1*  ?MG  --  1.7  --   --   ? ?GFR: ?Estimated Creatinine Clearance: 7.4 mL/min (A) (by C-G formula based on SCr of 5.56 mg/dL (H)). ?Liver Function Tests: ?Recent Labs  ?Lab 12/17/21 ?1540 12/18/21 ?0129  ?AST 38 29  ?ALT 39 31  ?ALKPHOS 60 54  ?BILITOT 0.4 0.5  ?PROT 5.9* 5.4*  ?ALBUMIN 2.8* 2.8*  ? ?No results for input(s): LIPASE, AMYLASE in the last 168 hours. ?No results for input(s): AMMONIA in the last 168 hours. ?Coagulation Profile: ?Recent Labs  ?Lab 12/17/21 ?1348  ?INR 1.1  ? ?Cardiac Enzymes: ?No results for input(s): CKTOTAL, CKMB, CKMBINDEX, TROPONINI in the last 168 hours. ?BNP (last 3 results) ?No results for input(s): PROBNP in the last 8760 hours. ?HbA1C: ?No results for input(s): HGBA1C in the last 72 hours. ?CBG: ?No results for input(s): GLUCAP in the last 168 hours. ?Lipid Profile: ?No results for input(s): CHOL, HDL, LDLCALC, TRIG, CHOLHDL, LDLDIRECT in the last 72 hours. ?Thyroid Function Tests: ?No results for input(s): TSH, T4TOTAL, FREET4, T3FREE, THYROIDAB in the last 72 hours. ?Anemia Panel: ?No results for input(s): VITAMINB12, FOLATE, FERRITIN, TIBC, IRON, RETICCTPCT in the last 72 hours. ?Urine analysis: ?   ?Component Value Date/Time  ? COLORURINE YELLOW 12/19/2021 0739  ? APPEARANCEUR CLOUDY (A) 12/19/2021 0739  ? LABSPEC 1.020 12/19/2021 0739  ? PHURINE 6.0 12/19/2021 0739  ? GLUCOSEU 250 (A) 12/19/2021 0739  ? HGBUR MODERATE (A) 12/19/2021 0739  ? BILIRUBINUR NEGATIVE 12/19/2021 0739  ? KETONESUR >80 (A) 12/19/2021 0739  ? PROTEINUR 100 (A) 12/19/2021 0739  ? UROBILINOGEN 0.2 12/13/2021 1112  ? NITRITE NEGATIVE 12/19/2021 0739  ? LEUKOCYTESUR NEGATIVE 12/19/2021 0739  ? ?Recent Results (from the past 240 hour(s))  ?Urine Culture     Status: Abnormal  ?  Collection Time: 12/13/21 11:17 AM  ? Specimen: Urine, Clean Catch  ?Result Value Ref Range Status  ? Specimen Description URINE, CLEAN CATCH  Final  ? Special Requests   Final  ?  NONE ?Performed at [facility], [address], [place], [place] ?  ? Culture >=100,000 COLONIES/mL ESCHERICHIA COLI (A)  Final  ? Report Status 12/16/2021 FINAL  Final  ? Organism ID, Bacteria ESCHERICHIA COLI (A)  Final  ?    Susceptibility  ? Escherichia coli - MIC*  ?  AMPICILLIN >=32 RESISTANT Resistant   ?  CEFAZOLIN <=4 SENSITIVE Sensitive   ?  CEFEPIME <=0.12 SENSITIVE Sensitive   ?  CEFTRIAXONE <=0.25 SENSITIVE Sensitive   ?  CIPROFLOXACIN <=0.25 SENSITIVE Sensitive   ?  GENTAMICIN <=1 SENSITIVE Sensitive   ?  IMIPENEM <=0.25 SENSITIVE Sensitive   ?  NITROFURANTOIN <=16 SENSITIVE Sensitive   ?  TRIMETH/SULFA <=20 SENSITIVE Sensitive   ?  AMPICILLIN/SULBACTAM >=32 RESISTANT Resistant   ?  PIP/TAZO >=128 RESISTANT Resistant   ?  * >=100,000 COLONIES/mL ESCHERICHIA COLI  ?Urine Culture     Status: None  ? Collection Time: 12/17/21  1:24 PM  ? Specimen: Urine, Clean Catch  ?Result Value Ref Range Status  ? Specimen Description URINE, CLEAN CATCH  Final  ? Special Requests NONE  Final  ? Culture   Final  ?  NO GROWTH ?Performed at [facility], [address] [place], [place] [address] ?  ? Report Status 12/19/2021 FINAL  Final  ?Resp Panel by RT-PCR (Flu A&B, Covid) Nasopharyngeal Swab     Status: None  ? Collection Time: 12/17/21  1:25 PM  ? Specimen: Nasopharyngeal Swab; Nasopharyngeal(NP) swabs in vial transport medium  ?Result Value Ref Range Status  ? SARS Coronavirus 2 by RT PCR NEGATIVE NEGATIVE Final  ?  Comment: (NOTE) ?SARS-CoV-2 target nucleic acids are NOT DETECTED. ? ?The SARS-CoV-2 RNA is generally detectable in upper respiratory ?specimens during the acute phase of infection. The lowest ?concentration of SARS-CoV-2 viral copies this assay can detect is ?138 copies/mL. A negative result does not  preclude SARS-Cov-2 ?infection and should not be used as the sole basis for treatment or ?other patient management decisions. A negative result may occur with  ?improper specimen collection/handling, submission

## 2021-12-19 NOTE — Progress Notes (Signed)
[facility] ?Progress Note ? ?2 Days Post-Op  ?Subjective: ?CC:  ?Pain controlled. States she walked in hall 3x yesterday. Asking for ginger ale. Denies flatus/BM. Endorses stomach "rumbling". Denies nausea.  ? ?Objective: ?Vital signs in last 24 hours: ?Temp:  [98.2 ?F (36.8 ?C)-98.9 ?F (37.2 ?C)] 98.7 ?F (37.1 ?C) (03/30 0813) ?Pulse Rate:  [83-87] 83 (03/30 0813) ?Resp:  [17] 17 (03/30 0813) ?BP: (117-137)/(69-77) 128/77 (03/30 0813) ?SpO2:  [90 %-96 %] 96 % (03/30 0813) ?Last BM Date : 12/13/21 ? ?Intake/Output from previous day: ?03/29 0701 - 03/30 0700 ?In: 2501.1 [I.V.:1750.8; NG/GT:650; IV Piggyback:100.4] ?Out: 2960 [Urine:1450; Emesis/NG output:1510] ?Intake/Output this shift: ?No intake/output data recorded. ? ?PE: ?Gen:  Alert, NAD ?Pulm:  Normal effort ORA ?Abd: Soft, appropriately tender, lower midline incision with honeycomb in place, c/d/I ? NGT in place with ~50cc dark bilious fluid in cannister. NG hooked up incorrectly. I fixed NG tube and got it working with an additional 100cc bilious fluid return. ?GU: clear, yellow urine;1440cc/24h ?Skin: warm and dry, no rashes  ?Psych: A&Ox3  ? ?Lab Results:  ?Recent Labs  ?  12/17/21 ?1348 12/18/21 ?0129  ?WBC 10.5 6.7  ?HGB 18.4* 13.3  ?HCT 53.5* 39.1  ?PLT 279 193  ? ?BMET ?Recent Labs  ?  12/18/21 ?1929 12/19/21 ?0113  ?NA 135 136  ?K 3.6 3.6  ?CL 89* 92*  ?CO2 26 22  ?GLUCOSE 85 72  ?BUN 79* 79*  ?CREATININE 5.86* 5.56*  ?CALCIUM 7.0* 7.1*  ? ?PT/INR ?Recent Labs  ?  12/17/21 ?1348  ?LABPROT 13.7  ?INR 1.1  ? ?CMP  ?   ?Component Value Date/Time  ? NA 136 12/19/2021 0113  ? K 3.6 12/19/2021 0113  ? CL 92 (L) 12/19/2021 0113  ? CO2 22 12/19/2021 0113  ? GLUCOSE 72 12/19/2021 0113  ? BUN 79 (H) 12/19/2021 0113  ? CREATININE 5.56 (H) 12/19/2021 0113  ? CALCIUM 7.1 (L) 12/19/2021 0113  ? PROT 5.4 (L) 12/18/2021 0129  ? ALBUMIN 2.8 (L) 12/18/2021 0129  ? AST 29 12/18/2021 0129  ? ALT 31 12/18/2021 0129  ? ALKPHOS 54 12/18/2021 0129  ? BILITOT  0.5 12/18/2021 0129  ? GFRNONAA 8 (L) 12/19/2021 0113  ? GFRAA >60 10/24/2015 1308  ? ?Lipase  ?No results found for: LIPASE ? ? ? ? ?Studies/Results: ?US RENAL ? ?Result Date: 12/17/2021 ?CLINICAL DATA:  Acute kidney injury EXAM: RENAL / URINARY TRACT ULTRASOUND COMPLETE COMPARISON:  None. FINDINGS: Right Kidney: Renal measurements: 9.3 by 5.4 by 4.9 cm = volume: 128 mL. Echogenicity within normal limits. No mass or hydronephrosis visualized. Left Kidney: Renal measurements: 9.2 by 4.7 by 5.1 cm= volume: 120 mL. Echogenicity within normal limits. No mass or hydronephrosis visualized. Bladder: Appears normal for degree of bladder distention. Other: None. IMPRESSION: 1. No specific sonographic abnormality to correlate with the patient's acute kidney injury. Electronically Signed   By: Gaylyn Rong M.D.   On: 12/17/2021 18:32  ? ?DG Chest Port 1 View ? ?Result Date: 12/17/2021 ?CLINICAL DATA:  Sepsis. EXAM: PORTABLE CHEST 1 VIEW COMPARISON:  10/24/2015 FINDINGS: The cardiac silhouette, mediastinal and hilar contours are normal. The lungs are clear. No pleural effusions. No pulmonary lesions. Air-filled loops of small bowel are noted in the upper abdomen. The bony thorax is intact. IMPRESSION: No acute cardiopulmonary findings. Electronically Signed   By: Rudie Meyer M.D.   On: 12/17/2021 14:25  ? ?DG Abd Portable 1V ? ?Result Date: 12/17/2021 ?CLINICAL DATA:  OG tube  placement EXAM: PORTABLE ABDOMEN - 1 VIEW COMPARISON:  Radiograph 12/13/2021 FINDINGS: Enteric tube tip and side port overlie the stomach. There are multiple loops of dilated small bowel. There are staples overlying the lower abdomen. IMPRESSION: Enteric tube tip and side port overlie the stomach. Multiple loops of dilated small bowel, concerning for small-bowel obstruction. Electronically Signed   By: Caprice Renshaw M.D.   On: 12/17/2021 21:53   ? ?Anti-infectives: ?Anti-infectives (From admission, onward)  ? ? Start     Dose/Rate Route Frequency  Ordered Stop  ? 12/18/21 0800  cefoTEtan (CEFOTAN) 2 g in sodium chloride 0.9 % 100 mL IVPB       ? 2 g ?200 mL/hr over 30 Minutes Intravenous  Once 12/17/21 2132 12/18/21 0852  ? 12/17/21 2115  metroNIDAZOLE (FLAGYL) IVPB 500 mg       ? 500 mg ?100 mL/hr over 60 Minutes Intravenous  Once 12/17/21 2105 12/18/21 0019  ? 12/17/21 1330  cefTRIAXone (ROCEPHIN) 2 g in sodium chloride 0.9 % 100 mL IVPB       ? 2 g ?200 mL/hr over 30 Minutes Intravenous Every 24 hours 12/17/21 1326 12/24/21 1329  ? ?  ? ? ?Assessment/Plan ?Incarcerated right femoral hernia ?POD#2 s/p LAPAROSCOPIC RIGHT femoral HERNIA REPAIR WITH MESH, TEP (Right) Exploratory laparotomy, small bowel resection with anastomosis 3/28 Dr. Derrell Lolling ?- afebrile, VSS ?- continue NG to LIWS and await return of bowel function; ok for a few ice chips/sips  ?- mobilize with PT ? ?FEN: NPO, NGT to LIWS; hypokalemia resolved (3.6) - KCl ordered for goal K > 4.0 ?ID: Cefotan peri-op 3/28, Rocephin 3/28 >> for UTI ?Foley: in place, would continue today given acute renal failure ?Dispo: med-surg, await return of bowel function  ? ?Below per primary team-- ?AKI - BUN 71, Creatinine 5.56, UOP 1450 cc/24h. Management per medicine. ?E.coli UTI, diagnosed 3/24; repeat urine culture is pending  ? ? ?I reviewed nursing notes, hospitalist notes, last 24 h vitals and pain scores, last 48 h intake and output, last 24 h labs and trends, and last 24 h imaging results. ? ? ?Hosie Spangle, PA-C ?[facility] ?Please see Amion for pager number during day hours 7:00am-4:30pm ? ? ? ? ? ? ?

## 2021-12-19 NOTE — Progress Notes (Signed)
Mobility Specialist: Progress Note ? ? 12/19/21 1238  ?Mobility  ?Activity Ambulated independently in hallway  ?Level of Assistance Independent  ?Assistive Device None  ?Distance Ambulated (ft) 240 ft  ?Activity Response Tolerated well  ?$Mobility charge 1 Mobility  ? ?Received pt in bed having no complaints and agreeable to mobility. Asymptomatic throughout ambulation, returned back to bed w/ call bell in reach and all needs met. ? ?Christopher Day ?Mobility Specialist ?Mobility Specialist 5 North: [phone] ?Mobility Specialist 6 North: [phone] ? ?

## 2021-12-20 DIAGNOSIS — K4130 Unilateral femoral hernia, with obstruction, without gangrene, not specified as recurrent: Secondary | ICD-10-CM | POA: Diagnosis not present

## 2021-12-20 DIAGNOSIS — N179 Acute kidney failure, unspecified: Secondary | ICD-10-CM | POA: Diagnosis not present

## 2021-12-20 LAB — CBC
HCT: 39.3 % (ref 36.0–46.0)
Hemoglobin: 13.1 g/dL (ref 12.0–15.0)
MCH: 33.9 pg (ref 26.0–34.0)
MCHC: 33.3 g/dL (ref 30.0–36.0)
MCV: 101.6 fL — ABNORMAL HIGH (ref 80.0–100.0)
Platelets: 214 10*3/uL (ref 150–400)
RBC: 3.87 MIL/uL (ref 3.87–5.11)
RDW: 13.1 % (ref 11.5–15.5)
WBC: 9.2 10*3/uL (ref 4.0–10.5)
nRBC: 0 % (ref 0.0–0.2)

## 2021-12-20 LAB — BASIC METABOLIC PANEL
Anion gap: 22 — ABNORMAL HIGH (ref 5–15)
Anion gap: 16 — ABNORMAL HIGH (ref 5–15)
BUN: 71 mg/dL — ABNORMAL HIGH (ref 8–23)
BUN: 51 mg/dL — ABNORMAL HIGH (ref 8–23)
CO2: 28 mmol/L (ref 22–32)
CO2: 32 mmol/L (ref 22–32)
Calcium: 7.9 mg/dL — ABNORMAL LOW (ref 8.9–10.3)
Calcium: 8.4 mg/dL — ABNORMAL LOW (ref 8.9–10.3)
Chloride: 90 mmol/L — ABNORMAL LOW (ref 98–111)
Chloride: 96 mmol/L — ABNORMAL LOW (ref 98–111)
Creatinine, Ser: 4.03 mg/dL — ABNORMAL HIGH (ref 0.44–1.00)
Creatinine, Ser: 2.65 mg/dL — ABNORMAL HIGH (ref 0.44–1.00)
GFR, Estimated: 12 mL/min — ABNORMAL LOW (ref >60)
GFR, Estimated: 20 mL/min — ABNORMAL LOW (ref >60)
Glucose, Bld: 63 mg/dL — ABNORMAL LOW (ref 70–99)
Glucose, Bld: 134 mg/dL — ABNORMAL HIGH (ref 70–99)
Potassium: 2.8 mmol/L — ABNORMAL LOW (ref 3.5–5.1)
Potassium: 2.9 mmol/L — ABNORMAL LOW (ref 3.5–5.1)
Sodium: 140 mmol/L (ref 135–145)
Sodium: 144 mmol/L (ref 135–145)

## 2021-12-20 LAB — GLUCOSE, CAPILLARY
Glucose-Capillary: 57 mg/dL — ABNORMAL LOW (ref 70–99)
Glucose-Capillary: 109 mg/dL — ABNORMAL HIGH (ref 70–99)
Glucose-Capillary: 94 mg/dL (ref 70–99)

## 2021-12-20 LAB — MAGNESIUM: Magnesium: 2.1 mg/dL (ref 1.7–2.4)

## 2021-12-20 MED ORDER — POTASSIUM CHLORIDE 10 MEQ/100ML IV SOLN
10.0000 meq | INTRAVENOUS | Status: AC
  Administered 2021-12-20 (×6): 10 meq via INTRAVENOUS
  Filled 2021-12-20 (×4): qty 100

## 2021-12-20 MED ORDER — DEXTROSE IN LACTATED RINGERS 5 % IV SOLN
INTRAVENOUS | Status: DC
  Filled 2021-12-20 (×3): qty 1000

## 2021-12-20 MED ORDER — DEXTROSE 50 % IV SOLN
12.5000 g | INTRAVENOUS | Status: AC
  Administered 2021-12-20: 12.5 g via INTRAVENOUS
  Filled 2021-12-20: qty 50

## 2021-12-20 MED ORDER — KCL-LACTATED RINGERS-D5W 20 MEQ/L IV SOLN
INTRAVENOUS | Status: DC
  Filled 2021-12-20 (×3): qty 1000

## 2021-12-20 MED ORDER — POTASSIUM CHLORIDE 10 MEQ/100ML IV SOLN
10.0000 meq | INTRAVENOUS | Status: AC
  Administered 2021-12-20 (×4): 10 meq via INTRAVENOUS
  Filled 2021-12-20 (×4): qty 100

## 2021-12-20 NOTE — Progress Notes (Addendum)
MD notified of patients blood glucose of 57. Patient is asymptomatic she is awake talking about macaroni and cheese  Hypoglycemic order protocol initiated. Will recheck blood glucose as ordered.  ? ?Rechecked patients blood glucose it is now 94. ? ?Rechecked blood glucose at 0622 it is now 109 ? ?0631 MD would like day shift staff to recheck blood glucose to be sure it does not drop again. Will pass off to oncoming staff.  ?

## 2021-12-20 NOTE — Progress Notes (Addendum)
Pt asking for a cup of ice and another ginger ale at this time. She already has a can of ginger ale sitting at bedside. Educated pt on importance of adhering to MD order of ice chips and a few sips of ginger ale every 2 hours in order to get accurate output measurements. Pt verbalizes understanding but also asking if she can eat something.  ?NGT output has totaled in the last 45 minutes. NGT remains to low intermittent suction; canister emptied at this time ?

## 2021-12-20 NOTE — Progress Notes (Addendum)
Patient was educated on the importance of only eating ice chips. She has had two cans of ginger ale, but is allowing the ice to melt so she can water to drink as well. Patient has asked for ice chips frequently from multiple staff members.Will continue to educate. ?

## 2021-12-20 NOTE — Progress Notes (Signed)
?Progress Note ? ?Patient: Cynthia DoneJean E Edgington ZOX:[id number] DOB: 07/07/1956  ?DOA: 12/17/2021  DOS: 12/20/2021  ?  ?Brief hospital course: ?Cynthia Morrison is a 66 y.o. female with a history of HTN, depression/anxiety who presented to the ED with nausea and vomiting worsening for a few days with associated new slightly tender lump in the right groin that she couldn't push back in. She was found to have acute renal failure with granular casts and an incarcerated, strangulated right femoral hernia for which she was taken emergently for laparoscopic right femoral hernia repair with mesh and required resection of ischemic small bowel thru midline incision. Lactic acid improved 6.1 > 1.9, creatinine has stabilized with GFR ~6-287ml/min.  ? ?Assessment and Plan: ?Strangulated right femoral hernia s/p repair with mesh, small bowel resection 3/28 by Dr. Derrell Lollingamirez:  ?- Postoperative management per surgery.  ?- Continuing NGT per surgery. Output still high though having flatus. Enteral orders per surgery.  ? ?ARF due to ATN and prerenal azotemia: Hyaline and granular casts on micro. 6-10 RBCs, +ketones, protein. Renal U/S without findings suggestive of obstructive uropathy or chronic renal disease. Only prior Cr is from >5 years ago and was 0.79. Pt has hx HTN but no other RF's for CKD. On NSAID but not often and was taking ARB. Suspect this is all acute.  ?- ATN can often take a few days to turn around and it appears that it is. She's clearing solute more, though FENa of 2.3% still suggestive of ATN. Important not to add additional insults at this point. Continue monitoring daily. No indications for urgent HD, in fact bicarb is coming back up.  ?- Keep LR, add KCl given she is clearing K renally and still with losses, recheck this PM.  ?- Add D5 given her hypoglycemic episode.   ?- Avoid hypotension and nephrotoxins, namely contrast and NSAIDs. Holding home losartan. ?- DC foley if ok w/surgery. She is ambulating and this will help her  move more. I believe we can keep strict UOP without foley now. ? ?Hypokalemia: Related to poor po intake PTA but also significant GI losses ongoing.  ?- Supplement by IV runs and in MIVF. Monitor closely to not overcorrect.   ? ?Recently diagnosed with E. coli UTI (3/24):  ?- Continue ceftriaxone (also received flagyl for intraabdominal source). Got perioperative cefotetan as well.  ?- Monitor blood and urine cultures.  ? ?Subjective: +flatus, no BM. Perseverant on wanting to eat. Good UOP. Getting up and moving around. ? ?Objective: ?Vitals:  ? 12/19/21 1622 12/19/21 1945 12/20/21 0417 12/20/21 0747  ?BP: 140/84 (!) 148/71 (!) 153/84 (!) 151/81  ?Pulse: 77 80 72 62  ?Resp: 18 17 16 18   ?Temp: 98 ?F (36.7 ?C) 97.7 ?F (36.5 ?C) 97.7 ?F (36.5 ?C) 98.8 ?F (37.1 ?C)  ?TempSrc:  Oral Oral Oral  ?SpO2: 94% 94% 96% 97%  ?Weight:      ?Height:      ?Gen: 66 y.o. female in no distress ?HEENT:: NGT in place ?Pulm: Nonlabored breathing room air. Clear. ?CV: Regular rate and rhythm. No murmur, rub, or gallop. No JVD, no dependent edema. ?GI: Abdomen soft, non-tender, non-distended, with normoactive bowel sounds.  ?Ext: Warm, no deformities ?Skin: No rashes, lesions or ulcers on visualized skin. ?Neuro: Alert and oriented. No focal neurological deficits. ?Psych: Judgement and insight appear fair. Mood euthymic & affect congruent. Behavior is appropriate.   ? ?Data Personally reviewed: ? ?CBC: ?Recent Labs  ?Lab 12/17/21 ?1348 12/18/21 ?0129 12/20/21 ?0143  ?  WBC 10.5 6.7 9.2  ?NEUTROABS 7.8*  --   --   ?HGB 18.4* 13.3 13.1  ?HCT 53.5* 39.1 39.3  ?MCV 99.3 99.0 101.6*  ?PLT 279 193 214  ? ?Basic Metabolic Panel: ?Recent Labs  ?Lab 12/17/21 ?1540 12/18/21 ?0129 12/18/21 ?1929 12/19/21 ?0113 12/20/21 ?0143  ?NA 132* 134* 135 136 140  ?K 2.9* 2.8* 3.6 3.6 2.8*  ?CL 78* 85* 89* 92* 90*  ?CO2 32 31 26 22 28   ?GLUCOSE 107* 127* 85 72 63*  ?BUN 64* 71* 79* 79* 71*  ?CREATININE 5.36* 5.96* 5.86* 5.56* 4.03*  ?CALCIUM 7.9* 6.9* 7.0* 7.1*  7.9*  ?MG  --  1.7  --   --   --   ? ?GFR: ?Estimated Creatinine Clearance: 10.3 mL/min (A) (by C-G formula based on SCr of 4.03 mg/dL (H)). ?Liver Function Tests: ?Recent Labs  ?Lab 12/17/21 ?1540 12/18/21 ?0129  ?AST 38 29  ?ALT 39 31  ?ALKPHOS 60 54  ?BILITOT 0.4 0.5  ?PROT 5.9* 5.4*  ?ALBUMIN 2.8* 2.8*  ? ?Coagulation Profile: ?Recent Labs  ?Lab 12/17/21 ?1348  ?INR 1.1  ? ?CBG: ?Recent Labs  ?Lab 12/20/21 ?12/22/21 12/20/21 ?12/22/21 12/20/21 ?12/22/21  ?GLUCAP 57* 94 109*  ? ?Urine analysis: ?   ?Component Value Date/Time  ? COLORURINE YELLOW 12/19/2021 0739  ? APPEARANCEUR CLOUDY (A) 12/19/2021 0739  ? LABSPEC 1.020 12/19/2021 0739  ? PHURINE 6.0 12/19/2021 0739  ? GLUCOSEU 250 (A) 12/19/2021 0739  ? HGBUR MODERATE (A) 12/19/2021 0739  ? BILIRUBINUR NEGATIVE 12/19/2021 0739  ? KETONESUR >80 (A) 12/19/2021 0739  ? PROTEINUR 100 (A) 12/19/2021 0739  ? UROBILINOGEN 0.2 12/13/2021 1112  ? NITRITE NEGATIVE 12/19/2021 0739  ? LEUKOCYTESUR NEGATIVE 12/19/2021 0739  ? ?Recent Results (from the past 240 hour(s))  ?Urine Culture     Status: Abnormal  ? Collection Time: 12/13/21 11:17 AM  ? Specimen: Urine, Clean Catch  ?Result Value Ref Range Status  ? Specimen Description URINE, CLEAN CATCH  Final  ? Special Requests   Final  ?  NONE ?Performed at [facility], [address], [place], [place] ?  ? Culture >=100,000 COLONIES/mL ESCHERICHIA COLI (A)  Final  ? Report Status 12/16/2021 FINAL  Final  ? Organism ID, Bacteria ESCHERICHIA COLI (A)  Final  ?    Susceptibility  ? Escherichia coli - MIC*  ?  AMPICILLIN >=32 RESISTANT Resistant   ?  CEFAZOLIN <=4 SENSITIVE Sensitive   ?  CEFEPIME <=0.12 SENSITIVE Sensitive   ?  CEFTRIAXONE <=0.25 SENSITIVE Sensitive   ?  CIPROFLOXACIN <=0.25 SENSITIVE Sensitive   ?  GENTAMICIN <=1 SENSITIVE Sensitive   ?  IMIPENEM <=0.25 SENSITIVE Sensitive   ?  NITROFURANTOIN <=16 SENSITIVE Sensitive   ?  TRIMETH/SULFA <=20 SENSITIVE Sensitive   ?  AMPICILLIN/SULBACTAM >=32 RESISTANT  Resistant   ?  PIP/TAZO >=128 RESISTANT Resistant   ?  * >=100,000 COLONIES/mL ESCHERICHIA COLI  ?Urine Culture     Status: None  ? Collection Time: 12/17/21  1:24 PM  ? Specimen: Urine, Clean Catch  ?Result Value Ref Range Status  ? Specimen Description URINE, CLEAN CATCH  Final  ? Special Requests NONE  Final  ? Culture   Final  ?  NO GROWTH ?Performed at [facility], [address], [place], [place] ?  ? Report Status 12/19/2021 FINAL  Final  ?Resp Panel by RT-PCR (Flu A&B, Covid) Nasopharyngeal Swab     Status: None  ?  Collection Time: 12/17/21  1:25 PM  ? Specimen: Nasopharyngeal Swab; Nasopharyngeal(NP) swabs in vial transport medium  ?Result Value Ref Range Status  ? SARS Coronavirus 2 by RT PCR NEGATIVE NEGATIVE Final  ?  Comment: (NOTE) ?SARS-CoV-2 target nucleic acids are NOT DETECTED. ? ?The SARS-CoV-2 RNA is generally detectable in upper respiratory ?specimens during the acute phase of infection. The lowest ?concentration of SARS-CoV-2 viral copies this assay can detect is ?138 copies/mL. A negative result does not preclude SARS-Cov-2 ?infection and should not be used as the sole basis for treatment or ?other patient management decisions. A negative result may occur with  ?improper specimen collection/handling, submission of specimen other ?than nasopharyngeal swab, presence of viral mutation(s) within the ?areas targeted by this assay, and inadequate number of viral ?copies(<138 copies/mL). A negative result must be combined with ?clinical observations, patient history, and epidemiological ?information. The expected result is Negative. ? ?Fact Sheet for Patients:  ?BloggerCourse.com ? ?Fact Sheet for Healthcare Providers:  ?SeriousBroker.it ? ?This test is no t yet approved or cleared by the [place] FDA and  ?has been authorized for detection and/or diagnosis of SARS-CoV-2 by ?FDA under an Emergency Use Authorization (EUA). This EUA  will remain  ?in effect (meaning this test can be used) for the duration of the ?COVID-19 declaration under Section 564(b)(1) of the Act, 21 ?U.S.C.section 360bbb-3(b)(1), unless the authorization is termin

## 2021-12-20 NOTE — Progress Notes (Signed)
[facility] ?Progress Note ? ?3 Days Post-Op  ?Subjective: ?CC:  ?Pain controlled. Asking for ice and ginger ale. Foley just removed and pt sitting on bedside commode. Reports some flatus, no BM.  ? ?Over 3 L documented from NG tube. Pt reportedly drinking ginger ale and eating ice a lot. Has already had one full ginger ale this AM.  ? ?Objective: ?Vital signs in last 24 hours: ?Temp:  [97.7 ?F (36.5 ?C)-98.8 ?F (37.1 ?C)] 98.8 ?F (37.1 ?C) (03/31 0747) ?Pulse Rate:  [62-80] 62 (03/31 0747) ?Resp:  [16-18] 18 (03/31 0747) ?BP: (140-153)/(71-84) 151/81 (03/31 0747) ?SpO2:  [94 %-97 %] 97 % (03/31 0747) ?Last BM Date : 12/13/21 ? ?Intake/Output from previous day: ?03/30 0701 - 03/31 0700 ?In: 2710 [P.O.:760; I.V.:1350; NG/GT:500; IV Piggyback:100] ?Out: 4600 [Urine:1550; Emesis/NG output:3050] ?Intake/Output this shift: ?Total I/O ?In: 200 [P.O.:200] ?Out: 750 [Urine:650; Emesis/NG output:100] ? ?PE: ?Gen:  Alert, NAD ?Pulm:  Normal effort ORA ?Abd: Soft, appropriately tender, lower midline incision with honeycomb in place, c/d/I ? NGT in place with ~300 cc green bilious fluid in cannister ?GU: foley removed. ?Skin: warm and dry, no rashes  ?Psych: A&Ox3  ? ?Lab Results:  ?Recent Labs  ?  12/18/21 ?0129 12/20/21 ?0143  ?WBC 6.7 9.2  ?HGB 13.3 13.1  ?HCT 39.1 39.3  ?PLT 193 214  ? ?BMET ?Recent Labs  ?  12/19/21 ?0113 12/20/21 ?0143  ?NA 136 140  ?K 3.6 2.8*  ?CL 92* 90*  ?CO2 22 28  ?GLUCOSE 72 63*  ?BUN 79* 71*  ?CREATININE 5.56* 4.03*  ?CALCIUM 7.1* 7.9*  ? ?PT/INR ?Recent Labs  ?  12/17/21 ?1348  ?LABPROT 13.7  ?INR 1.1  ? ?CMP  ?   ?Component Value Date/Time  ? NA 140 12/20/2021 0143  ? K 2.8 (L) 12/20/2021 0143  ? CL 90 (L) 12/20/2021 0143  ? CO2 28 12/20/2021 0143  ? GLUCOSE 63 (L) 12/20/2021 0143  ? BUN 71 (H) 12/20/2021 0143  ? CREATININE 4.03 (H) 12/20/2021 0143  ? CALCIUM 7.9 (L) 12/20/2021 0143  ? PROT 5.4 (L) 12/18/2021 0129  ? ALBUMIN 2.8 (L) 12/18/2021 0129  ? AST 29 12/18/2021 0129  ? ALT  31 12/18/2021 0129  ? ALKPHOS 54 12/18/2021 0129  ? BILITOT 0.5 12/18/2021 0129  ? GFRNONAA 12 (L) 12/20/2021 0143  ? GFRAA >60 10/24/2015 1308  ? ?Lipase  ?No results found for: LIPASE ? ? ? ? ?Studies/Results: ?No results found. ? ?Anti-infectives: ?Anti-infectives (From admission, onward)  ? ? Start     Dose/Rate Route Frequency Ordered Stop  ? 12/18/21 0800  cefoTEtan (CEFOTAN) 2 g in sodium chloride 0.9 % 100 mL IVPB       ? 2 g ?200 mL/hr over 30 Minutes Intravenous  Once 12/17/21 2132 12/18/21 0852  ? 12/17/21 2115  metroNIDAZOLE (FLAGYL) IVPB 500 mg       ? 500 mg ?100 mL/hr over 60 Minutes Intravenous  Once 12/17/21 2105 12/18/21 0019  ? 12/17/21 1330  cefTRIAXone (ROCEPHIN) 2 g in sodium chloride 0.9 % 100 mL IVPB       ? 2 g ?200 mL/hr over 30 Minutes Intravenous Every 24 hours 12/17/21 1326 12/24/21 1329  ? ?  ? ? ?Assessment/Plan ?Incarcerated right femoral hernia ?POD#3 s/p LAPAROSCOPIC RIGHT femoral HERNIA REPAIR WITH MESH, TEP (Right) Exploratory laparotomy, small bowel resection with anastomosis 3/28 Dr. Rosendo Gros ?- afebrile, VSS, WBC 8 ?- having flatus, await further bowel function, NG still high output ?-  continue NG to LIWS and await return of bowel function; ok for a few ice chips; stop ginger ale ?- mobilize ? ?FEN: NPO, NGT to LIWS; hypokalemia 2.8 ?ID: Cefotan peri-op 3/28, Rocephin 3/28 >> for UTI ?Foley: in place, would continue today given acute renal failure ?Dispo: med-surg, await return of bowel function  ? ?Below per primary team-- ?AKI - BUN 71, Creatinine 4.03, mgmt per medicine, foley removed today ?E.coli UTI, diagnosed 3/24, repeat urine Cx 3/28 NGTD. ? ? ?I reviewed nursing notes, hospitalist notes, last 24 h vitals and pain scores, last 48 h intake and output, last 24 h labs and trends, and last 24 h imaging results. ? ? ?Obie Dredge, PA-C ?[facility] ?Please see Amion for pager number during day hours 7:00am-4:30pm ? ? ? ? ? ? ?

## 2021-12-20 NOTE — Progress Notes (Signed)
Report given to Tiffanie,RN. NGT was checked by both Rns, all connections and tubing working correctly.   ?

## 2021-12-21 DIAGNOSIS — N179 Acute kidney failure, unspecified: Secondary | ICD-10-CM | POA: Diagnosis not present

## 2021-12-21 DIAGNOSIS — K4130 Unilateral femoral hernia, with obstruction, without gangrene, not specified as recurrent: Secondary | ICD-10-CM | POA: Diagnosis not present

## 2021-12-21 LAB — BASIC METABOLIC PANEL
Anion gap: 13 (ref 5–15)
Anion gap: 13 (ref 5–15)
BUN: 39 mg/dL — ABNORMAL HIGH (ref 8–23)
BUN: 27 mg/dL — ABNORMAL HIGH (ref 8–23)
CO2: 35 mmol/L — ABNORMAL HIGH (ref 22–32)
CO2: 32 mmol/L (ref 22–32)
Calcium: 8.7 mg/dL — ABNORMAL LOW (ref 8.9–10.3)
Calcium: 8.1 mg/dL — ABNORMAL LOW (ref 8.9–10.3)
Chloride: 99 mmol/L (ref 98–111)
Chloride: 97 mmol/L — ABNORMAL LOW (ref 98–111)
Creatinine, Ser: 2.07 mg/dL — ABNORMAL HIGH (ref 0.44–1.00)
Creatinine, Ser: 1.44 mg/dL — ABNORMAL HIGH (ref 0.44–1.00)
GFR, Estimated: 26 mL/min — ABNORMAL LOW (ref >60)
GFR, Estimated: 41 mL/min — ABNORMAL LOW (ref >60)
Glucose, Bld: 108 mg/dL — ABNORMAL HIGH (ref 70–99)
Glucose, Bld: 145 mg/dL — ABNORMAL HIGH (ref 70–99)
Potassium: 3.2 mmol/L — ABNORMAL LOW (ref 3.5–5.1)
Potassium: 3.2 mmol/L — ABNORMAL LOW (ref 3.5–5.1)
Sodium: 147 mmol/L — ABNORMAL HIGH (ref 135–145)
Sodium: 142 mmol/L (ref 135–145)

## 2021-12-21 LAB — MAGNESIUM: Magnesium: 2.1 mg/dL (ref 1.7–2.4)

## 2021-12-21 MED ORDER — POTASSIUM CHLORIDE 2 MEQ/ML IV SOLN
INTRAVENOUS | Status: DC
  Filled 2021-12-21 (×2): qty 1000

## 2021-12-21 MED ORDER — KCL IN DEXTROSE-NACL 40-5-0.45 MEQ/L-%-% IV SOLN
INTRAVENOUS | Status: DC
Start: 2021-12-21 — End: 2021-12-22
  Filled 2021-12-21 (×5): qty 1000

## 2021-12-21 MED ORDER — POTASSIUM CHLORIDE 10 MEQ/100ML IV SOLN
10.0000 meq | INTRAVENOUS | Status: DC
  Administered 2021-12-21: 10 meq via INTRAVENOUS
  Filled 2021-12-21 (×2): qty 100

## 2021-12-21 MED ORDER — KCL IN DEXTROSE-NACL 40-5-0.45 MEQ/L-%-% IV SOLN
INTRAVENOUS | Status: DC
  Filled 2021-12-21: qty 1000

## 2021-12-21 NOTE — Progress Notes (Signed)
Mobility Specialist Progress Note: ? ? 12/21/21 1200  ?Mobility  ?Activity Ambulated with assistance to bathroom;Ambulated with assistance in room  ?Level of Assistance Independent after set-up  ?Assistive Device None  ?Distance Ambulated (ft) 40 ft  ?Activity Response Tolerated well  ?$Mobility charge 1 Mobility  ? ?Pt received EOB needing to use restroom. Pt able to have small BM. Left in bed with call bell in reach and all needs met.  ? ?Cynthia Morrison ?Mobility Specialist ?Primary Phone [phone] ? ?

## 2021-12-21 NOTE — Progress Notes (Signed)
?Progress Note ? ?Patient: Cynthia Morrison EUM:[id number] DOB: 03-29-1956  ?DOA: 12/17/2021  DOS: 12/21/2021  ?  ?Brief hospital course: ?Cynthia Morrison is a 66 y.o. female with a history of HTN, depression/anxiety who presented to the ED with nausea and vomiting worsening for a few days with associated new slightly tender lump in the right groin that she couldn't push back in. She was found to have acute renal failure with granular casts and an incarcerated, strangulated right femoral hernia for which she was taken emergently for laparoscopic right femoral hernia repair with mesh and required resection of ischemic small bowel thru midline incision. Lactic acid improved 6.1 > 1.9, creatinine has improved. ? ?Assessment and Plan: ?Strangulated right femoral hernia s/p repair with mesh, small bowel resection 3/28 by Dr. Derrell Lolling:  ?- Postoperative management per surgery. DC NGT 4/1, started fluids. Output over past 24 hours puts net ~500cc but this includes gingerale and ice chips that were suctioned back.  ?- Continuing NGT per surgery. Output still high though having flatus. Enteral orders per surgery.  ? ?ARF due to ATN and prerenal azotemia: Hyaline and granular casts on micro. 6-10 RBCs, +ketones, protein. Renal U/S without findings suggestive of obstructive uropathy or chronic renal disease. Only prior Cr is from >5 years ago and was 0.79. Pt has hx HTN but no other RF's for CKD. On NSAID but not often and was taking ARB. Suspect this is all acute.  ?- Improvement continues, will deescalate IVF rate but continue to monitor closely. ?- Avoid hypotension and nephrotoxins, namely contrast and NSAIDs. Holding home losartan. ? ?Hypernatremia:  ?- Change to hypotonic IVF, recheck this PM.  ? ?Hypokalemia: Related to poor po intake PTA but also significant GI losses ongoing.  ?- Supplement in IVF. Can recheck in PM without additional runs of K or K dur at this time since she'll likely take better po and have less GI losses.    ? ?Recently diagnosed with E. coli UTI (3/24):  ?- Continue ceftriaxone (also received flagyl for intraabdominal source). Got perioperative cefotetan as well.  ?- Monitor blood and urine cultures.  ? ?Subjective: +flatus, no abd pain, is allowed to take fluids and NGT removed this morning. She is very excited about this. K thru IV burns her arm. ? ?Objective: ?Vitals:  ? 12/20/21 1616 12/20/21 2014 12/21/21 0439 12/21/21 0816  ?BP: (!) 170/81 (!) 152/79 (!) 179/92 (!) 162/99  ?Pulse: 76 76 84 (!) 102  ?Resp: 18 16 17 18   ?Temp: 98 ?F (36.7 ?C) (!) 97.3 ?F (36.3 ?C) 98.2 ?F (36.8 ?C) 98.1 ?F (36.7 ?C)  ?TempSrc: Oral Oral Oral   ?SpO2: 93% 91% 92% 93%  ?Weight:      ?Height:      ?Gen: 66 y.o. female in no distress ?HEENT: No NGT ?Pulm: Nonlabored breathing room air. Clear. ?CV: Regular rate and rhythm. No murmur, rub, or gallop. No JVD, no dependent edema. ?GI: Abdomen soft, appropriately tender mostly around incision which remains c/d/I. Elsewhere nontender, non-distended, with normoactive bowel sounds.  ?Ext: Warm, no deformities ?Skin: No new rashes, lesions or ulcers on visualized skin. ?Neuro: Alert and oriented. No focal neurological deficits. ?Psych: Judgement and insight appear fair. Mood euthymic & affect congruent. Behavior is appropriate.   ? ?Data Personally reviewed: ?WBC 9.2, hgb 13.1, plt 214 ?Cr 5.9 > 5.8 > 5.5 > 4.0 > 2.6 > 2.0\K 2.8 > 2.9 > 3.2 ?LFTs wnl ?Urine culture E. coli ?Blood cultures NGTD ?SARS-CoV-2 negative ? ?Family  Communication: None at bedside ? ?Disposition: ?Status is: Inpatient ?Remains inpatient appropriate because: ARF, ileus ?Planned Discharge Destination: Home ? ?Tyrone Nine, MD ?12/21/2021 10:56 AM ?Page by [email or web address]  ?

## 2021-12-21 NOTE — Progress Notes (Signed)
Mobility Specialist Progress Note: ? ? 12/21/21 1415  ?Mobility  ?Activity Ambulated with assistance to bathroom;Ambulated with assistance in room  ?Level of Assistance Independent after set-up  ?Assistive Device None  ?Distance Ambulated (ft) 40 ft  ?Activity Response Tolerated fair  ?$Mobility charge 1 Mobility  ? ?Pt received calling out for help. Ambulated to BR and was able to have a BM. Left in bed with call bell in reach and all needs met.  ? ?Cynthia Morrison ?Mobility Specialist ?Primary Phone [phone] ? ?

## 2021-12-21 NOTE — Progress Notes (Signed)
4 Days Post-Op  ? ?Subjective/Chief Complaint: ?Pt doing well this AM ?Passing some flatus ? ? ?Objective: ?Vital signs in last 24 hours: ?Temp:  [97.3 ?F (36.3 ?C)-98.2 ?F (36.8 ?C)] 98.1 ?F (36.7 ?C) (04/01 [id number]) ?Pulse Rate:  [76-102] 102 (04/01 0816) ?Resp:  [16-18] 18 (04/01 0816) ?BP: (152-179)/(79-99) 162/99 (04/01 0816) ?SpO2:  [91 %-93 %] 93 % (04/01 0816) ?Last BM Date : 12/13/21 ? ?Intake/Output from previous day: ?03/31 0701 - 04/01 0700 ?In: 2671.3 [P.O.:950; I.V.:785; IV Piggyback:936.3] ?Out: 3950 [id number]; Emesis/NG [id number] ?Intake/Output this shift: ?No intake/output data recorded. ?PE: ? ?Constitutional: No acute distress, conversant, appears states age. ?Eyes: Anicteric sclerae, moist conjunctiva, no lid lag ?Lungs: Clear to auscultation bilaterally, normal respiratory effort ?CV: regular rate and rhythm, no murmurs, no peripheral edema, pedal pulses 2+ ?GI: Soft, no masses or hepatosplenomegaly, non-tender to palpation, in c/d/I ?Skin: No rashes, palpation reveals normal turgor ?Psychiatric: appropriate judgment and insight, oriented to person, place, and time ? ? ? ?Lab Results:  ?Recent Labs  ?  12/20/21 ?0143  ?WBC 9.2  ?HGB 13.1  ?HCT 39.3  ?PLT 214  ? ?BMET ?Recent Labs  ?  12/20/21 ?1541 12/21/21 ?0132  ?NA 144 147*  ?K 2.9* 3.2*  ?CL 96* 99  ?CO2 32 35*  ?GLUCOSE 134* 108*  ?BUN 51* 39*  ?CREATININE 2.65* 2.07*  ?CALCIUM 8.4* 8.7*  ? ?Anti-infectives: ?Anti-infectives (From admission, onward)  ? ? Start     Dose/Rate Route Frequency Ordered Stop  ? 12/18/21 0800  cefoTEtan (CEFOTAN) 2 g in sodium chloride 0.9 % 100 mL IVPB       ? 2 g ?200 mL/hr over 30 Minutes Intravenous  Once 12/17/21 2132 12/18/21 0852  ? 12/17/21 2115  metroNIDAZOLE (FLAGYL) IVPB 500 mg       ? 500 mg ?100 mL/hr over 60 Minutes Intravenous  Once 12/17/21 2105 12/18/21 0019  ? 12/17/21 1330  cefTRIAXone (ROCEPHIN) 2 g in sodium chloride 0.9 % 100 mL IVPB       ? 2 g ?200 mL/hr over 30 Minutes Intravenous Every  24 hours 12/17/21 1326 12/24/21 1329  ? ?  ? ? ?Assessment/Plan: ?Incarcerated right femoral hernia ?POD#4 s/p LAPAROSCOPIC RIGHT femoral HERNIA REPAIR WITH MESH, TEP (Right) Exploratory laparotomy, small bowel resection with anastomosis 3/28 Dr. Rosendo Gros ?- afebrile, VSS, WBC 8 ?- having flatus, await further bowel function, NG still high output ?- DC NGT and start fulls ?- mobilize ?  ?FEN: FLD, DC NGT to LIWS; hypokalemia 2.8 ?ID: Cefotan peri-op 3/28, Rocephin 3/28 >> for UTI ?Foley: in place, would continue today given acute renal failure ?Dispo: med-surg, await return of bowel function  ?  ?Below per primary team-- ?AKI - BUN 71, Creatinine 4.03, mgmt per medicine, foley removed today ?E.coli UTI, diagnosed 3/24, repeat urine Cx 3/28 NGTD. ?  ?  ?I reviewed nursing notes, hospitalist notes, last 24 h vitals and pain scores, last 48 h intake and output, last 24 h labs and trends, and last 24 h imaging results. ? LOS: 4 days  ? ? ?Cynthia Morrison ?12/21/2021 ? ?

## 2021-12-22 DIAGNOSIS — N179 Acute kidney failure, unspecified: Secondary | ICD-10-CM | POA: Diagnosis not present

## 2021-12-22 DIAGNOSIS — K4130 Unilateral femoral hernia, with obstruction, without gangrene, not specified as recurrent: Secondary | ICD-10-CM | POA: Diagnosis not present

## 2021-12-22 LAB — BASIC METABOLIC PANEL
Anion gap: 8 (ref 5–15)
BUN: 19 mg/dL (ref 8–23)
CO2: 35 mmol/L — ABNORMAL HIGH (ref 22–32)
Calcium: 7.8 mg/dL — ABNORMAL LOW (ref 8.9–10.3)
Chloride: 97 mmol/L — ABNORMAL LOW (ref 98–111)
Creatinine, Ser: 1.46 mg/dL — ABNORMAL HIGH (ref 0.44–1.00)
GFR, Estimated: 40 mL/min — ABNORMAL LOW (ref >60)
Glucose, Bld: 166 mg/dL — ABNORMAL HIGH (ref 70–99)
Potassium: 4 mmol/L (ref 3.5–5.1)
Sodium: 140 mmol/L (ref 135–145)

## 2021-12-22 LAB — CBC
HCT: 44.9 % (ref 36.0–46.0)
Hemoglobin: 15.2 g/dL — ABNORMAL HIGH (ref 12.0–15.0)
MCH: 34.1 pg — ABNORMAL HIGH (ref 26.0–34.0)
MCHC: 33.9 g/dL (ref 30.0–36.0)
MCV: 100.7 fL — ABNORMAL HIGH (ref 80.0–100.0)
Platelets: 209 10*3/uL (ref 150–400)
RBC: 4.46 MIL/uL (ref 3.87–5.11)
RDW: 13.2 % (ref 11.5–15.5)
WBC: 18.3 10*3/uL — ABNORMAL HIGH (ref 4.0–10.5)
nRBC: 0 % (ref 0.0–0.2)

## 2021-12-22 LAB — CULTURE, BLOOD (ROUTINE X 2)
Culture: NO GROWTH
Culture: NO GROWTH

## 2021-12-22 MED ORDER — ACETAMINOPHEN 325 MG PO TABS
650.0000 mg | ORAL_TABLET | Freq: Four times a day (QID) | ORAL | Status: DC | PRN
  Administered 2021-12-22 – 2021-12-26 (×4): 650 mg via ORAL
  Filled 2021-12-22 (×6): qty 2

## 2021-12-22 MED ORDER — POTASSIUM CHLORIDE IN NACL 20-0.45 MEQ/L-% IV SOLN
INTRAVENOUS | Status: DC
  Filled 2021-12-22 (×3): qty 1000

## 2021-12-22 MED ORDER — SODIUM CHLORIDE 0.9 % IV BOLUS
500.0000 mL | Freq: Once | INTRAVENOUS | Status: AC
Start: 2021-12-23 — End: 2021-12-22
  Administered 2021-12-22: 500 mL via INTRAVENOUS

## 2021-12-22 NOTE — Progress Notes (Addendum)
?   12/22/21 0444 12/22/21 0455  ?Assess: MEWS Score  ?Temp 100.1 ?F (37.8 ?C) 98.2 ?F (36.8 ?C)  ?BP (!) 142/101 (!) 152/101  ?Pulse Rate (!) 124 (!) 122  ?Resp 18 18  ?SpO2  --  97 %  ?O2 Device  --  Room Air  ?Assess: MEWS Score  ?MEWS Temp 0 0  ?MEWS Systolic 0 0  ?MEWS Pulse 2 2  ?MEWS RR 0 0  ?MEWS LOC 0 0  ?MEWS Score 2 2  ?MEWS Score Color Yellow Yellow  ?Treat  ?Pain Scale  --  0-10  ?Take Vital Signs  ?Increase Vital Sign Frequency   --  Yellow: Q 2hr X 2 then Q 4hr X 2, if remains yellow, continue Q 4hrs  ?Escalate  ?MEWS: Escalate  --  Yellow: discuss with charge nurse/RN and consider discussing with provider and RRT  ?Notify: Charge Nurse/RN  ?Name of Charge Nurse/RN Notified  --  Albin Felling, RN  ?Date Charge Nurse/RN Notified  --  12/22/21  ?Time Charge Nurse/RN Notified  --  [phone]  ?Document  ?Patient Outcome  --  Other (Comment) ?(Will continue to monitor-pt remains stable)  ? ?Pt denies pain or anxiety, resting quietly in bed with eyes closed. Upon recheck, pt noted to be afebrile. Will continue to monitor, no distress noted.  ?

## 2021-12-22 NOTE — Progress Notes (Signed)
?Progress Note ? ?Patient: Cynthia Morrison:[id number] DOB: 1955/10/05  ?DOA: 12/17/2021  DOS: 12/22/2021  ?  ?Brief hospital course: ?LATORIA DRY is a 66 y.o. female with a history of HTN, depression/anxiety who presented to the ED with nausea and vomiting worsening for a few days with associated new slightly tender lump in the right groin that she couldn't push back in. She was found to have acute renal failure with granular casts and an incarcerated, strangulated right femoral hernia for which she was taken emergently for laparoscopic right femoral hernia repair with mesh and required resection of ischemic small bowel thru midline incision. Lactic acid improved 6.1 > 1.9, creatinine has improved. ? ?Assessment and Plan: ?Strangulated right femoral hernia s/p repair with mesh, small bowel resection 3/28 by Dr. Derrell Lolling:  ?- Abd is stable/benign at this time. NGT out and has had return of bowel function. Pain controlled without medications.  ? ?ARF due to ATN and prerenal azotemia: Hyaline and granular casts on micro. 6-10 RBCs, +ketones, protein. Renal U/S without findings suggestive of obstructive uropathy or chronic renal disease. Only prior Cr is from >5 years ago and was 0.79. Pt has hx HTN but no other RF's for CKD. On NSAID but not often and was taking ARB. Suspect this is all acute.  ?- Improvement continues, likely found baseline of Cr at ~1.4. Deescalate IVF further.   ?- Avoid hypotension and nephrotoxins, namely contrast and NSAIDs. Holding home losartan. ? ?Hypernatremia: Resolved.   ? ?Hypokalemia: Related to poor po intake PTA but also significant GI losses ongoing.  ?- Supplement at lower rate and concentration in IVF.  ? ?Recently diagnosed with E. coli UTI (3/24):  ?- Continue ceftriaxone (also received flagyl for intraabdominal source). Got perioperative cefotetan as well.  ?- Monitor blood and urine cultures.  ? ?Leukocytosis: Unclear etiology, though has risen in tandem with HR this morning. Hgb  also noted to have risen without transfusion so ?hemoconcentration. Plt's not up, but stable. Pt is on culture-directed high-dose antibiotics for UTI, has no symptoms or signs of cutaneous, pulmonary, on CNS infections. No symptoms of DVT/PE and has been on uninterrupted pharmacologic (lovenox) VTE ppx.  ?- ?if delayed rise postoperatively that is reactive.  ?- Abdomen is tender but stable from prior, defer to surgery whether repeat imaging or further work up is necessary in this regard.  ?- Will monitor for fever. If febrile, check cultures, broaden coverage.  ? ?Sinus tachycardia: Developing this AM. No evidence of acute blood loss anemia, appears euvolemic, not withdrawing from beta blocker, etc. No other evidence of substance withdrawal/intoxication. Not pain related as she's not gotten pain meds for days. She denies dyspnea, chest pain, and is 100% on room air.  ?- It is very odd that this is an acute change, but patient insists she feels well, wants to go home today. BP is normotensive, so we will just monitor for now. Continuing IVF as above. ? ?Subjective: Having BMs, ambulating, tolerating diet, wants to go home. She reports she still has symptoms of UTI, namely dysuria since removing catheter. Abd pain is tolerable and unchanged/not worse. No cough, shortness of breath. No leg swelling or chest pain. Does not feel ill. ? ?Objective: ?Vitals:  ? 12/22/21 0453 12/22/21 0455 12/22/21 8119 12/22/21 0809  ?BP: (!) 154/102 (!) 152/101 (!) 152/98 135/85  ?Pulse: (!) 125 (!) 122 (!) 122 (!) 120  ?Resp: 17 18 18 18   ?Temp: 98.2 ?F (36.8 ?C) 98.2 ?F (36.8 ?C) 100.1 ?  F (37.8 ?C) 97.7 ?F (36.5 ?C)  ?TempSrc:  Oral Oral   ?SpO2: 93% 97% 93% 93%  ?Weight:      ?Height:      ?Gen: 66 y.o. female in no distress ?Pulm: Nonlabored breathing room air. Clear. ?CV: Regular tachycardia without murmur, rub, or gallop. No JVD, no dependent edema. ?GI: Abdomen soft, appropriately tender to deep palpation, non-distended, with  normoactive bowel sounds.  ?Ext: Warm, no deformities ?Skin: Incision appears without significant erythema, fluctuance, exudate. No alternative source of infection on visualized skin.  ?Neuro: Alert and oriented. No meningismus or focal neurological deficits. ?Psych: Judgement and insight appear fair. Mood euthymic & affect congruent. Behavior is appropriate.   ? ?Data Personally reviewed: ?WBC 9.2 > 18.3k, hgb 13.1, plt 214 ?Cr 5.9 > 5.8 > 5.5 > 4.0 > 2.6 > 2.0 > 1.4 > 1.4 ?K 2.8 > 2.9 > 3.2 ?LFTs wnl ?Urine culture E. coli ?Blood cultures NGTD ?SARS-CoV-2 negative ? ?Family Communication: None at bedside ? ?Disposition: ?Status is: Inpatient ?Remains inpatient appropriate because: ARF, ileus ?Planned Discharge Destination: Home ? ?Tyrone Nine, MD ?12/22/2021 12:02 PM ?Page by [email or web address]  ?

## 2021-12-22 NOTE — Progress Notes (Signed)
5 Days Post-Op  ? ?Subjective/Chief Complaint: ?Patient tolerated clear liquid diet.  Patient had bowel movement.  Patient otherwise ambulating well. ? ? ?Objective: ?Vital signs in last 24 hours: ?Temp:  [97.7 ?F (36.5 ?C)-100.1 ?F (37.8 ?C)] 97.7 ?F (36.5 ?C) (04/02 0809) ?Pulse Rate:  [100-125] 120 (04/02 0809) ?Resp:  [17-18] 18 (04/02 0809) ?BP: (135-163)/(85-102) 135/85 (04/02 0809) ?SpO2:  [92 %-97 %] 93 % (04/02 0809) ?Last BM Date : 12/22/21 ? ?Intake/Output from previous day: ?04/01 0701 - 04/02 0700 ?In: 1219.3 [P.O.:480; I.V.:639.3; IV Piggyback:100] ?Out: 900 [Urine:900] ?Intake/Output this shift: ?Total I/O ?In: 240 [P.O.:240] ?Out: -  ? ?General appearance: alert and cooperative ?Incision/Wound: Incision is clean dry and intact. ? ?Lab Results:  ?Recent Labs  ?  12/20/21 ?0143 12/22/21 ?0801  ?WBC 9.2 18.3*  ?HGB 13.1 15.2*  ?HCT 39.3 44.9  ?PLT 214 209  ? ?BMET ?Recent Labs  ?  12/21/21 ?1521 12/22/21 ?0117  ?NA 142 140  ?K 3.2* 4.0  ?CL 97* 97*  ?CO2 32 35*  ?GLUCOSE 145* 166*  ?BUN 27* 19  ?CREATININE 1.44* 1.46*  ?CALCIUM 8.1* 7.8*  ? ?PT/INR ?No results for input(s): LABPROT, INR in the last 72 hours. ?ABG ?No results for input(s): PHART, HCO3 in the last 72 hours. ? ?Invalid input(s): PCO2, PO2 ? ?Studies/Results: ?No results found. ? ?Anti-infectives: ?Anti-infectives (From admission, onward)  ? ? Start     Dose/Rate Route Frequency Ordered Stop  ? 12/18/21 0800  cefoTEtan (CEFOTAN) 2 g in sodium chloride 0.9 % 100 mL IVPB       ? 2 g ?200 mL/hr over 30 Minutes Intravenous  Once 12/17/21 2132 12/18/21 0852  ? 12/17/21 2115  metroNIDAZOLE (FLAGYL) IVPB 500 mg       ? 500 mg ?100 mL/hr over 60 Minutes Intravenous  Once 12/17/21 2105 12/18/21 0019  ? 12/17/21 1330  cefTRIAXone (ROCEPHIN) 2 g in sodium chloride 0.9 % 100 mL IVPB       ? 2 g ?200 mL/hr over 30 Minutes Intravenous Every 24 hours 12/17/21 1326 12/24/21 1329  ? ?  ? ? ?Assessment/Plan: ?s/p Procedure(s): ?LAPAROSCOPIC RIGHT FEMORAL  HERNIA REPAIR WITH MESH (Right) ?EXPLORATORY LAPAROTOMY SMALL BOWEL RESECTION WITH ANASTOMOSIS (Right) ?Advance diet to soft. ?Continue with mobilization. ?Patient did have an elevation of her white count.  I spoke with Dr. Jarvis Newcomer about the findings.  Will likely keep her today repeat tomorrow morning and hopefully if normalized can go home. ? LOS: 5 days  ? ? ?Axel Filler ?12/22/2021 ? ?

## 2021-12-22 NOTE — Progress Notes (Signed)
Mobility Specialist Progress Note: ? ? 12/22/21 1223  ?Mobility  ?Activity Ambulated with assistance to bathroom;Ambulated with assistance in room  ?Level of Assistance Independent after set-up  ?Assistive Device None  ?Distance Ambulated (ft) 40 ft  ?Activity Response Tolerated well  ?$Mobility charge 1 Mobility  ? ?Pt received in bed asking to use restroom. No complaints of pain but pt stating " I just want to go home". Left in bed with call bell in reach and all needs met. ? ?Cynthia Morrison ?Mobility Specialist ?Primary Phone [phone] ? ?

## 2021-12-22 NOTE — Progress Notes (Deleted)
?   12/22/21 0444  ?Assess: MEWS Score  ?Temp 100.1 ?F (37.8 ?C)  ?BP (!) 142/101  ?Pulse Rate (!) 124  ?Resp 18  ?SpO2 96 %  ?O2 Device Room Air  ?Assess: MEWS Score  ?MEWS Temp 0  ?MEWS Systolic 0  ?MEWS Pulse 2  ?MEWS RR 0  ?MEWS LOC 0  ?MEWS Score 2  ?MEWS Score Color Yellow  ?Treat  ?Pain Scale 0-10  ?Pain Score 0  ?Take Vital Signs  ?Increase Vital Sign Frequency  Yellow: Q 2hr X 2 then Q 4hr X 2, if remains yellow, continue Q 4hrs  ?Escalate  ?MEWS: Escalate Yellow: discuss with charge nurse/RN and consider discussing with provider and RRT  ?Notify: Charge Nurse/RN  ?Name of Charge Nurse/RN Notified Carla,RN  ?Date Charge Nurse/RN Notified 12/22/21  ?Time Charge Nurse/RN Notified [phone]  ?Document  ?Patient Outcome Other (Comment) ?(Will continue to monitor, pt remains stable)  ? ? ?

## 2021-12-23 ENCOUNTER — Inpatient Hospital Stay (HOSPITAL_COMMUNITY): Payer: BC Managed Care – PPO

## 2021-12-23 DIAGNOSIS — K4130 Unilateral femoral hernia, with obstruction, without gangrene, not specified as recurrent: Secondary | ICD-10-CM | POA: Diagnosis not present

## 2021-12-23 DIAGNOSIS — N179 Acute kidney failure, unspecified: Secondary | ICD-10-CM | POA: Diagnosis not present

## 2021-12-23 LAB — CBC WITH DIFFERENTIAL/PLATELET
Abs Immature Granulocytes: 0.3 10*3/uL — ABNORMAL HIGH (ref 0.00–0.07)
Basophils Absolute: 0.1 10*3/uL (ref 0.0–0.1)
Basophils Relative: 0 %
Eosinophils Absolute: 0.3 10*3/uL (ref 0.0–0.5)
Eosinophils Relative: 2 %
HCT: 39.8 % (ref 36.0–46.0)
Hemoglobin: 13.7 g/dL (ref 12.0–15.0)
Immature Granulocytes: 2 %
Lymphocytes Relative: 7 %
Lymphs Abs: 1.4 10*3/uL (ref 0.7–4.0)
MCH: 34.4 pg — ABNORMAL HIGH (ref 26.0–34.0)
MCHC: 34.4 g/dL (ref 30.0–36.0)
MCV: 100 fL (ref 80.0–100.0)
Monocytes Absolute: 0.3 10*3/uL (ref 0.1–1.0)
Monocytes Relative: 2 %
Neutro Abs: 17.2 10*3/uL — ABNORMAL HIGH (ref 1.7–7.7)
Neutrophils Relative %: 87 %
Platelets: 156 10*3/uL (ref 150–400)
RBC: 3.98 MIL/uL (ref 3.87–5.11)
RDW: 13.3 % (ref 11.5–15.5)
WBC: 19.5 10*3/uL — ABNORMAL HIGH (ref 4.0–10.5)
nRBC: 0 % (ref 0.0–0.2)

## 2021-12-23 LAB — BASIC METABOLIC PANEL
Anion gap: 11 (ref 5–15)
BUN: 13 mg/dL (ref 8–23)
CO2: 23 mmol/L (ref 22–32)
Calcium: 7 mg/dL — ABNORMAL LOW (ref 8.9–10.3)
Chloride: 98 mmol/L (ref 98–111)
Creatinine, Ser: 1.1 mg/dL — ABNORMAL HIGH (ref 0.44–1.00)
GFR, Estimated: 56 mL/min — ABNORMAL LOW (ref >60)
Glucose, Bld: 91 mg/dL (ref 70–99)
Potassium: 3.1 mmol/L — ABNORMAL LOW (ref 3.5–5.1)
Sodium: 132 mmol/L — ABNORMAL LOW (ref 135–145)

## 2021-12-23 LAB — MRSA NEXT GEN BY PCR, NASAL: MRSA by PCR Next Gen: NOT DETECTED

## 2021-12-23 LAB — LACTIC ACID, PLASMA: Lactic Acid, Venous: 1.1 mmol/L (ref 0.5–1.9)

## 2021-12-23 MED ORDER — IOHEXOL 9 MG/ML PO SOLN
ORAL | Status: AC
  Administered 2021-12-23: 500 mL
  Filled 2021-12-23: qty 1000

## 2021-12-23 MED ORDER — SODIUM CHLORIDE 0.9 % IV SOLN
1.0000 g | Freq: Two times a day (BID) | INTRAVENOUS | Status: DC
  Administered 2021-12-23 – 2021-12-25 (×4): 1 g via INTRAVENOUS
  Filled 2021-12-23 (×4): qty 20

## 2021-12-23 MED ORDER — POTASSIUM CHLORIDE CRYS ER 20 MEQ PO TBCR
40.0000 meq | EXTENDED_RELEASE_TABLET | Freq: Once | ORAL | Status: AC
Start: 2021-12-23 — End: 2021-12-23
  Administered 2021-12-23: 40 meq via ORAL
  Filled 2021-12-23: qty 2

## 2021-12-23 MED ORDER — ENOXAPARIN SODIUM 40 MG/0.4ML IJ SOSY
40.0000 mg | PREFILLED_SYRINGE | INTRAMUSCULAR | Status: DC
  Administered 2021-12-23: 40 mg via SUBCUTANEOUS
  Filled 2021-12-23: qty 0.4

## 2021-12-23 MED ORDER — ENOXAPARIN SODIUM 40 MG/0.4ML IJ SOSY
40.0000 mg | PREFILLED_SYRINGE | INTRAMUSCULAR | Status: DC
Start: 2021-12-25 — End: 2021-12-26
  Administered 2021-12-25 – 2021-12-26 (×2): 40 mg via SUBCUTANEOUS
  Filled 2021-12-23 (×3): qty 0.4

## 2021-12-23 MED ORDER — IOHEXOL 300 MG/ML  SOLN
100.0000 mL | Freq: Once | INTRAMUSCULAR | Status: AC | PRN
  Administered 2021-12-23: 100 mL via INTRAVENOUS

## 2021-12-23 MED ORDER — LACTATED RINGERS IV SOLN: INTRAVENOUS | Status: DC

## 2021-12-23 NOTE — Progress Notes (Signed)
?   12/22/21 2143  ?Assess: MEWS Score  ?Temp 99.7 ?F (37.6 ?C)  ?BP (!) 150/89  ?Pulse Rate (!) 114  ?Resp 18  ?SpO2 94 %  ?O2 Device Room Air  ?Assess: MEWS Score  ?MEWS Temp 0  ?MEWS Systolic 0  ?MEWS Pulse 2  ?MEWS RR 0  ?MEWS LOC 0  ?MEWS Score 2  ?MEWS Score Color Yellow  ?Assess: if the MEWS score is Yellow or Red  ?Were vital signs taken at a resting state? Yes  ?Focused Assessment Change from prior assessment (see assessment flowsheet)  ?Early Detection of Sepsis Score *See Row Information* Low  ?MEWS guidelines implemented *See Row Information* Yes  ?Treat  ?Pain Scale 0-10  ?Pain Score 2  ?Pain Type Acute pain  ?Pain Location Head ?(c/o headache)  ?Pain Frequency Intermittent  ?Pain Intervention(s) MD notified (Comment) ?(for tylenol order, will administer after get orger)  ?Multiple Pain Sites No  ?Take Vital Signs  ?Increase Vital Sign Frequency  Yellow: Q 2hr X 2 then Q 4hr X 2, if remains yellow, continue Q 4hrs  ?Escalate  ?MEWS: Escalate Yellow: discuss with charge nurse/RN and consider discussing with provider and RRT  ?Notify: Charge Nurse/RN  ?Name of Charge Nurse/RN Notified Clara, RN  ?Date Charge Nurse/RN Notified 12/22/21  ?Notify: Provider  ?Provider Name/Title Reyes Ivan NP  ?Date Provider Notified 12/22/21  ?Notification Type Page  ?Notification Reason Change in status  ?Provider response See new orders  ? ? ?

## 2021-12-23 NOTE — Progress Notes (Addendum)
6 Days Post-Op  ? ?Subjective/Chief Complaint: ?Tolerating SOFT diet. Having flatus/stools. Ambulating. Wants to go home. ? ?HR WNL this AM, HR 120 yesterday.  ?WBCs continues to trend up (19.5 today), POD#6 ? ?Objective: ?Vital signs in last 24 hours: ?Temp:  [98.5 ?F (36.9 ?C)-99.8 ?F (37.7 ?C)] 98.5 ?F (36.9 ?C) (04/03 2694) ?Pulse Rate:  [85-114] 108 (04/03 0949) ?Resp:  [18] 18 (04/03 0949) ?BP: (126-159)/(76-99) 147/89 (04/03 0949) ?SpO2:  [93 %-97 %] 95 % (04/03 0949) ?Last BM Date : 12/22/21 ? ?Intake/Output from previous day: ?04/02 0701 - 04/03 0700 ?In: 2239.3 [P.O.:660; I.V.:1485.3; IV Piggyback:94.1] ?Out: 501 [Urine:501] ?Intake/Output this shift: ?Total I/O ?In: 211.1 [I.V.:211.1] ?Out: 1 [Stool:1] ? ?General appearance: alert and cooperative ?Incision/Wound: Incision is clean dry and intact. Mild abd distention, no peritonitis.  ? ?Lab Results:  ?Recent Labs  ?  12/22/21 ?0801 12/23/21 ?0314  ?WBC 18.3* 19.5*  ?HGB 15.2* 13.7  ?HCT 44.9 39.8  ?PLT 209 156  ? ?BMET ?Recent Labs  ?  12/22/21 ?0117 12/23/21 ?0314  ?NA 140 132*  ?K 4.0 3.1*  ?CL 97* 98  ?CO2 35* 23  ?GLUCOSE 166* 91  ?BUN 19 13  ?CREATININE 1.46* 1.10*  ?CALCIUM 7.8* 7.0*  ? ?PT/INR ?No results for input(s): LABPROT, INR in the last 72 hours. ?ABG ?No results for input(s): PHART, HCO3 in the last 72 hours. ? ?Invalid input(s): PCO2, PO2 ? ?Studies/Results: ?No results found. ? ?Anti-infectives: ?Anti-infectives (From admission, onward)  ? ? Start     Dose/Rate Route Frequency Ordered Stop  ? 12/18/21 0800  cefoTEtan (CEFOTAN) 2 g in sodium chloride 0.9 % 100 mL IVPB       ? 2 g ?200 mL/hr over 30 Minutes Intravenous  Once 12/17/21 2132 12/18/21 0852  ? 12/17/21 2115  metroNIDAZOLE (FLAGYL) IVPB 500 mg       ? 500 mg ?100 mL/hr over 60 Minutes Intravenous  Once 12/17/21 2105 12/18/21 0019  ? 12/17/21 1330  cefTRIAXone (ROCEPHIN) 2 g in sodium chloride 0.9 % 100 mL IVPB       ? 2 g ?200 mL/hr over 30 Minutes Intravenous Every 24 hours  12/17/21 1326 12/24/21 1329  ? ?  ? ? ?Assessment/Plan: ?s/p Procedure(s): ?LAPAROSCOPIC RIGHT FEMORAL HERNIA REPAIR WITH MESH (Right) ?EXPLORATORY LAPAROTOMY SMALL BOWEL RESECTION WITH ANASTOMOSIS (Right) ?Clinically improving post-op day#6- having bowel function, mobilizing, tolerating PO. Continue soft diet. ? ?WBC unfortunately continues to rise. Pt with some abd distention. ?Afebrile, UTI resolved (repeat Cx negative), no signs/sxs PNA, no tachypnea or new O2 requirement to suggest PE. Recommend Repeat CT A/P to investigate rising WBC and intermittent tachycardia. If CT abdomen and pelvis is negative for abscess or infectious process, I think discharge home today with outpatient follow up in our office is appropriate. Would also recommend follow up with PCP in 1 week for CBC/BMP.  ? ? ? LOS: 6 days  ? ? ?Cynthia Morrison ?12/23/2021 ? ?

## 2021-12-23 NOTE — Progress Notes (Signed)
Pharmacy Antibiotic Note ? ?Cynthia Morrison is a 67 y.o. female admitted on 12/17/2021 with pneumonia and abd infection .  Pharmacy has been consulted for Merrem dosing. ? ? ?Pt is s/p hernia surgery with small bowel resection. CT showed abscess. She has been on 7d of ceftriaxone. Wbc up today to 19.5. Likely for drainage tomorrow. Recent e.coli in urine showed an odd sensitivity pattern of being resistant to zosyn but sens to ceftriaxone. D/w Dr. Jarvis Newcomer and we will change Zosyn protocol to [facility] empirically for now until abscess is drained.  ? ?CrCl ~89ml/min ? ?Plan: ?Dc ceftriaxone ?Merrem 1g IV q12 ? ?Height: 5' (152.4 cm) ?Weight: 54.4 kg (120 lb) ?IBW/kg (Calculated) : 45.5 ? ?Temp (24hrs), Avg:99.1 ?F (37.3 ?C), Min:98.5 ?F (36.9 ?C), Max:99.8 ?F (37.7 ?C) ? ?Recent Labs  ?Lab 12/17/21 ?1325 12/17/21 ?1348 12/17/21 ?1540 12/17/21 ?1540 12/18/21 ?0129 12/18/21 ?1929 12/20/21 ?0143 12/20/21 ?1541 12/21/21 ?0132 12/21/21 ?1521 12/22/21 ?0117 12/22/21 ?0801 12/23/21 ?3267  ?WBC  --  10.5  --   --  6.7  --  9.2  --   --   --   --  18.3* 19.5*  ?CREATININE  --   --  5.36*   < > 5.96*   < > 4.03* 2.65* 2.07* 1.44* 1.46*  --  1.10*  ?LATICACIDVEN 6.1*  --  1.9  --   --   --   --   --   --   --   --   --   --   ? < > = values in this interval not displayed.  ?  ?Estimated Creatinine Clearance: 37.6 mL/min (A) (by C-G formula based on SCr of 1.1 mg/dL (H)).   ? ?Allergies  ?Allergen Reactions  ? Buspirone Hcl Other (See Comments)  ?  hallucinations/ vomitting  ? Codeine Other (See Comments)  ?  Nightmares   ? Sertraline Hcl Other (See Comments)  ?  GI upset/Vomiting  ? Aleve [Naproxen Sodium] Rash  ?  Takes Ibuprofen with no issues  ? Sulfamethoxazole-Trimethoprim Rash  ? ? ?Antimicrobials this admission: ?Keflex 3/24 >>3/28 ?Ceftraixone 3/28 >> (4/3) ?Merrem 4/3>> ? ?Dose adjustments this admission: ? ? ?Microbiology results: ?3/24 UCx  >100K Ecoli (resistant to Amp and Zosyn) ?3/28 UCx >>ngF ?3/28 BCx >> ngtd ? ?Ulyses Southward, PharmD, BCIDP, AAHIVP, CPP ?Infectious Disease Pharmacist ?12/23/2021 4:47 PM ? ? ?

## 2021-12-23 NOTE — Progress Notes (Signed)
Mobility Specialist Progress Note: ? ? 12/23/21 1020  ?Mobility  ?Activity Ambulated with assistance in hallway  ?Level of Assistance Modified independent, requires aide device or extra time  ?Assistive Device  ?(HHA)  ?Distance Ambulated (ft) 300 ft  ?Activity Response Tolerated well  ?$Mobility charge 1 Mobility  ? ?Pt received in bed willing to participate in mobility. No complaints of pain. Pt left in bed with call bell in reach and all needs met.  ? ?Donnia Poplaski ?Mobility Specialist ?Primary Phone [phone] ? ?

## 2021-12-23 NOTE — Progress Notes (Signed)
?Progress Note ? ?Patient: Cynthia Morrison FYB:[id number] DOB: [birth year]  ?DOA: 12/17/2021  DOS: 12/23/2021  ?  ?Brief hospital course: ?Cynthia Morrison is a 66 y.o. female with a history of HTN, depression/anxiety who presented to the ED with nausea and vomiting worsening for a few days with associated new slightly tender lump in the right groin that she couldn't push back in. She was found to have acute renal failure with granular casts and an incarcerated, strangulated right femoral hernia for which she was taken emergently for laparoscopic right femoral hernia repair with mesh and required resection of ischemic small bowel thru midline incision. Lactic acid improved 6.1 > 1.9, creatinine has improved. ? ?Assessment and Plan: ?Strangulated right femoral hernia s/p repair with mesh, small bowel resection 3/28 by Dr. Derrell Lolling:  ?- Abd is stable/benign at this time. NGT out and has had return of bowel function. Pain controlled without medications. Fluid near operative site could be seroma vs. abscess (less likely) ? ?ARF due to ATN and prerenal azotemia: Hyaline and granular casts on micro. 6-10 RBCs, +ketones, protein. Renal U/S without findings suggestive of obstructive uropathy or chronic renal disease. Only prior Cr is from >5 years ago and was 0.79. Pt has hx HTN but no other RF's for CKD. On NSAID but not often and was taking ARB. Suspect this is all acute.  ?- Improvement continues, likely found baseline of Cr at ~1.1 Deescalate IVF further. Hyponatremia developing, will give LR ?- Avoid hypotension and nephrotoxins. Needed contrast today, so hydrating as above. ? ?Hypernatremia: Resolved.   ? ?Hypokalemia: Related to poor po intake PTA but also significant GI losses ongoing. Improved, repeat supp. ? ?Recently diagnosed with E. coli UTI (3/24):  ?- Continue ceftriaxone (also received flagyl for intraabdominal source). Got perioperative cefotetan as well.  ?- Monitor blood and urine cultures.  ? ?Pneumonia: Air  bronchograms on bibasilar infiltrates on CT today. Pt without significant localizing symptoms. These presumably developed despite being on ceftriaxone.  ?- Broaden to meropenem ?- Check MRSA PCR ? ?Intraabdominal abscesses:  ?- Suspected per discusion with surgery. IR consulted for drain 4/4. Add meropenem ?- Check lactic acid given possibility of ischemic jejunum. ? ?Subjective: Eating, drinking, walking, having BMs, wants to go home very badly. Denies dyspnea, cough, chest pain. Last prn dilaudid was 3/30. ? ?Objective: ?Vitals:  ? 12/23/21 0151 12/23/21 5277 12/23/21 0949 12/23/21 1620  ?BP:  139/77 (!) 147/89 (!) 142/77  ?Pulse:  85 (!) 108 91  ?Resp:  18 18 18   ?Temp: 98.8 ?F (37.1 ?C) 98.7 ?F (37.1 ?C) 98.5 ?F (36.9 ?C) 98.6 ?F (37 ?C)  ?TempSrc: Oral Oral Oral Oral  ?SpO2:  97% 95% 94%  ?Weight:      ?Height:      ?Gen: 66 y.o. female in no distress ?Pulm: Nonlabored breathing room air. Did not hear crackles or wheezes, pulls ~750cc on IS. ?CV: Regular rate and rhythm. No murmur, rub, or gallop. No JVD, no dependent edema. ?GI: Abdomen soft, appropriately moderately tender without rebound or guarding, non-distended, with normoactive bowel sounds.  ?Ext: Warm, no deformities ?Skin: No rashes, lesions or ulcers on visualized skin. ?Neuro: Alert and oriented. No focal neurological deficits. ?Psych: Judgement and insight appear fair. Mood euthymic & affect congruent. Behavior is appropriate.   ? ?Data Personally reviewed: ?WBC 9.2 > 18.3 > 19.5k ?Hgb 13.1, plt 214 ?Cr 5.9 > 5.8 > 5.5 > 4.0 > 2.6 > 2.0 > 1.4 > 1.4 > 1.1 ?K 2.8 >  2.9 > 3.2 ?LFTs wnl ?Urine culture E. coli ?Blood cultures NGTD ?SARS-CoV-2 negative ? ?Family Communication: None at bedside ? ?Disposition: ?Status is: Inpatient ?Remains inpatient appropriate because: ARF, ileus, now intraabdominal fluid collections, PNA. ?Planned Discharge Destination: Home ? ?Tyrone Nine, MD ?12/23/2021 5:14 PM ?Page by [email or web address]  ?

## 2021-12-23 NOTE — Progress Notes (Signed)
Mobility Specialist Progress Note: ? ? 12/23/21 1426  ?Mobility  ?Activity Ambulated with assistance to bathroom;Ambulated with assistance in room  ?Level of Assistance Independent after set-up  ?Assistive Device None  ?Distance Ambulated (ft) 40 ft  ?Activity Response Tolerated well  ?$Mobility charge 1 Mobility  ? ?Pt received in bed asking to go to bathroom. Left in bed with call bell in reach and all needs met.  ? ?Kolten Ryback ?Mobility Specialist ?Primary Phone [phone] ? ?

## 2021-12-24 ENCOUNTER — Inpatient Hospital Stay (HOSPITAL_COMMUNITY): Payer: BC Managed Care – PPO

## 2021-12-24 ENCOUNTER — Encounter (HOSPITAL_COMMUNITY): Payer: Self-pay | Admitting: Internal Medicine

## 2021-12-24 DIAGNOSIS — K4130 Unilateral femoral hernia, with obstruction, without gangrene, not specified as recurrent: Secondary | ICD-10-CM | POA: Diagnosis not present

## 2021-12-24 DIAGNOSIS — N179 Acute kidney failure, unspecified: Secondary | ICD-10-CM | POA: Diagnosis not present

## 2021-12-24 LAB — CBC WITH DIFFERENTIAL/PLATELET
Abs Immature Granulocytes: 0.35 10*3/uL — ABNORMAL HIGH (ref 0.00–0.07)
Basophils Absolute: 0.1 10*3/uL (ref 0.0–0.1)
Basophils Relative: 0 %
Eosinophils Absolute: 0.3 10*3/uL (ref 0.0–0.5)
Eosinophils Relative: 1 %
HCT: 39.3 % (ref 36.0–46.0)
Hemoglobin: 13.2 g/dL (ref 12.0–15.0)
Immature Granulocytes: 1 %
Lymphocytes Relative: 4 %
Lymphs Abs: 0.8 10*3/uL (ref 0.7–4.0)
MCH: 34.2 pg — ABNORMAL HIGH (ref 26.0–34.0)
MCHC: 33.6 g/dL (ref 30.0–36.0)
MCV: 101.8 fL — ABNORMAL HIGH (ref 80.0–100.0)
Monocytes Absolute: 0.4 10*3/uL (ref 0.1–1.0)
Monocytes Relative: 2 %
Neutro Abs: 22.3 10*3/uL — ABNORMAL HIGH (ref 1.7–7.7)
Neutrophils Relative %: 92 %
Platelets: 131 10*3/uL — ABNORMAL LOW (ref 150–400)
RBC: 3.86 MIL/uL — ABNORMAL LOW (ref 3.87–5.11)
RDW: 13.3 % (ref 11.5–15.5)
WBC: 24.2 10*3/uL — ABNORMAL HIGH (ref 4.0–10.5)
nRBC: 0 % (ref 0.0–0.2)

## 2021-12-24 LAB — COMPREHENSIVE METABOLIC PANEL
ALT: 10 U/L (ref 0–44)
AST: 16 U/L (ref 15–41)
Albumin: 1.6 g/dL — ABNORMAL LOW (ref 3.5–5.0)
Alkaline Phosphatase: 55 U/L (ref 38–126)
Anion gap: 14 (ref 5–15)
BUN: 13 mg/dL (ref 8–23)
CO2: 18 mmol/L — ABNORMAL LOW (ref 22–32)
Calcium: 7.3 mg/dL — ABNORMAL LOW (ref 8.9–10.3)
Chloride: 101 mmol/L (ref 98–111)
Creatinine, Ser: 1.13 mg/dL — ABNORMAL HIGH (ref 0.44–1.00)
GFR, Estimated: 55 mL/min — ABNORMAL LOW (ref >60)
Glucose, Bld: 64 mg/dL — ABNORMAL LOW (ref 70–99)
Potassium: 3.8 mmol/L (ref 3.5–5.1)
Sodium: 133 mmol/L — ABNORMAL LOW (ref 135–145)
Total Bilirubin: 1 mg/dL (ref 0.3–1.2)
Total Protein: 4.7 g/dL — ABNORMAL LOW (ref 6.5–8.1)

## 2021-12-24 MED ORDER — FENTANYL CITRATE (PF) 100 MCG/2ML IJ SOLN
INTRAMUSCULAR | Status: AC
  Filled 2021-12-24: qty 4

## 2021-12-24 MED ORDER — DEXTROSE IN LACTATED RINGERS 5 % IV SOLN: INTRAVENOUS | Status: DC

## 2021-12-24 MED ORDER — SODIUM CHLORIDE 0.9 % IV SOLN
1.0000 g | Freq: Once | INTRAVENOUS | Status: AC
  Administered 2021-12-24: 1 g via INTRAVENOUS
  Filled 2021-12-24: qty 20

## 2021-12-24 MED ORDER — LACTATED RINGERS IV SOLN: INTRAVENOUS | Status: DC

## 2021-12-24 MED ORDER — MIDAZOLAM HCL 2 MG/2ML IJ SOLN
INTRAMUSCULAR | Status: AC
Start: 2021-12-24 — End: 2021-12-24
  Filled 2021-12-24: qty 4

## 2021-12-24 MED ORDER — SODIUM CHLORIDE 0.9% FLUSH
10.0000 mL | Freq: Two times a day (BID) | INTRAVENOUS | Status: DC
  Administered 2021-12-24 (×2): 10 mL
  Administered 2021-12-25: 5 mL
  Administered 2021-12-25 – 2021-12-26 (×2): 10 mL

## 2021-12-24 MED ORDER — LIDOCAINE HCL 1 % IJ SOLN
INTRAMUSCULAR | Status: AC
  Filled 2021-12-24: qty 10

## 2021-12-24 MED ORDER — FENTANYL CITRATE (PF) 100 MCG/2ML IJ SOLN
INTRAMUSCULAR | Status: DC | PRN
Start: 2021-12-24 — End: 2021-12-25
  Administered 2021-12-24: 25 ug via INTRAVENOUS
  Administered 2021-12-24: 50 ug via INTRAVENOUS
  Administered 2021-12-24: 25 ug via INTRAVENOUS

## 2021-12-24 MED ORDER — MIDAZOLAM HCL 2 MG/2ML IJ SOLN
INTRAMUSCULAR | Status: DC | PRN
  Administered 2021-12-24: 1 mg via INTRAVENOUS
  Administered 2021-12-24 (×2): .5 mg via INTRAVENOUS

## 2021-12-24 NOTE — Progress Notes (Signed)
Mobility Specialist Progress Note: ? ? 12/24/21 1032  ?Mobility  ?Activity Ambulated with assistance in hallway;Ambulated with assistance to bathroom  ?Level of Assistance Standby assist, set-up cues, supervision of patient - no hands on  ?Assistive Device  ?(IV pole)  ?Distance Ambulated (ft) 400 ft  ?Activity Response Tolerated well  ?$Mobility charge 1 Mobility  ? ?Pt received in bed willing to participate in mobility. No complaints of pain. Pt left in bed with call bell in reach and all needs met.  ? ?Cynthia Morrison ?Mobility Specialist ?Primary Phone [phone] ? ?

## 2021-12-24 NOTE — Progress Notes (Signed)
7 Days Post-Op  ? ?Subjective/Chief Complaint: ?NAEO. Pain controlled. Having flatus/BMs ? ?WBC 24 ? ?Objective: ?Vital signs in last 24 hours: ?Temp:  [98 ?F (36.7 ?C)-98.8 ?F (37.1 ?C)] 98.8 ?F (37.1 ?C) (04/04 0756) ?Pulse Rate:  [86-103] 87 (04/04 0756) ?Resp:  [18] 18 (04/04 0756) ?BP: (142-158)/(75-90) 154/90 (04/04 0756) ?SpO2:  [94 %-98 %] 98 % (04/04 0756) ?Last BM Date : 12/23/21 ? ?Intake/Output from previous day: ?04/03 0701 - 04/04 0700 ?In: 763 [I.V.:663; IV Piggyback:100] ?Out: 805 [Urine:805] ?Intake/Output this shift: ?No intake/output data recorded. ? ?General appearance: alert and cooperative ?Incision/Wound: Incision is clean dry and intact. Mild abd distention, no peritonitis.  ? ?Lab Results:  ?Recent Labs  ?  12/23/21 ?0314 12/24/21 ?0111  ?WBC 19.5* 24.2*  ?HGB 13.7 13.2  ?HCT 39.8 39.3  ?PLT 156 131*  ? ?BMET ?Recent Labs  ?  12/23/21 ?0314 12/24/21 ?0111  ?NA 132* 133*  ?K 3.1* 3.8  ?CL 98 101  ?CO2 23 18*  ?GLUCOSE 91 64*  ?BUN 13 13  ?CREATININE 1.10* 1.13*  ?CALCIUM 7.0* 7.3*  ? ?PT/INR ?No results for input(s): LABPROT, INR in the last 72 hours. ?ABG ?No results for input(s): PHART, HCO3 in the last 72 hours. ? ?Invalid input(s): PCO2, PO2 ? ?Studies/Results: ?CT ABDOMEN PELVIS W CONTRAST ? ?Addendum Date: 12/23/2021   ?ADDENDUM REPORT: 12/23/2021 16:16 ADDENDUM: These results were called by telephone at the time of interpretation on 12/23/2021 at 4:15 pm to provider [facility] , who verbally acknowledged these results. Electronically Signed   By: Darliss Cheney M.D.   On: 12/23/2021 16:16  ? ?Result Date: 12/23/2021 ?CLINICAL DATA:  Postoperative abdominal pain. EXAM: CT ABDOMEN AND PELVIS WITH CONTRAST TECHNIQUE: Multidetector CT imaging of the abdomen and pelvis was performed using the standard protocol following bolus administration of intravenous contrast. RADIATION DOSE REDUCTION: This exam was performed according to the departmental dose-optimization program which includes  automated exposure control, adjustment of the mA and/or kV according to patient size and/or use of iterative reconstruction technique. CONTRAST:  OMNIPAQUE IOHEXOL 300 MG/ML  SOLN COMPARISON:  None. FINDINGS: Lower chest: There is patchy airspace disease in the bilateral lower lobes and minimally in the right middle lobe with air bronchograms. Hepatobiliary: There is a hypodensity in the posterior right lobe of the liver which is too small to characterize, likely a small cyst or hemangioma. Liver, gallbladder and bile ducts are otherwise within normal limits. Pancreas: Unremarkable. No pancreatic ductal dilatation or surrounding inflammatory changes. Spleen: Normal in size without focal abnormality. Adrenals/Urinary Tract: Adrenal glands are unremarkable. Kidneys are normal, without renal calculi, focal lesion, or hydronephrosis. There is a small amount of air in the bladder. Bladder otherwise appears within normal limits. Stomach/Bowel: Oral contrast reaches the rectum. Jejunal loops are dilated with air-fluid levels measuring up to 4.5 cm in diameter. There is some questionable pneumatosis involving these bowel loops with mild wall thickening. No abrupt transition point visualized. Distal small bowel loops are nondilated. Additionally, there is some wall thickening of small bowel loops in the pelvis. The colon is nondilated. Appendix is not visualized. There is sigmoid colon diverticulosis. There is also some wall thickening of the sigmoid colon. Vascular/Lymphatic: No significant vascular findings are present. No enlarged abdominal or pelvic lymph nodes. Reproductive: Uterus and bilateral adnexa are unremarkable. Other: There is a moderate amount of free air likely related to recent surgery. There is an enhancing fluid collection with air-fluid level in the cul-de-sac measuring 4.6 x 4.7  x 9.2 cm. There is also an air-fluid collection in the anterior right pelvis image 4/69 measuring 4.4 x 5.6 by 4.1 cm.  This collection abuts the cecum. Patient is status post right inguinal hernia repair. There is an air-fluid collection in the right inguinal region measuring 3.9 x 2.3 by 4.5 cm without enhancement. There is also small amount of air in the left inguinal region. Heterogeneous fat is seen at the origin of the right inguinal canal, likely postsurgical. Musculoskeletal: There are chronic appearing compression deformities of T12, L1 and L3. IMPRESSION: 1. Status post right inguinal hernia repair. There is an air-fluid collection without enhancement in the right inguinal region measuring 3.9 x 2.3 x 4.5 cm which may represent postoperative seroma or resolving hematoma. Abscess is less likely, but not excluded. 2. There are 2 enhancing air-fluid collections in the pelvic cul-de-sac and right pelvis worrisome for abscesses. 3. Moderate free air in the abdomen, likely postsurgical. 4. Dilated jejunal loops with wall thickening and possible pneumatosis. Bowel ischemia not excluded. 5. Additional wall thickening of sigmoid colon and pelvic small bowel loops, likely reactive enteritis/colitis secondary to infection. 6. Small amount of air in the bladder, likely iatrogenic. 7. Bilateral lower lobe and right middle lobe airspace disease with air bronchograms compatible with multifocal pneumonia. Electronically Signed: By: Darliss Cheney M.D. On: 12/23/2021 16:12   ? ?Anti-infectives: ?Anti-infectives (From admission, onward)  ? ? Start     Dose/Rate Route Frequency Ordered Stop  ? 12/23/21 1800  meropenem (MERREM) 1 g in sodium chloride 0.9 % 100 mL IVPB       ? 1 g ?200 mL/hr over 30 Minutes Intravenous Every 12 hours 12/23/21 1642    ? 12/18/21 0800  cefoTEtan (CEFOTAN) 2 g in sodium chloride 0.9 % 100 mL IVPB       ? 2 g ?200 mL/hr over 30 Minutes Intravenous  Once 12/17/21 2132 12/18/21 0852  ? 12/17/21 2115  metroNIDAZOLE (FLAGYL) IVPB 500 mg       ? 500 mg ?100 mL/hr over 60 Minutes Intravenous  Once 12/17/21 2105 12/18/21  0019  ? 12/17/21 1330  cefTRIAXone (ROCEPHIN) 2 g in sodium chloride 0.9 % 100 mL IVPB       ? 2 g ?200 mL/hr over 30 Minutes Intravenous Every 24 hours 12/17/21 1326 12/23/21 1511  ? ?  ? ? ?Assessment/Plan: ?s/p Procedure(s): ?LAPAROSCOPIC RIGHT FEMORAL HERNIA REPAIR WITH MESH (Right) ?EXPLORATORY LAPAROTOMY SMALL BOWEL RESECTION WITH ANASTOMOSIS (Right) ?POD#7 ?- Pain controlled, clinically improving and having bowel function, tolerating PO. ?- WBC 24; CT A/P yesterday w/ intra-abd fluid collections concerning for abscesses, likely seroma R groin, also w/ PNA ?- IR tentatively planning perc drainage of IAA today ?- continue IV abx ?- mobilize ?- SOFT diet ?- we will follow ? ? ? LOS: 7 days  ? ? ?Francine Graven Mairlyn Tegtmeyer ?12/24/2021 ? ?

## 2021-12-24 NOTE — Procedures (Signed)
Vascular and Interventional Radiology Procedure Note ? ?Patient: Cynthia Morrison ?DOB: 10-17-1955 ?Medical Record Number: [id number] ?Note Date/Time: 12/24/21 12:52 PM  ? ?Performing Physician: Roanna Banning, MD ?Assistant(s): None ? ?Diagnosis: Pelvic abscess ? ?Procedure:  ?DRAINAGE CATHETER PLACEMENT via LEFT TRANSGLUTEAL APPROACH ? ?Anesthesia: Conscious Sedation ?Complications: None ?Estimated Blood Loss: Minimal ?Specimens: Sent for Gram Stain, Aerobe Culture, and Anerobe Culture ? ?Findings:  ?Successful CT-guided L transgluteal approach placement of 14 F catheter into pelvic abscess. ? ?Plan:  ?- Flush drain with 10 mL Normal Saline every 12 hours. ?- Follow up drain evaluation / sinogram in 2 week(s). ? ?See detailed procedure note with images in PACS. ?The patient tolerated the procedure well without incident or complication and was returned to Floor Bed in stable condition.  ? ? ?Roanna Banning, MD ?[facility] ?[facility] ? ? ?Pager. [phone] ?Clinic. [phone]  ?

## 2021-12-24 NOTE — Consult Note (Signed)
? ?Chief Complaint: ?Patient was seen in consultation today for image guided aspiration and drain placement ?Chief Complaint  ?Patient presents with  ? Emesis  ? at the request of Hosie SpangleSimaan, Elizabeth PA-C ? ?Referring Physician(s): Hosie SpangleSimaan, Elizabeth PA-C ? ?Supervising Physician: Roanna BanningMugweru, Jon ? ?[facility] ? ?History of Present Illness: ?Cynthia Morrison is a 66 y.o. female with PMHs of HTN,  depression/anxiety who presented to the ED on 3/28 with nausea and vomiting, fund to have AKI and incarcerated, strangulated right femoral hernia for which she was taken emergently for laparoscopic right femoral hernia repair with mesh and required resection of ischemic small bowel thru midline incision on 12/17/21.  ? ?Patient was admitted for further eval and management and general surgery has been following, underwent CT AP with on 12/23/21 due to abdominal pain which showed:  ? ?1. Status post right inguinal hernia repair. There is an air-fluid ?collection without enhancement in the right inguinal region ?measuring 3.9 x 2.3 x 4.5 cm which may represent postoperative ?seroma or resolving hematoma. Abscess is less likely, but not ?excluded. ?2. There are 2 enhancing air-fluid collections in the pelvic ?cul-de-sac and right pelvis worrisome for abscesses. ?3. Moderate free air in the abdomen, likely postsurgical. ?4. Dilated jejunal loops with wall thickening and possible ?pneumatosis. Bowel ischemia not excluded. ?5. Additional wall thickening of sigmoid colon and pelvic small ?bowel loops, likely reactive enteritis/colitis secondary to ?infection. ?6. Small amount of air in the bladder, likely iatrogenic. ?7. Bilateral lower lobe and right middle lobe airspace disease with ?air bronchograms compatible with multifocal pneumonia. ? ?IR was requested for image guided aspiration and possible drain placement by general surgery.  ? ?Patient laying in bed, not in acute distress.  ?Denise headache, fever, chills,  shortness of breath, cough, chest pain, abdominal pain, nausea ,vomiting, and bleeding. ? ? ? ?Past Medical History:  ?Diagnosis Date  ? Hypertension   ? ? ?Past Surgical History:  ?Procedure Laterality Date  ? BACK SURGERY    ? GANGLION CYST EXCISION Right   ? INGUINAL HERNIA REPAIR Right 12/17/2021  ? Procedure: LAPAROSCOPIC RIGHT FEMORAL HERNIA REPAIR WITH MESH;  Surgeon: Axel Filleramirez, Armando, MD;  [facility];  Service: General;  Laterality: Right;  ? INGUINAL HERNIA REPAIR Right 12/17/2021  ? Procedure: EXPLORATORY LAPAROTOMY SMALL BOWEL RESECTION WITH ANASTOMOSIS;  Surgeon: Axel Filleramirez, Armando, MD;  [facility];  Service: General;  Laterality: Right;  ? WISDOM TOOTH EXTRACTION    ? ? ?Allergies: ?Buspirone hcl, Codeine, Sertraline hcl, Aleve [naproxen sodium], and Sulfamethoxazole-trimethoprim ? ?Medications: ?Prior to Admission medications   ?Medication Sig Start Date End Date Taking? Authorizing Provider  ?amLODipine (NORVASC) 5 MG tablet Take 1 tablet (5 mg total) by mouth daily. 06/30/19  Yes Jodelle Redhristopher, Bridgette, MD  ?cephALEXin (KEFLEX) 500 MG capsule Take 1 capsule (500 mg total) by mouth 4 (four) times daily. 12/13/21  Yes Valentino NoseMartinez, Jessica A, NP  ?ibuprofen (ADVIL) 200 MG tablet Take 400 mg by mouth every 6 (six) hours as needed for mild pain.   Yes [provider]  ?losartan (COZAAR) 50 MG tablet Take 50 mg by mouth 2 (two) times daily. 06/23/19  Yes [provider]  ?ondansetron (ZOFRAN-ODT) 4 MG disintegrating tablet Take 1 tablet (4 mg total) by mouth every 8 (eight) hours as needed for nausea or vomiting. 12/13/21  Yes Valentino NoseMartinez, Jessica A, NP  ?docusate sodium (COLACE) 100 MG capsule Take 1 capsule (100 mg total) by mouth every 12 (twelve) hours. ?Patient not  taking: Reported on 12/18/2021 12/13/21   Valentino Nose, NP  ?polyethylene glycol (MIRALAX) 17 g packet Take 17 g by mouth daily. ?Patient not taking: Reported on 12/18/2021 12/13/21   Valentino Nose, NP  ?  ? ?Family  History  ?Problem Relation Age of Onset  ? Hypertension Mother   ? Heart failure Father   ? Pancreatic cancer Father   ? ? ?Social History  ? ?Socioeconomic History  ? Marital status: Single  ?  Spouse name: Not on file  ? Number of children: Not on file  ? Years of education: Not on file  ? Highest education level: Not on file  ?Occupational History  ? Not on file  ?Tobacco Use  ? Smoking status: Former  ? Smokeless tobacco: Never  ?Substance and Sexual Activity  ? Alcohol use: Yes  ? Drug use: No  ? Sexual activity: Not on file  ?Other Topics Concern  ? Not on file  ?Social History Narrative  ? Not on file  ? ?Social Determinants of Health  ? ?Financial Resource Strain: Not on file  ?Food Insecurity: Not on file  ?Transportation Needs: Not on file  ?Physical Activity: Not on file  ?Stress: Not on file  ?Social Connections: Not on file  ? ? ? ?Review of Systems: A 12 point ROS discussed and pertinent positives are indicated in the HPI above.  All other systems are negative. ? ?Vital Signs: ?BP (!) 154/90 (BP Location: Left Arm)   Pulse 87   Temp 98.8 ?F (37.1 ?C) (Oral)   Resp 18   Ht 5' (1.524 m)   Wt 120 lb (54.4 kg)   SpO2 98%   BMI 23.44 kg/m?  ? ?Physical Exam ?Vitals and nursing note reviewed.  ?Constitutional:   ?   General: Patient is not in acute distress. ?   Appearance: Normal appearance. Patient is not ill-appearing.  ?HENT:  ?   Head: Normocephalic and atraumatic.  ?   Mouth/Throat:  ?   Mouth: Mucous membranes are moist.  ?   Pharynx: Oropharynx is clear.  ?Cardiovascular:  ?   Rate and Rhythm: Normal rate and regular rhythm.  ?   Pulses: Normal pulses.  ?   Heart sounds: Normal heart sounds.  ?Pulmonary:  ?   Effort: Pulmonary effort is normal.  ?   Breath sounds: Normal breath sounds.  ?Abdominal:  ?   General: Abdomen is flat. Bowel sounds are normal.  ?   Palpations: Abdomen is soft.  ?Musculoskeletal:  ?   Cervical back: Neck supple.  ?Skin: ?   General: Skin is warm and dry.  ?    Coloration: Skin is not jaundiced or pale.  ?Neurological:  ?   Mental Status: Patient is alert and oriented to person, place, and time.  ?Psychiatric:     ?   Mood and Affect: Mood normal.     ?   Behavior: Behavior normal.     ?   Judgment: Judgment normal.  ? ? ?MD Evaluation ?Airway: WNL ?Heart: WNL ?Abdomen: WNL ?Chest/ Lungs: WNL ?ASA  Classification: 3 ?Mallampati/Airway Score: Two ? ?Imaging: ?CT ABDOMEN PELVIS W CONTRAST ? ?Addendum Date: 12/23/2021   ?ADDENDUM REPORT: 12/23/2021 16:16 ADDENDUM: These results were called by telephone at the time of interpretation on 12/23/2021 at 4:15 pm to provider [facility] , who verbally acknowledged these results. Electronically Signed   By: Darliss Cheney M.D.   On: 12/23/2021 16:16  ? ?Result Date: 12/23/2021 ?CLINICAL DATA:  Postoperative abdominal pain. EXAM: CT ABDOMEN AND PELVIS WITH CONTRAST TECHNIQUE: Multidetector CT imaging of the abdomen and pelvis was performed using the standard protocol following bolus administration of intravenous contrast. RADIATION DOSE REDUCTION: This exam was performed according to the departmental dose-optimization program which includes automated exposure control, adjustment of the mA and/or kV according to patient size and/or use of iterative reconstruction technique. CONTRAST:  OMNIPAQUE IOHEXOL 300 MG/ML  SOLN COMPARISON:  None. FINDINGS: Lower chest: There is patchy airspace disease in the bilateral lower lobes and minimally in the right middle lobe with air bronchograms. Hepatobiliary: There is a hypodensity in the posterior right lobe of the liver which is too small to characterize, likely a small cyst or hemangioma. Liver, gallbladder and bile ducts are otherwise within normal limits. Pancreas: Unremarkable. No pancreatic ductal dilatation or surrounding inflammatory changes. Spleen: Normal in size without focal abnormality. Adrenals/Urinary Tract: Adrenal glands are unremarkable. Kidneys are normal, without renal  calculi, focal lesion, or hydronephrosis. There is a small amount of air in the bladder. Bladder otherwise appears within normal limits. Stomach/Bowel: Oral contrast reaches the rectum. Jejunal loops are dilated with air-flui

## 2021-12-24 NOTE — Progress Notes (Signed)
?Progress Note ? ?Patient: Cynthia Morrison DOB: 10-26-1955  ?DOA: 12/17/2021  DOS: 12/24/2021  ?  ?Brief hospital course: ?Cynthia Morrison is a 66 y.o. female with a history of HTN, depression/anxiety who presented to the ED with nausea and vomiting worsening for a few days with associated new slightly tender lump in the right groin that she couldn't push back in. She was found to have acute renal failure with granular casts and an incarcerated, strangulated right femoral hernia for which she was taken emergently for laparoscopic right femoral hernia repair with mesh and required resection of ischemic small bowel thru midline incision. Lactic acid improved 6.1 > 1.9, creatinine has improved. The patient developed leukocytosis and tachycardia several days postoperatively for which CT was performed showing pelvic fluid collections consistent with abscess. There were also bibasilar pulmonary infiltrates. Meropenem was initiated and IR was consulted to drain pelvic abscess. ? ?Assessment and Plan: ?Strangulated right femoral hernia s/p repair with mesh, small bowel resection 3/28 by Dr. Derrell Morrison:  ?- Abd is stable/benign at this time. NGT out and has had return of bowel function. Pain controlled without medications. Fluid near operative site could be seroma vs. abscess (less likely) ? ?ARF due to ATN and prerenal azotemia: Hyaline and granular casts on micro. 6-10 RBCs, +ketones, protein. Renal U/S without findings suggestive of obstructive uropathy or chronic renal disease. Only prior Cr is from >5 years ago and was 0.79. Pt has hx HTN but no other RF's for CKD. On NSAID but not often and was taking ARB. Suspect this is all acute.  ?- Improvement continues, likely found baseline of Cr at ~1.1. ?- Avoid hypotension and nephrotoxins. Received contrast 4/3 and is NPO 4/4, so will support with IVF for now. ? ?Hypernatremia: Resolved.   ? ?Hypokalemia: Related to poor po intake PTA but also significant GI losses  ongoing. Improved with supplementation. ? ?Hypoglycemia:  ?- Add D5 to IVF. Start diet when cleared by IR and surgery. ? ?Recently diagnosed with E. coli UTI (3/24):  ?- Received ceftriaxone, full course, and perioperative cefotetan as well. Blood cultures from admission negative and urine culture from 3/28 showed no growth. ? ?Pneumonia: Air bronchograms on bibasilar infiltrates on CT. These presumably developed despite being on ceftriaxone.  ?- Broadened to meropenem following discussion with pharmacy. MRSA PCR neg. ? ?Intraabdominal abscesses:  ?- IR for pelvis abscess drain placement today. Will continue meropenem pending culture from that fluid collection. ? ?Subjective: This morning does confirm she has shortness of breath and cough "a little bit." No new complaints. Abdominal pain is stable. ? ?Objective: ?Vitals:  ? 12/24/21 1225 12/24/21 1230 12/24/21 1242 12/24/21 1302  ?BP: 136/88 139/90 133/78 134/73  ?Pulse: (!) 104 (!) 104 98 (!) 103  ?Resp: (!) 21 18 18 17   ?Temp:    99.3 ?F (37.4 ?C)  ?TempSrc:    Oral  ?SpO2: 99% 96% 94% 97%  ?Weight:      ?Height:      ?Gen: 66 y.o. female in no distress ?Pulm: Nonlabored breathing room air. Clear, diminished. ?CV: Regular rate and rhythm. No murmur, rub, or gallop. No JVD, no dependent edema. ?GI: Abdomen soft, moderately diffusely tender, non-distended, with normoactive bowel sounds.  ?Ext: Warm, no deformities ?Skin: Wound appears c/d/i. No new rashes, lesions or ulcers on visualized skin. ?Neuro: Alert and oriented. No focal neurological deficits. ?Psych: Judgement and insight appear fair. Mood euthymic & affect congruent. Behavior is appropriate.   ? ?Data Personally reviewed: ?WBC  9.2 > 18.3 > 19.5 > 24.2k ?Hgb 13.1, plt 214 ?Cr 5.9 > 5.8 > 5.5 > 4.0 > 2.6 > 2.0 > 1.4 > 1.4 > 1.1 > 1.1 ?K 2.8 > 2.9 > 3.2 ?LFTs wnl ?Urine culture E. coli ?Blood cultures NGTD ?SARS-CoV-2 negative ? ?Family Communication: None at bedside ? ?Disposition: ?Status is:  Inpatient ?Remains inpatient appropriate because: ARF, ileus, now intraabdominal fluid collections, PNA. ?Planned Discharge Destination: Home / Timing TBD ? ?Cynthia Nine, MD ?12/24/2021 1:52 PM ?Page by [email or web address]  ?

## 2021-12-24 NOTE — Discharge Instructions (Signed)
[facility]      [facility] [phone]  OPEN ABDOMINAL SURGERY: POST OP INSTRUCTIONS  Always review your discharge instruction sheet given to you by the facility where your surgery was performed.  IF YOU HAVE DISABILITY OR FAMILY LEAVE FORMS, YOU MUST BRING THEM TO THE OFFICE FOR PROCESSING.  PLEASE DO NOT GIVE THEM TO YOUR DOCTOR.  A prescription for pain medication may be given to you upon discharge.  Take your pain medication as prescribed, if needed.  If narcotic pain medicine is not needed, then you may take acetaminophen (Tylenol) or ibuprofen (Advil) as needed. Take your usually prescribed medications unless otherwise directed. If you need a refill on your pain medication, please contact your pharmacy. They will contact our office to request authorization.  Prescriptions will not be filled after 5pm or on week-ends. You should follow a light diet the first few days after arrival home, such as soup and crackers, pudding, etc.unless your doctor has advised otherwise. A high-fiber, low fat diet can be resumed as tolerated.   Be sure to include lots of fluids daily. Most patients will experience some swelling and bruising on the chest and neck area.  Ice packs will help.  Swelling and bruising can take several days to resolve Most patients will experience some swelling and bruising in the area of the incision. Ice pack will help. Swelling and bruising can take several days to resolve..  It is common to experience some constipation if taking pain medication after surgery.  Increasing fluid intake and taking a stool softener will usually help or prevent this problem from occurring.  A mild laxative (Milk of Magnesia or Miralax) should be taken according to package directions if there are no bowel movements after 48 hours.  You may have steri-strips (small skin tapes) in place directly over the incision.  These strips should be left on the skin for 7-10 days.  If your surgeon used skin  glue on the incision, you may shower in 24 hours.  The glue will flake off over the next 2-3 weeks.  Any sutures or staples will be removed at the office during your follow-up visit. You may find that a light gauze bandage over your incision may keep your staples from being rubbed or pulled. You may shower and replace the bandage daily. ACTIVITIES:  You may resume regular (light) daily activities beginning the next day--such as daily self-care, walking, climbing stairs--gradually increasing activities as tolerated.  You may have sexual intercourse when it is comfortable.  Refrain from any heavy lifting or straining until approved by your doctor. You may drive when you no longer are taking prescription pain medication, you can comfortably wear a seatbelt, and you can safely maneuver your car and apply brakes Return to Work: ___________________________________ You should see your doctor in the office for a follow-up appointment approximately two weeks after your surgery.  Make sure that you call for this appointment within a day or two after you arrive home to insure a convenient appointment time. OTHER INSTRUCTIONS:  _____________________________________________________________ _____________________________________________________________  WHEN TO CALL YOUR DOCTOR: Fever over 101.0 Inability to urinate Nausea and/or vomiting Extreme swelling or bruising Continued bleeding from incision. Increased pain, redness, or drainage from the incision. Difficulty swallowing or breathing Muscle cramping or spasms. Numbness or tingling in hands or feet or around lips.  The clinic staff is available to answer your questions during regular business hours.  Please don't hesitate to call and ask to speak to one of   the nurses if you have concerns.  For further questions, please visit [email or web address]  

## 2021-12-24 NOTE — Plan of Care (Signed)

## 2021-12-25 ENCOUNTER — Other Ambulatory Visit: Payer: Self-pay | Admitting: Student

## 2021-12-25 ENCOUNTER — Other Ambulatory Visit (HOSPITAL_COMMUNITY): Payer: Self-pay

## 2021-12-25 DIAGNOSIS — N179 Acute kidney failure, unspecified: Secondary | ICD-10-CM

## 2021-12-25 DIAGNOSIS — K651 Peritoneal abscess: Secondary | ICD-10-CM

## 2021-12-25 DIAGNOSIS — J189 Pneumonia, unspecified organism: Secondary | ICD-10-CM

## 2021-12-25 LAB — CBC WITH DIFFERENTIAL/PLATELET
Abs Immature Granulocytes: 0.25 10*3/uL — ABNORMAL HIGH (ref 0.00–0.07)
Basophils Absolute: 0 10*3/uL (ref 0.0–0.1)
Basophils Relative: 0 %
Eosinophils Absolute: 0.3 10*3/uL (ref 0.0–0.5)
Eosinophils Relative: 2 %
HCT: 38 % (ref 36.0–46.0)
Hemoglobin: 12.7 g/dL (ref 12.0–15.0)
Immature Granulocytes: 2 %
Lymphocytes Relative: 4 %
Lymphs Abs: 0.6 10*3/uL — ABNORMAL LOW (ref 0.7–4.0)
MCH: 33.3 pg (ref 26.0–34.0)
MCHC: 33.4 g/dL (ref 30.0–36.0)
MCV: 99.7 fL (ref 80.0–100.0)
Monocytes Absolute: 0.4 10*3/uL (ref 0.1–1.0)
Monocytes Relative: 3 %
Neutro Abs: 13 10*3/uL — ABNORMAL HIGH (ref 1.7–7.7)
Neutrophils Relative %: 89 %
Platelets: 130 10*3/uL — ABNORMAL LOW (ref 150–400)
RBC: 3.81 MIL/uL — ABNORMAL LOW (ref 3.87–5.11)
RDW: 13.2 % (ref 11.5–15.5)
WBC: 14.6 10*3/uL — ABNORMAL HIGH (ref 4.0–10.5)
nRBC: 0 % (ref 0.0–0.2)

## 2021-12-25 LAB — BASIC METABOLIC PANEL
Anion gap: 7 (ref 5–15)
BUN: 10 mg/dL (ref 8–23)
CO2: 24 mmol/L (ref 22–32)
Calcium: 7.5 mg/dL — ABNORMAL LOW (ref 8.9–10.3)
Chloride: 101 mmol/L (ref 98–111)
Creatinine, Ser: 0.86 mg/dL (ref 0.44–1.00)
GFR, Estimated: >60 mL/min (ref >60)
Glucose, Bld: 92 mg/dL (ref 70–99)
Potassium: 3.4 mmol/L — ABNORMAL LOW (ref 3.5–5.1)
Sodium: 132 mmol/L — ABNORMAL LOW (ref 135–145)

## 2021-12-25 MED ORDER — AMLODIPINE BESYLATE 5 MG PO TABS
5.0000 mg | ORAL_TABLET | Freq: Every day | ORAL | Status: DC
  Administered 2021-12-25 – 2021-12-26 (×2): 5 mg via ORAL
  Filled 2021-12-25 (×2): qty 1

## 2021-12-25 MED ORDER — POTASSIUM CHLORIDE 20 MEQ PO PACK
40.0000 meq | PACK | Freq: Once | ORAL | Status: AC
  Administered 2021-12-25: 40 meq via ORAL
  Filled 2021-12-25: qty 2

## 2021-12-25 MED ORDER — NORMAL SALINE FLUSH 0.9 % IV SOLN
5.0000 mL | Freq: Every day | INTRAVENOUS | 0 refills | Status: DC
  Filled 2021-12-25: qty 140, 14d supply, fill #0

## 2021-12-25 MED ORDER — SODIUM CHLORIDE 0.9 % IV SOLN
1.0000 g | Freq: Three times a day (TID) | INTRAVENOUS | Status: DC
  Administered 2021-12-25 – 2021-12-26 (×3): 1 g via INTRAVENOUS
  Filled 2021-12-25 (×5): qty 20

## 2021-12-25 NOTE — Progress Notes (Signed)
PHARMACY NOTE:  ANTIMICROBIAL RENAL DOSAGE ADJUSTMENT ? ?Current antimicrobial regimen includes a mismatch between antimicrobial dosage and estimated renal function.  As per policy approved by the Pharmacy & Therapeutics and Medical Executive Committees, the antimicrobial dosage will be adjusted accordingly. ? ?Current antimicrobial dosage:  Meropenem 1g q12h ? ?Indication: IAI ? ?Renal Function: ? ?Estimated Creatinine Clearance: 52.5 mL/min (by C-G formula based on SCr of 0.86 mg/dL). ? ?   ?Antimicrobial dosage has been changed to:  Meropenem 1g q8h  ? ?Additional comments: ? ? ?Thank you for allowing pharmacy to be a part of this patient's care. ? ?Gerrit Halls, PharmD, BCPS ?Clinical Pharmacist ?12/25/2021 9:25 AM ? ?

## 2021-12-25 NOTE — Progress Notes (Signed)
Mobility Specialist Progress Note: ? ? 12/25/21 1008  ?Mobility  ?Activity Ambulated with assistance in hallway  ?Level of Assistance Standby assist, set-up cues, supervision of patient - no hands on  ?Assistive Device  ?(Iv pole)  ?Distance Ambulated (ft) 400 ft  ?Activity Response Tolerated well  ?$Mobility charge 1 Mobility  ? ?Pt received in bed willing to participate in mobility. No complaints of pain. Left in bed with call bell in reach and all needs met.  ? ?Randilyn Foisy ?Mobility Specialist ?Primary Phone [phone] ? ?

## 2021-12-25 NOTE — Progress Notes (Signed)
Mobility Specialist Progress Note: ? ? 12/25/21 1353  ?Mobility  ?Activity Ambulated with assistance to bathroom;Ambulated with assistance in room  ?Level of Assistance Independent after set-up  ?Assistive Device None  ?Distance Ambulated (ft) 40 ft  ?Activity Response Tolerated well  ?$Mobility charge 1 Mobility  ? ?Pt received in bed needing to go to bathroom. Complaints of drain site being sore. Left in bed with call bell in reach and all needs met.  ? ?Cynthia Morrison ?Mobility Specialist ?Primary Phone [phone] ? ?

## 2021-12-25 NOTE — Progress Notes (Signed)
8 Days Post-Op  ? ?Subjective/Chief Complaint: ?NAEO. Reports soreness of her buttock around drain site. Tolerating PO, states she is hungry this AM. Having flatus and BMs. Denies fever/chills ? ?WBC 14 from 24 ? ?Objective: ?Vital signs in last 24 hours: ?Temp:  [97.9 ?F (36.6 ?C)-99.3 ?F (37.4 ?C)] 98 ?F (36.7 ?C) (04/05 0754) ?Pulse Rate:  [75-110] 94 (04/05 0754) ?Resp:  [17-26] 18 (04/05 0754) ?BP: (133-163)/(72-93) 160/85 (04/05 0754) ?SpO2:  [94 %-100 %] 98 % (04/05 0754) ?Weight:  [55.9 kg] 55.9 kg (04/05 0448) ?Last BM Date : 12/24/21 ? ?Intake/Output from previous day: ?04/04 0701 - 04/05 0700 ?In: 2230.4 [I.V.:1953.3; IV Piggyback:272] ?Out: 166 [Urine:1; Drains:165] ?Intake/Output this shift: ?No intake/output data recorded. ? ?General appearance: alert and cooperative ?Incision/Wound: Incision is clean dry and intact. Mild abd distention, no peritonitis.  ?Transgluteal drain recently emptied -- scant purulence in bulb.  ? ?Lab Results:  ?Recent Labs  ?  12/24/21 ?0111 12/25/21 ?0143  ?WBC 24.2* 14.6*  ?HGB 13.2 12.7  ?HCT 39.3 38.0  ?PLT 131* 130*  ? ?BMET ?Recent Labs  ?  12/24/21 ?0111 12/25/21 ?0143  ?NA 133* 132*  ?K 3.8 3.4*  ?CL 101 101  ?CO2 18* 24  ?GLUCOSE 64* 92  ?BUN 13 10  ?CREATININE 1.13* 0.86  ?CALCIUM 7.3* 7.5*  ? ?PT/INR ?No results for input(s): LABPROT, INR in the last 72 hours. ?ABG ?No results for input(s): PHART, HCO3 in the last 72 hours. ? ?Invalid input(s): PCO2, PO2 ? ?Studies/Results: ?CT ABDOMEN PELVIS W CONTRAST ? ?Addendum Date: 12/23/2021   ?ADDENDUM REPORT: 12/23/2021 16:16 ADDENDUM: These results were called by telephone at the time of interpretation on 12/23/2021 at 4:15 pm to provider [facility] , who verbally acknowledged these results. Electronically Signed   By: Darliss Cheney M.D.   On: 12/23/2021 16:16  ? ?Result Date: 12/23/2021 ?CLINICAL DATA:  Postoperative abdominal pain. EXAM: CT ABDOMEN AND PELVIS WITH CONTRAST TECHNIQUE: Multidetector CT imaging of the  abdomen and pelvis was performed using the standard protocol following bolus administration of intravenous contrast. RADIATION DOSE REDUCTION: This exam was performed according to the departmental dose-optimization program which includes automated exposure control, adjustment of the mA and/or kV according to patient size and/or use of iterative reconstruction technique. CONTRAST:  OMNIPAQUE IOHEXOL 300 MG/ML  SOLN COMPARISON:  None. FINDINGS: Lower chest: There is patchy airspace disease in the bilateral lower lobes and minimally in the right middle lobe with air bronchograms. Hepatobiliary: There is a hypodensity in the posterior right lobe of the liver which is too small to characterize, likely a small cyst or hemangioma. Liver, gallbladder and bile ducts are otherwise within normal limits. Pancreas: Unremarkable. No pancreatic ductal dilatation or surrounding inflammatory changes. Spleen: Normal in size without focal abnormality. Adrenals/Urinary Tract: Adrenal glands are unremarkable. Kidneys are normal, without renal calculi, focal lesion, or hydronephrosis. There is a small amount of air in the bladder. Bladder otherwise appears within normal limits. Stomach/Bowel: Oral contrast reaches the rectum. Jejunal loops are dilated with air-fluid levels measuring up to 4.5 cm in diameter. There is some questionable pneumatosis involving these bowel loops with mild wall thickening. No abrupt transition point visualized. Distal small bowel loops are nondilated. Additionally, there is some wall thickening of small bowel loops in the pelvis. The colon is nondilated. Appendix is not visualized. There is sigmoid colon diverticulosis. There is also some wall thickening of the sigmoid colon. Vascular/Lymphatic: No significant vascular findings are present. No enlarged abdominal or pelvic  lymph nodes. Reproductive: Uterus and bilateral adnexa are unremarkable. Other: There is a moderate amount of free air likely related  to recent surgery. There is an enhancing fluid collection with air-fluid level in the cul-de-sac measuring 4.6 x 4.7 x 9.2 cm. There is also an air-fluid collection in the anterior right pelvis image 4/69 measuring 4.4 x 5.6 by 4.1 cm. This collection abuts the cecum. Patient is status post right inguinal hernia repair. There is an air-fluid collection in the right inguinal region measuring 3.9 x 2.3 by 4.5 cm without enhancement. There is also small amount of air in the left inguinal region. Heterogeneous fat is seen at the origin of the right inguinal canal, likely postsurgical. Musculoskeletal: There are chronic appearing compression deformities of T12, L1 and L3. IMPRESSION: 1. Status post right inguinal hernia repair. There is an air-fluid collection without enhancement in the right inguinal region measuring 3.9 x 2.3 x 4.5 cm which may represent postoperative seroma or resolving hematoma. Abscess is less likely, but not excluded. 2. There are 2 enhancing air-fluid collections in the pelvic cul-de-sac and right pelvis worrisome for abscesses. 3. Moderate free air in the abdomen, likely postsurgical. 4. Dilated jejunal loops with wall thickening and possible pneumatosis. Bowel ischemia not excluded. 5. Additional wall thickening of sigmoid colon and pelvic small bowel loops, likely reactive enteritis/colitis secondary to infection. 6. Small amount of air in the bladder, likely iatrogenic. 7. Bilateral lower lobe and right middle lobe airspace disease with air bronchograms compatible with multifocal pneumonia. Electronically Signed: By: Darliss CheneyAmy  Guttmann M.D. On: 12/23/2021 16:12  ? ?CT IMAGE GUIDED DRAINAGE BY PERCUTANEOUS CATHETER ? ?Result Date: 12/24/2021 ?INDICATION: Briefly, 66 year old female with incarcerated inguinal hernia repair and laparotomy, with postoperative pelvic abscess. EXAM: CT GUIDED PERCUTANEOUS DRAINAGE CATHETER PLACEMENT via a LEFT TRANS GLUTEAL APPROACH RADIATION DOSE REDUCTION: This exam was  performed according to the departmental dose-optimization program which includes automated exposure control, adjustment of the mA and/or kV according to patient size and/or use of iterative reconstruction technique. COMPARISON:  CT AP, 12/23/2021. MEDICATIONS: The patient is currently admitted to the hospital and receiving intravenous antibiotics. The antibiotics were administered within an appropriate time frame prior to the initiation of the procedure. ANESTHESIA/SEDATION: Moderate (conscious) sedation was employed during this procedure. A total of Versed 2 mg and Fentanyl 100 mcg was administered intravenously. Moderate Sedation Time: 19 minutes. The patient's level of consciousness and vital signs were monitored continuously by radiology nursing throughout the procedure under my direct supervision. CONTRAST:  None COMPLICATIONS: None immediate. PROCEDURE: Informed written consent was obtained from the patient after a discussion of the risks, benefits and alternatives to treatment. The patient was placed prone on the CT gantry and a pre procedural CT was performed re-demonstrating the known abscess/fluid collection within the dependent pelvis. The procedure was planned. A timeout was performed prior to the initiation of the procedure. The LEFT gluteus was prepped and draped in the usual sterile fashion. The overlying soft tissues were anesthetized with 1% lidocaine with epinephrine. Appropriate trajectory was planned with the use of a 22 gauge spinal needle. An 18 gauge trocar needle was advanced into the abscess/fluid collection and a short Amplatz super stiff wire was coiled within the collection. Appropriate positioning was confirmed with a limited CT scan. The tract was serially dilated allowing placement of a 14 Fr Skater drainage catheter. Appropriate positioning was confirmed with a limited postprocedural CT scan. 10 mL of purulent fluid was aspirated. The tube was connected to a suction  bulb and sutured  in place. A dressing was placed. The patient tolerated the procedure well without immediate post procedural complication. IMPRESSION: Successful CT guided placement of a 14 Fr drainage catheter into the pel

## 2021-12-25 NOTE — Progress Notes (Addendum)
? ? ?Referring Physician(s): Hosie Spangle PA-C ? ?Supervising Physician: Gilmer Mor ? ?Patient Status:  [facility] - In-pt ? ?Chief Complaint: ? ?Pt with incarcerated, strangulated right femoral hernia s/p repair with mesh and required resection of ischemic small bowel thru midline incision on 12/17/21, recover complicated by pelvic fluid collection development, s/p LT TG drain placement by Dr. Milford Cage on 4/5.  ? ?Subjective: ? ?Patient laying bed, NAD.  ?Reports she is little sore around the drain.  ?States that she will be discharged tomorrow.  ?Discussed drain care and follow up, asked patient to flush the drain once a day and record output daily.  ?Patient verbalized understanding.  ? ?Allergies: ?Buspirone hcl, Codeine, Sertraline hcl, Aleve [naproxen sodium], and Sulfamethoxazole-trimethoprim ? ?Medications: ?Prior to Admission medications   ?Medication Sig Start Date End Date Taking? Authorizing Provider  ?amLODipine (NORVASC) 5 MG tablet Take 1 tablet (5 mg total) by mouth daily. 06/30/19  Yes Jodelle Red, MD  ?cephALEXin (KEFLEX) 500 MG capsule Take 1 capsule (500 mg total) by mouth 4 (four) times daily. 12/13/21  Yes Valentino Nose, NP  ?ibuprofen (ADVIL) 200 MG tablet Take 400 mg by mouth every 6 (six) hours as needed for mild pain.   Yes [provider]  ?losartan (COZAAR) 50 MG tablet Take 50 mg by mouth 2 (two) times daily. 06/23/19  Yes [provider]  ?ondansetron (ZOFRAN-ODT) 4 MG disintegrating tablet Take 1 tablet (4 mg total) by mouth every 8 (eight) hours as needed for nausea or vomiting. 12/13/21  Yes Valentino Nose, NP  ?docusate sodium (COLACE) 100 MG capsule Take 1 capsule (100 mg total) by mouth every 12 (twelve) hours. ?Patient not taking: Reported on 12/18/2021 12/13/21   Valentino Nose, NP  ?polyethylene glycol (MIRALAX) 17 g packet Take 17 g by mouth daily. ?Patient not taking: Reported on 12/18/2021 12/13/21   Valentino Nose, NP  ? ? ? ?Vital  Signs: ?BP (!) 160/85 (BP Location: Right Arm)   Pulse 94   Temp 98 ?F (36.7 ?C) (Oral)   Resp 18   Ht 5' (1.524 m)   Wt 123 lb 3.8 oz (55.9 kg)   SpO2 98%   BMI 24.07 kg/m?  ? ?Physical Exam ?Vitals reviewed.  ?Constitutional:   ?   General: She is not in acute distress. ?   Appearance: Normal appearance. She is not ill-appearing.  ?HENT:  ?   Head: Normocephalic and atraumatic.  ?Pulmonary:  ?   Effort: Pulmonary effort is normal.  ?Abdominal:  ?   General: Abdomen is flat. Bowel sounds are normal.  ?   Palpations: Abdomen is soft.  ?Skin: ?   General: Skin is warm and dry.  ?   Coloration: Skin is not jaundiced or pale.  ?   Comments: Positive Lt TG drain to a suction bulb. Site is unremarkable with no erythema, edema, tenderness, bleeding or drainage. Suture and stat lock in place. Dressing is clean, dry, and intact. Trace  of  clear yellow colored fluid noted in the bulb. Drain aspirates and flushes well.  ?  ?Neurological:  ?   Mental Status: She is alert and oriented to person, place, and time.  ?Psychiatric:     ?   Mood and Affect: Mood normal.     ?   Behavior: Behavior normal.  ? ? ?Imaging: ?CT ABDOMEN PELVIS W CONTRAST ? ?Addendum Date: 12/23/2021   ?ADDENDUM REPORT: 12/23/2021 16:16 ADDENDUM: These results were called by telephone at the time  of interpretation on 12/23/2021 at 4:15 pm to provider [facility] , who verbally acknowledged these results. Electronically Signed   By: Darliss Cheney M.D.   On: 12/23/2021 16:16  ? ?Result Date: 12/23/2021 ?CLINICAL DATA:  Postoperative abdominal pain. EXAM: CT ABDOMEN AND PELVIS WITH CONTRAST TECHNIQUE: Multidetector CT imaging of the abdomen and pelvis was performed using the standard protocol following bolus administration of intravenous contrast. RADIATION DOSE REDUCTION: This exam was performed according to the departmental dose-optimization program which includes automated exposure control, adjustment of the mA and/or kV according to patient size  and/or use of iterative reconstruction technique. CONTRAST:  OMNIPAQUE IOHEXOL 300 MG/ML  SOLN COMPARISON:  None. FINDINGS: Lower chest: There is patchy airspace disease in the bilateral lower lobes and minimally in the right middle lobe with air bronchograms. Hepatobiliary: There is a hypodensity in the posterior right lobe of the liver which is too small to characterize, likely a small cyst or hemangioma. Liver, gallbladder and bile ducts are otherwise within normal limits. Pancreas: Unremarkable. No pancreatic ductal dilatation or surrounding inflammatory changes. Spleen: Normal in size without focal abnormality. Adrenals/Urinary Tract: Adrenal glands are unremarkable. Kidneys are normal, without renal calculi, focal lesion, or hydronephrosis. There is a small amount of air in the bladder. Bladder otherwise appears within normal limits. Stomach/Bowel: Oral contrast reaches the rectum. Jejunal loops are dilated with air-fluid levels measuring up to 4.5 cm in diameter. There is some questionable pneumatosis involving these bowel loops with mild wall thickening. No abrupt transition point visualized. Distal small bowel loops are nondilated. Additionally, there is some wall thickening of small bowel loops in the pelvis. The colon is nondilated. Appendix is not visualized. There is sigmoid colon diverticulosis. There is also some wall thickening of the sigmoid colon. Vascular/Lymphatic: No significant vascular findings are present. No enlarged abdominal or pelvic lymph nodes. Reproductive: Uterus and bilateral adnexa are unremarkable. Other: There is a moderate amount of free air likely related to recent surgery. There is an enhancing fluid collection with air-fluid level in the cul-de-sac measuring 4.6 x 4.7 x 9.2 cm. There is also an air-fluid collection in the anterior right pelvis image 4/69 measuring 4.4 x 5.6 by 4.1 cm. This collection abuts the cecum. Patient is status post right inguinal hernia repair.  There is an air-fluid collection in the right inguinal region measuring 3.9 x 2.3 by 4.5 cm without enhancement. There is also small amount of air in the left inguinal region. Heterogeneous fat is seen at the origin of the right inguinal canal, likely postsurgical. Musculoskeletal: There are chronic appearing compression deformities of T12, L1 and L3. IMPRESSION: 1. Status post right inguinal hernia repair. There is an air-fluid collection without enhancement in the right inguinal region measuring 3.9 x 2.3 x 4.5 cm which may represent postoperative seroma or resolving hematoma. Abscess is less likely, but not excluded. 2. There are 2 enhancing air-fluid collections in the pelvic cul-de-sac and right pelvis worrisome for abscesses. 3. Moderate free air in the abdomen, likely postsurgical. 4. Dilated jejunal loops with wall thickening and possible pneumatosis. Bowel ischemia not excluded. 5. Additional wall thickening of sigmoid colon and pelvic small bowel loops, likely reactive enteritis/colitis secondary to infection. 6. Small amount of air in the bladder, likely iatrogenic. 7. Bilateral lower lobe and right middle lobe airspace disease with air bronchograms compatible with multifocal pneumonia. Electronically Signed: By: Darliss Cheney M.D. On: 12/23/2021 16:12  ? ?CT IMAGE GUIDED DRAINAGE BY PERCUTANEOUS CATHETER ? ?Result Date: 12/24/2021 ?INDICATION:  Briefly, 66 year old female with incarcerated inguinal hernia repair and laparotomy, with postoperative pelvic abscess. EXAM: CT GUIDED PERCUTANEOUS DRAINAGE CATHETER PLACEMENT via a LEFT TRANS GLUTEAL APPROACH RADIATION DOSE REDUCTION: This exam was performed according to the departmental dose-optimization program which includes automated exposure control, adjustment of the mA and/or kV according to patient size and/or use of iterative reconstruction technique. COMPARISON:  CT AP, 12/23/2021. MEDICATIONS: The patient is currently admitted to the hospital and  receiving intravenous antibiotics. The antibiotics were administered within an appropriate time frame prior to the initiation of the procedure. ANESTHESIA/SEDATION: Moderate (conscious) sedation was employed during [employer]

## 2021-12-25 NOTE — Progress Notes (Signed)
?PROGRESS NOTE ? ?Cynthia Morrison  VZC:[id number] DOB: [birth year] DOA: 12/17/2021 ?PCP: Hillery Aldo, PA-C  ? ?Brief Narrative: ?Patient is a 66 year old female with history of hypertension, depression, anxiety who presented here with complaints of nausea, vomiting, right groin pain with lump.  On presentation, she was found to be in acute renal failure with granular cast, found to have incarcerated/strangulated right femoral hernia for which she was taken emergently for laparoscopic right femoral hernia repair with mesh and required resection of ischemic small bowel from midline incision.  Renal function gradually improved.  Hospital course remarkable for leukocytosis, tachycardia.  Follow-up CT scan showed pelvic fluid collection consistent with abscess, bibasilar pulmonary infiltrates.  Started on meropenem, IR consulted and she underwent drain placement for pelvic abscesses.  General surgery following.  Possible discharge tomorrow to home. ? ? ?Assessment & Plan: ? ?Principal Problem: ?  Incarcerated femoral hernia ?Active Problems: ?  AKI (acute kidney injury) (HCC) ?  Intra-abdominal abscess (HCC) ?  Acute renal failure (ARF) (HCC) ?  Bilateral pneumonia ? ?Strangulated right femoral hernia: Presented with nausea, vomiting, right groin pain with lump.  Found to have a strangulated right femoral hernia.  Underwent emergent hernia repair with mesh, small bowel resection on 3/28.  NG tube out, bowels functioning.  On soft diet ? ?Intra-abdominal abscess: Patient was having leukocytosis, tachycardia during this hospitalization.  Follow-up CT scan showed pelvic fluid collection consistent with abscess.  IR consulted and she underwent drain placement via left transgluteal approach for pelvic abscess on 4/4.  IR following.  Continue current antibiotics.  Follow-up cultures.  Leukocytosis improving ? ?AKI: Secondary to strangulated hernia.  Ultrasound of the kidneys did not show any obstructive uropathy or chronic  renal disease.  AKI resolved with IV fluids ? ?Bilateral pneumonia: CT follow-up showed air bronchograms, bilateral basilar infiltrates.  Currently she is on room air.  Continue current antibiotics.  MRSA PCR negative ? ?Hyponatremia: Mild.  Continue to monitor ? ?Hypokalemia: Supplemented with potassium.  Will be monitored ? ?Hypertension: Monitor BP.  Continue amlodipine ? ? ? ? ? ?  ?  ? ?DVT prophylaxis:enoxaparin (LOVENOX) injection 40 mg Start: 12/25/21 0600 ?SCD's Start: 12/17/21 2307 ? ? ?  Code Status: Full Code ? ?Family Communication: None at the bedside ? ?Patient status: Inpatient ? ?Patient is from : Home ? ?Anticipated discharge to: Home ? ?Estimated DC date: Tomorrow ? ?Consultants: General surgery, IR ? ?Procedures: Drain placement, hernia repair ? ?Antimicrobials:  ?Anti-infectives (From admission, onward)  ? ? Start     Dose/Rate Route Frequency Ordered Stop  ? 12/25/21 1500  meropenem (MERREM) 1 g in sodium chloride 0.9 % 100 mL IVPB       ? 1 g ?200 mL/hr over 30 Minutes Intravenous Every 8 hours 12/25/21 0925    ? 12/24/21 2000  meropenem (MERREM) 1 g in sodium chloride 0.9 % 100 mL IVPB       ? 1 g ?200 mL/hr over 30 Minutes Intravenous  Once 12/24/21 1904 12/24/21 2115  ? 12/23/21 1800  meropenem (MERREM) 1 g in sodium chloride 0.9 % 100 mL IVPB  Status:  Discontinued       ? 1 g ?200 mL/hr over 30 Minutes Intravenous Every 12 hours 12/23/21 1642 12/25/21 0925  ? 12/18/21 0800  cefoTEtan (CEFOTAN) 2 g in sodium chloride 0.9 % 100 mL IVPB       ? 2 g ?200 mL/hr over 30 Minutes Intravenous  Once 12/17/21 2132 12/18/21 0852  ?  12/17/21 2115  metroNIDAZOLE (FLAGYL) IVPB 500 mg       ? 500 mg ?100 mL/hr over 60 Minutes Intravenous  Once 12/17/21 2105 12/18/21 0019  ? 12/17/21 1330  cefTRIAXone (ROCEPHIN) 2 g in sodium chloride 0.9 % 100 mL IVPB       ? 2 g ?200 mL/hr over 30 Minutes Intravenous Every 24 hours 12/17/21 1326 12/23/21 1511  ? ?  ? ? ?Subjective: ?Patient seen and examined at the  bedside this morning.  Hemodynamically stable.  Lying in bed.  Denies any abdomen pain, nausea or vomiting.  She had a bowel movement today.  Tolerating diet.  Denies any shortness of breath, on room air ? ?Objective: ?Vitals:  ? 12/24/21 1302 12/24/21 2109 12/25/21 0448 12/25/21 0754  ?BP: 134/73 (!) 145/72 (!) 163/79 (!) 160/85  ?Pulse: (!) 103 75 87 94  ?Resp: 17 18 18 18   ?Temp: 99.3 ?F (37.4 ?C) 98 ?F (36.7 ?C) 97.9 ?F (36.6 ?C) 98 ?F (36.7 ?C)  ?TempSrc: Oral Oral Oral Oral  ?SpO2: 97% 97% 96% 98%  ?Weight:   55.9 kg   ?Height:      ? ? ?Intake/Output Summary (Last 24 hours) at 12/25/2021 1005 ?Last data filed at 12/25/2021 0640 ?Gross per 24 hour  ?Intake 2230.37 ml  ?Output 166 ml  ?Net 2064.37 ml  ? ?Filed Weights  ? 12/17/21 1928 12/25/21 0448  ?Weight: 54.4 kg 55.9 kg  ? ? ?Examination: ? ?General exam: Overall comfortable, not in distress ?HEENT: PERRL ?Respiratory system: Few bilateral crackles ?Cardiovascular system: S1 & S2 heard, RRR.  ?Gastrointestinal system: Abdomen is nondistended, soft and nontender.  Transgluteal drain on the left ?Central nervous system: Alert and oriented ?Extremities: No edema, no clubbing ,no cyanosis ?Skin: No rashes, no ulcers,no icterus   ? ? ?Data Reviewed: I have personally reviewed following labs and imaging studies ? ?CBC: ?Recent Labs  ?Lab 12/20/21 ?0143 12/22/21 ?0801 12/23/21 ?[id number] 12/24/21 ?0111 12/25/21 ?0143  ?WBC 9.2 18.3* 19.5* 24.2* 14.6*  ?NEUTROABS  --   --  17.2* 22.3* 13.0*  ?HGB 13.1 15.2* 13.7 13.2 12.7  ?HCT 39.3 44.9 39.8 39.3 38.0  ?MCV 101.6* 100.7* 100.0 101.8* 99.7  ?PLT 214 209 156 131* 130*  ? ?Basic Metabolic Panel: ?Recent Labs  ?Lab 12/20/21 ?1541 12/21/21 ?0132 12/21/21 ?1521 12/22/21 ?0117 12/23/21 ?[id number] 12/24/21 ?0111 12/25/21 ?0143  ?NA 144 147* 142 140 132* 133* 132*  ?K 2.9* 3.2* 3.2* 4.0 3.1* 3.8 3.4*  ?CL 96* 99 97* 97* 98 101 101  ?CO2 32 35* 32 35* 23 18* 24  ?GLUCOSE 134* 108* 145* 166* 91 64* 92  ?BUN 51* 39* 27* 19 13 13 10    ?CREATININE 2.65* 2.07* 1.44* 1.46* 1.10* 1.13* 0.86  ?CALCIUM 8.4* 8.7* 8.1* 7.8* 7.0* 7.3* 7.5*  ?MG 2.1 2.1  --   --   --   --   --   ? ? ? ?Recent Results (from the past 240 hour(s))  ?Urine Culture     Status: None  ? Collection Time: 12/17/21  1:24 PM  ? Specimen: Urine, Clean Catch  ?Result Value Ref Range Status  ? Specimen Description URINE, CLEAN CATCH  Final  ? Special Requests NONE  Final  ? Culture   Final  ?  NO GROWTH ?Performed at [facility], [address] [place], [place] [address] ?  ? Report Status 12/19/2021 FINAL  Final  ?Resp Panel by RT-PCR (Flu A&B, Covid) Nasopharyngeal Swab  Status: None  ? Collection Time: 12/17/21  1:25 PM  ? Specimen: Nasopharyngeal Swab; Nasopharyngeal(NP) swabs in vial transport medium  ?Result Value Ref Range Status  ? SARS Coronavirus 2 by RT PCR NEGATIVE NEGATIVE Final  ?  Comment: (NOTE) ?SARS-CoV-2 target nucleic acids are NOT DETECTED. ? ?The SARS-CoV-2 RNA is generally detectable in upper respiratory ?specimens during the acute phase of infection. The lowest ?concentration of SARS-CoV-2 viral copies this assay can detect is ?138 copies/mL. A negative result does not preclude SARS-Cov-2 ?infection and should not be used as the sole basis for treatment or ?other patient management decisions. A negative result may occur with  ?improper specimen collection/handling, submission of specimen other ?than nasopharyngeal swab, presence of viral mutation(s) within the ?areas targeted by this assay, and inadequate number of viral ?copies(<138 copies/mL). A negative result must be combined with ?clinical observations, patient history, and epidemiological ?information. The expected result is Negative. ? ?Fact Sheet for Patients:  ?BloggerCourse.com ? ?Fact Sheet for Healthcare Providers:  ?SeriousBroker.it ? ?This test is no t yet approved or cleared by the [place] FDA and  ?has been authorized for detection  and/or diagnosis of SARS-CoV-2 by ?FDA under an Emergency Use Authorization (EUA). This EUA will remain  ?in effect (meaning this test can be used) for the duration of the ?COVID-19 declaration under Section 5

## 2021-12-26 ENCOUNTER — Other Ambulatory Visit (HOSPITAL_COMMUNITY): Payer: Self-pay

## 2021-12-26 LAB — CBC WITH DIFFERENTIAL/PLATELET
Abs Immature Granulocytes: 0.22 K/uL — ABNORMAL HIGH (ref 0.00–0.07)
Basophils Absolute: 0 K/uL (ref 0.0–0.1)
Basophils Relative: 0 %
Eosinophils Absolute: 0.2 K/uL (ref 0.0–0.5)
Eosinophils Relative: 2 %
HCT: 39.2 % (ref 36.0–46.0)
Hemoglobin: 13.5 g/dL (ref 12.0–15.0)
Immature Granulocytes: 1 %
Lymphocytes Relative: 7 %
Lymphs Abs: 1.1 K/uL (ref 0.7–4.0)
MCH: 34.3 pg — ABNORMAL HIGH (ref 26.0–34.0)
MCHC: 34.4 g/dL (ref 30.0–36.0)
MCV: 99.5 fL (ref 80.0–100.0)
Monocytes Absolute: 0.6 K/uL (ref 0.1–1.0)
Monocytes Relative: 4 %
Neutro Abs: 13.4 K/uL — ABNORMAL HIGH (ref 1.7–7.7)
Neutrophils Relative %: 86 %
Platelets: 167 K/uL (ref 150–400)
RBC: 3.94 MIL/uL (ref 3.87–5.11)
RDW: 13.1 % (ref 11.5–15.5)
WBC: 15.5 K/uL — ABNORMAL HIGH (ref 4.0–10.5)
nRBC: 0 % (ref 0.0–0.2)

## 2021-12-26 LAB — BASIC METABOLIC PANEL WITH GFR
Anion gap: 10 (ref 5–15)
BUN: 7 mg/dL — ABNORMAL LOW (ref 8–23)
CO2: 22 mmol/L (ref 22–32)
Calcium: 7.5 mg/dL — ABNORMAL LOW (ref 8.9–10.3)
Chloride: 100 mmol/L (ref 98–111)
Creatinine, Ser: 0.86 mg/dL (ref 0.44–1.00)
GFR, Estimated: >60 mL/min
Glucose, Bld: 84 mg/dL (ref 70–99)
Potassium: 4.1 mmol/L (ref 3.5–5.1)
Sodium: 132 mmol/L — ABNORMAL LOW (ref 135–145)

## 2021-12-26 MED ORDER — FLUCONAZOLE 200 MG PO TABS
400.0000 mg | ORAL_TABLET | Freq: Every day | ORAL | 0 refills | Status: AC
Start: 2021-12-26 — End: 2022-01-05

## 2021-12-26 MED ORDER — FLUCONAZOLE 100 MG PO TABS: 100.0000 mg | ORAL_TABLET | Freq: Every day | ORAL | 0 refills | Status: DC

## 2021-12-26 MED ORDER — AMOXICILLIN-POT CLAVULANATE 875-125 MG PO TABS: 1.0000 | ORAL_TABLET | Freq: Two times a day (BID) | ORAL | 0 refills | Status: AC

## 2021-12-26 NOTE — Progress Notes (Signed)
Piv dcd. Sites unremarkable. Ccmd notified of patients dc order. Patients dtr will be here around 1 pm to bring clothing and will take patient home. Patient requested to have dtr to listen to the dc instructions. ?

## 2021-12-26 NOTE — Progress Notes (Signed)
Nonnie Done to be D/C'd  per MD order.  Discussed with the patient and daughter all questions fully answered. ? ?VSS, Skin clean, dry and intact without evidence of skin break down, no evidence of skin tears noted. ? ?An After Visit Summary was printed and given to the patient.  ? ?D/c education completed with patient/family including follow up instructions, medication list, d/c activities limitations if indicated, with other d/c instructions as indicated by MD - patient able to verbalize understanding, all questions fully answered. Daughter demonstrated how to flush patient JP drain and change her dressing. ? ?Patient instructed to return to ED, call 911, or call MD for any changes in condition.  ? ?Patient to be escorted via WC, and D/C home via private auto.  ?

## 2021-12-26 NOTE — Progress Notes (Signed)
? ? ?Referring Physician(s): Obie Dredge PA-C ? ?Supervising Physician: Mir, Sharen Heck ? ?Patient Status:  [facility] - In-pt ? ?Chief Complaint: ?Pelvic fluid collection s/p LT TG drain placement by Dr. Maryelizabeth Kaufmann on 4/5.  ? ?Subjective: ?Resting comfortably.  Drain remains in place. Soreness improved.  Ongoing significant output.  ? ? ?Allergies: ?Buspirone hcl, Codeine, Sertraline hcl, Aleve [naproxen sodium], and Sulfamethoxazole-trimethoprim ? ?Medications: ?Prior to Admission medications   ?Medication Sig Start Date End Date Taking? Authorizing Provider  ?amLODipine (NORVASC) 5 MG tablet Take 1 tablet (5 mg total) by mouth daily. 06/30/19  Yes Buford Dresser, MD  ?cephALEXin (KEFLEX) 500 MG capsule Take 1 capsule (500 mg total) by mouth 4 (four) times daily. 12/13/21  Yes Eulogio Bear, NP  ?ibuprofen (ADVIL) 200 MG tablet Take 400 mg by mouth every 6 (six) hours as needed for mild pain.   Yes [provider]  ?losartan (COZAAR) 50 MG tablet Take 50 mg by mouth 2 (two) times daily. 06/23/19  Yes [provider]  ?ondansetron (ZOFRAN-ODT) 4 MG disintegrating tablet Take 1 tablet (4 mg total) by mouth every 8 (eight) hours as needed for nausea or vomiting. 12/13/21  Yes Eulogio Bear, NP  ?docusate sodium (COLACE) 100 MG capsule Take 1 capsule (100 mg total) by mouth every 12 (twelve) hours. ?Patient not taking: Reported on 12/18/2021 12/13/21   Eulogio Bear, NP  ?polyethylene glycol (MIRALAX) 17 g packet Take 17 g by mouth daily. ?Patient not taking: Reported on 12/18/2021 12/13/21   Eulogio Bear, NP  ? ? ? ?Vital Signs: ?BP (!) 146/83 (BP Location: Left Arm)   Pulse 97   Temp 98.5 ?F (36.9 ?C) (Oral)   Resp 20   Ht 5' (1.524 m)   Wt 123 lb 3.8 oz (55.9 kg)   SpO2 99%   BMI 24.07 kg/m?  ? ?Physical Exam ?Vitals reviewed.  ?Constitutional:   ?   General: She is not in acute distress. ?   Appearance: Normal appearance. She is not ill-appearing.  ?Skin: ?   General:  Skin is warm and dry.  ?   Comments: L TG drain in place.  Insertion site c/d/I. Significant purulent drainage in bulb.  ?  ?Neurological:  ?   Mental Status: She is alert and oriented to person, place, and time.  ?Psychiatric:     ?   Mood and Affect: Mood normal.     ?   Behavior: Behavior normal.  ? ? ?Imaging: ?CT ABDOMEN PELVIS W CONTRAST ? ?Addendum Date: 12/23/2021   ?ADDENDUM REPORT: 12/23/2021 16:16 ADDENDUM: These results were called by telephone at the time of interpretation on 12/23/2021 at 4:15 pm to provider [facility] , who verbally acknowledged these results. Electronically Signed   By: Ronney Asters M.D.   On: 12/23/2021 16:16  ? ?Result Date: 12/23/2021 ?CLINICAL DATA:  Postoperative abdominal pain. EXAM: CT ABDOMEN AND PELVIS WITH CONTRAST TECHNIQUE: Multidetector CT imaging of the abdomen and pelvis was performed using the standard protocol following bolus administration of intravenous contrast. RADIATION DOSE REDUCTION: This exam was performed according to the departmental dose-optimization program which includes automated exposure control, adjustment of the mA and/or kV according to patient size and/or use of iterative reconstruction technique. CONTRAST:  144mL OMNIPAQUE IOHEXOL 300 MG/ML  SOLN COMPARISON:  None. FINDINGS: Lower chest: There is patchy airspace disease in the bilateral lower lobes and minimally in the right middle lobe with air bronchograms. Hepatobiliary: There is a hypodensity in the posterior right  lobe of the liver which is too small to characterize, likely a small cyst or hemangioma. Liver, gallbladder and bile ducts are otherwise within normal limits. Pancreas: Unremarkable. No pancreatic ductal dilatation or surrounding inflammatory changes. Spleen: Normal in size without focal abnormality. Adrenals/Urinary Tract: Adrenal glands are unremarkable. Kidneys are normal, without renal calculi, focal lesion, or hydronephrosis. There is a small amount of air in the bladder.  Bladder otherwise appears within normal limits. Stomach/Bowel: Oral contrast reaches the rectum. Jejunal loops are dilated with air-fluid levels measuring up to 4.5 cm in diameter. There is some questionable pneumatosis involving these bowel loops with mild wall thickening. No abrupt transition point visualized. Distal small bowel loops are nondilated. Additionally, there is some wall thickening of small bowel loops in the pelvis. The colon is nondilated. Appendix is not visualized. There is sigmoid colon diverticulosis. There is also some wall thickening of the sigmoid colon. Vascular/Lymphatic: No significant vascular findings are present. No enlarged abdominal or pelvic lymph nodes. Reproductive: Uterus and bilateral adnexa are unremarkable. Other: There is a moderate amount of free air likely related to recent surgery. There is an enhancing fluid collection with air-fluid level in the cul-de-sac measuring 4.6 x 4.7 x 9.2 cm. There is also an air-fluid collection in the anterior right pelvis image 4/69 measuring 4.4 x 5.6 by 4.1 cm. This collection abuts the cecum. Patient is status post right inguinal hernia repair. There is an air-fluid collection in the right inguinal region measuring 3.9 x 2.3 by 4.5 cm without enhancement. There is also small amount of air in the left inguinal region. Heterogeneous fat is seen at the origin of the right inguinal canal, likely postsurgical. Musculoskeletal: There are chronic appearing compression deformities of T12, L1 and L3. IMPRESSION: 1. Status post right inguinal hernia repair. There is an air-fluid collection without enhancement in the right inguinal region measuring 3.9 x 2.3 x 4.5 cm which may represent postoperative seroma or resolving hematoma. Abscess is less likely, but not excluded. 2. There are 2 enhancing air-fluid collections in the pelvic cul-de-sac and right pelvis worrisome for abscesses. 3. Moderate free air in the abdomen, likely postsurgical. 4. Dilated  jejunal loops with wall thickening and possible pneumatosis. Bowel ischemia not excluded. 5. Additional wall thickening of sigmoid colon and pelvic small bowel loops, likely reactive enteritis/colitis secondary to infection. 6. Small amount of air in the bladder, likely iatrogenic. 7. Bilateral lower lobe and right middle lobe airspace disease with air bronchograms compatible with multifocal pneumonia. Electronically Signed: By: Ronney Asters M.D. On: 12/23/2021 16:12  ? ?CT IMAGE GUIDED DRAINAGE BY PERCUTANEOUS CATHETER ? ?Result Date: 12/24/2021 ?INDICATION: Briefly, 66 year old female with incarcerated inguinal hernia repair and laparotomy, with postoperative pelvic abscess. EXAM: CT GUIDED PERCUTANEOUS DRAINAGE CATHETER PLACEMENT via a LEFT TRANS GLUTEAL APPROACH RADIATION DOSE REDUCTION: This exam was performed according to the departmental dose-optimization program which includes automated exposure control, adjustment of the mA and/or kV according to patient size and/or use of iterative reconstruction technique. COMPARISON:  CT AP, 12/23/2021. MEDICATIONS: The patient is currently admitted to the hospital and receiving intravenous antibiotics. The antibiotics were administered within an appropriate time frame prior to the initiation of the procedure. ANESTHESIA/SEDATION: Moderate (conscious) sedation was employed during this procedure. A total of Versed 2 mg and Fentanyl 100 mcg was administered intravenously. Moderate Sedation Time: 19 minutes. The patient's level of consciousness and vital signs were monitored continuously by radiology nursing throughout the procedure under my direct supervision. CONTRAST:  None COMPLICATIONS: None  immediate. PROCEDURE: Informed written consent was obtained from the patient after a discussion of the risks, benefits and alternatives to treatment. The patient was placed prone on the CT gantry and a pre procedural CT was performed re-demonstrating the known abscess/fluid  collection within the dependent pelvis. The procedure was planned. A timeout was performed prior to the initiation of the procedure. The LEFT gluteus was prepped and draped in the usual sterile fashion. The overlying soft

## 2021-12-26 NOTE — Discharge Summary (Signed)
Physician Discharge Summary  ?Cynthia Morrison JJK:[id number] DOB: 01/13/1956 DOA: 12/17/2021 ? ?PCP: Hillery Aldo, PA-C ? ?Admit date: 12/17/2021 ?Discharge date: 12/26/2021 ? ?Admitted From: Home ?Disposition:  Home ? ?Discharge Condition:Stable ?CODE STATUS:FULL ?Diet recommendation: Heart Healthy  ? ?Brief/Interim Summary: ? ?Patient is a 66 year old female with history of hypertension, depression, anxiety who presented here with complaints of nausea, vomiting, right groin pain with lump.  On presentation, she was found to be in acute renal failure with granular cast, found to have incarcerated/strangulated right femoral hernia for which she was taken emergently for laparoscopic right femoral hernia repair with mesh and required resection of ischemic small bowel from midline incision.  Renal function gradually improved.  Hospital course remarkable for leukocytosis, tachycardia.  Follow-up CT scan showed pelvic fluid collection consistent with abscess, bibasilar pulmonary infiltrates.  Started on meropenem, IR consulted and she underwent drain placement for pelvic abscesses.  General surgery were following.  General surgery cleared for discharge.  Antibiotics changed to oral.  Medically stable for discharge home today.  She will follow-up with radiology as an outpatient to remove the drain. ? ?Following problems were addressed during hospitalization: ? ?Strangulated right femoral hernia: Presented with nausea, vomiting, right groin pain with lump.  Found to have a strangulated right femoral hernia.  Underwent emergent hernia repair with mesh, small bowel resection on 3/28.  NG tube out, bowels functioning.  Tolerating solid diet ? ?Intra-abdominal abscess: Patient was having leukocytosis, tachycardia during this hospitalization.  Follow-up CT scan showed pelvic fluid collection consistent with abscess.  IR consulted and she underwent drain placement via left transgluteal approach for pelvic abscess on 4/4.  IR  aware following.  Leukocytosis improving.  Aerobic/anaerobic cultures showed Enterococcus faecalis/abundant yeast.  She will continue Augmentin and fluconazole for 10 days ?  ?AKI: Secondary to strangulated hernia.  Ultrasound of the kidneys did not show any obstructive uropathy or chronic renal disease.  AKI resolved with IV fluids ?  ?Bilateral pneumonia: CT follow-up showed air bronchograms, bilateral basilar infiltrates.  Currently she is on room air.  Continue current antibiotics.  MRSA PCR negative ?  ?Hyponatremia: Mild.   ?  ?Hypokalemia: Supplemented with potassium.  ?  ?Hypertension: Continue home medications on discharge  ? ? ? ? ?Discharge Diagnoses:  ?Principal Problem: ?  Incarcerated femoral hernia ?Active Problems: ?  AKI (acute kidney injury) (HCC) ?  Intra-abdominal abscess (HCC) ?  Acute renal failure (ARF) (HCC) ?  Bilateral pneumonia ? ? ? ?Discharge Instructions ? ?Discharge Instructions   ? ? Diet - low sodium heart healthy   Complete by: As directed ?  ? Discharge instructions   Complete by: As directed ?  ? 1)Please take prescribed medications as instructed ?2)Follow up with your PCP in a week.  Do a CBC, BMP test during the follow-up ?3)You will be called by intervention radiology for the follow-up appointment for the removal of the drain  ? Increase activity slowly   Complete by: As directed ?  ? No wound care   Complete by: As directed ?  ? ?  ? ?Allergies as of 12/26/2021   ? ?   Reactions  ? Buspirone Hcl Other (See Comments)  ? hallucinations/ vomitting  ? Codeine Other (See Comments)  ? Nightmares   ? Sertraline Hcl Other (See Comments)  ? GI upset/Vomiting  ? Aleve [naproxen Sodium] Rash  ? Takes Ibuprofen with no issues  ? Sulfamethoxazole-trimethoprim Rash  ? ?  ? ?  ?Medication List  ?  ? ?  STOP taking these medications   ? ?cephALEXin 500 MG capsule ?Commonly known as: KEFLEX ?  ? ?  ? ?TAKE these medications   ? ?amLODipine 5 MG tablet ?Commonly known as: NORVASC ?Take 1 tablet (5  mg total) by mouth daily. ?  ?amoxicillin-clavulanate 875-125 MG tablet ?Commonly known as: Augmentin ?Take 1 tablet by mouth 2 (two) times daily for 10 days. ?  ?docusate sodium 100 MG capsule ?Commonly known as: COLACE ?Take 1 capsule (100 mg total) by mouth every 12 (twelve) hours. ?  ?fluconazole 100 MG tablet ?Commonly known as: Diflucan ?Take 1 tablet (100 mg total) by mouth daily for 10 days. ?  ?ibuprofen 200 MG tablet ?Commonly known as: ADVIL ?Take 400 mg by mouth every 6 (six) hours as needed for mild pain. ?  ?losartan 50 MG tablet ?Commonly known as: COZAAR ?Take 50 mg by mouth 2 (two) times daily. ?  ?Normal Saline Flush 0.9 % Soln ?Use 5 mLs by Intracatheter route daily for 14 days ?  ?ondansetron 4 MG disintegrating tablet ?Commonly known as: ZOFRAN-ODT ?Take 1 tablet (4 mg total) by mouth every 8 (eight) hours as needed for nausea or vomiting. ?  ?polyethylene glycol 17 g packet ?Commonly known as: MiraLax ?Take 17 g by mouth daily. ?  ? ?  ? ? Follow-up Information   ? ? Axel Filler, MD. Go on 01/10/2022.   ?Specialty: General Surgery ?Why: at 11:40 AM for post-operative follow up. please arrive by 11:20 AM. ?Contact information: ?[address] ?[address] ?[place] [address] ?[phone] ? ? ?  ?  ? ? Surgery, [place]. Go on 12/31/2021.   ?Specialty: General Surgery ?Why: at 9:30 AM for staple removal. please arrivr 20-30 mintues early. ?Contact information: ?[address] ?[address] ?[place] [address] ?[phone] ? ? ?  ?  ? ? Roanna Banning, MD Follow up.   ?Specialties: Interventional Radiology, Diagnostic Radiology, Radiology ?Why: Drain follow up in 10-14 days after discharge. Our schedulers will call you to set up the appointment. ?Contact information: ?[address] ?[address] ?[place] [address] ?[phone] ? ? ?  ?  ? ?  ?  ? ?  ? ?Allergies  ?Allergen Reactions  ? Buspirone Hcl Other (See Comments)  ?  hallucinations/ vomitting  ? Codeine Other (See Comments)  ?   Nightmares   ? Sertraline Hcl Other (See Comments)  ?  GI upset/Vomiting  ? Aleve [Naproxen Sodium] Rash  ?  Takes Ibuprofen with no issues  ? Sulfamethoxazole-Trimethoprim Rash  ? ? ?Consultations: ?Surgery, IR ? ? ?Procedures/Studies: ?CT ABDOMEN PELVIS W CONTRAST ? ?Addendum Date: 12/23/2021   ?ADDENDUM REPORT: 12/23/2021 16:16 ADDENDUM: These results were called by telephone at the time of interpretation on 12/23/2021 at 4:15 pm to provider [facility] , who verbally acknowledged these results. Electronically Signed   By: Darliss Cheney M.D.   On: 12/23/2021 16:16  ? ?Result Date: 12/23/2021 ?CLINICAL DATA:  Postoperative abdominal pain. EXAM: CT ABDOMEN AND PELVIS WITH CONTRAST TECHNIQUE: Multidetector CT imaging of the abdomen and pelvis was performed using the standard protocol following bolus administration of intravenous contrast. RADIATION DOSE REDUCTION: This exam was performed according to the departmental dose-optimization program which includes automated exposure control, adjustment of the mA and/or kV according to patient size and/or use of iterative reconstruction technique. CONTRAST:  OMNIPAQUE IOHEXOL 300 MG/ML  SOLN COMPARISON:  None. FINDINGS: Lower chest: There is patchy airspace disease in the bilateral lower lobes and  minimally in the right middle lobe with air bronchograms. Hepatobiliary: There is a hypodensity in the posterior right lobe of the liver which is too small to characterize, likely a small cyst or hemangioma. Liver, gallbladder and bile ducts are otherwise within normal limits. Pancreas: Unremarkable. No pancreatic ductal dilatation or surrounding inflammatory changes. Spleen: Normal in size without focal abnormality. Adrenals/Urinary Tract: Adrenal glands are unremarkable. Kidneys are normal, without renal calculi, focal lesion, or hydronephrosis. There is a small amount of air in the bladder. Bladder otherwise appears within normal limits. Stomach/Bowel: Oral contrast  reaches the rectum. Jejunal loops are dilated with air-fluid levels measuring up to 4.5 cm in diameter. There is some questionable pneumatosis involving these bowel loops with mild wall thickening. No abrupt

## 2021-12-26 NOTE — Progress Notes (Signed)
9 Days Post-Op  ? ?Subjective/Chief Complaint: ?NAEO. Pain controlled, tolerating PO, having bowel function, mobilizing. ? ?WBC 14 from 24 ? ?Objective: ?Vital signs in last 24 hours: ?Temp:  [97.5 ?F (36.4 ?C)-99.1 ?F (37.3 ?C)] 98.5 ?F (36.9 ?C) (04/06 0700) ?Pulse Rate:  [86-98] 98 (04/06 0700) ?Resp:  [17-20] 20 (04/06 0700) ?BP: (148-164)/(83-93) 162/93 (04/06 0700) ?SpO2:  [97 %-99 %] 99 % (04/06 0700) ?Last BM Date : 12/24/21 ? ?Intake/Output from previous day: ?04/05 0701 - 04/06 0700 ?In: 2150 [P.O.:1200; I.V.:645; IV Piggyback:300] ?Out: 550 [Urine:550] ?Intake/Output this shift: ?No intake/output data recorded. ? ?General appearance: alert and cooperative ?Incision/Wound: Incision is clean dry and intact under honeycomb. Mild abd distention, no peritonitis.  ?Transgluteal drain recently emptied - ?Lab Results:  ?Recent Labs  ?  12/25/21 ?0143 12/26/21 ?0115  ?WBC 14.6* 15.5*  ?HGB 12.7 13.5  ?HCT 38.0 39.2  ?PLT 130* 167  ? ?BMET ?Recent Labs  ?  12/25/21 ?0143 12/26/21 ?0115  ?NA 132* 132*  ?K 3.4* 4.1  ?CL 101 100  ?CO2 24 22  ?GLUCOSE 92 84  ?BUN 10 7*  ?CREATININE 0.86 0.86  ?CALCIUM 7.5* 7.5*  ? ?PT/INR ?No results for input(s): LABPROT, INR in the last 72 hours. ?ABG ?No results for input(s): PHART, HCO3 in the last 72 hours. ? ?Invalid input(s): PCO2, PO2 ? ?Studies/Results: ?CT IMAGE GUIDED DRAINAGE BY PERCUTANEOUS CATHETER ? ?Result Date: 12/24/2021 ?INDICATION: Briefly, 66 year old female with incarcerated inguinal hernia repair and laparotomy, with postoperative pelvic abscess. EXAM: CT GUIDED PERCUTANEOUS DRAINAGE CATHETER PLACEMENT via a LEFT TRANS GLUTEAL APPROACH RADIATION DOSE REDUCTION: This exam was performed according to the departmental dose-optimization program which includes automated exposure control, adjustment of the mA and/or kV according to patient size and/or use of iterative reconstruction technique. COMPARISON:  CT AP, 12/23/2021. MEDICATIONS: The patient is currently admitted  to the hospital and receiving intravenous antibiotics. The antibiotics were administered within an appropriate time frame prior to the initiation of the procedure. ANESTHESIA/SEDATION: Moderate (conscious) sedation was employed during this procedure. A total of Versed 2 mg and Fentanyl 100 mcg was administered intravenously. Moderate Sedation Time: 19 minutes. The patient's level of consciousness and vital signs were monitored continuously by radiology nursing throughout the procedure under my direct supervision. CONTRAST:  None COMPLICATIONS: None immediate. PROCEDURE: Informed written consent was obtained from the patient after a discussion of the risks, benefits and alternatives to treatment. The patient was placed prone on the CT gantry and a pre procedural CT was performed re-demonstrating the known abscess/fluid collection within the dependent pelvis. The procedure was planned. A timeout was performed prior to the initiation of the procedure. The LEFT gluteus was prepped and draped in the usual sterile fashion. The overlying soft tissues were anesthetized with 1% lidocaine with epinephrine. Appropriate trajectory was planned with the use of a 22 gauge spinal needle. An 18 gauge trocar needle was advanced into the abscess/fluid collection and a short Amplatz super stiff wire was coiled within the collection. Appropriate positioning was confirmed with a limited CT scan. The tract was serially dilated allowing placement of a 14 Fr Skater drainage catheter. Appropriate positioning was confirmed with a limited postprocedural CT scan. 10 mL of purulent fluid was aspirated. The tube was connected to a suction bulb and sutured in place. A dressing was placed. The patient tolerated the procedure well without immediate post procedural complication. IMPRESSION: Successful CT guided placement of a 14 Fr drainage catheter into the pelvis via LEFT transgluteal approach, as  above. Samples were sent to the laboratory as  requested by the ordering clinical team. Roanna Banning, MD [facility] Electronically Signed   By: Roanna Banning M.D.   On: 12/24/2021 19:55   ? ?Anti-infectives: ?Anti-infectives (From admission, onward)  ? ? Start     Dose/Rate Route Frequency Ordered Stop  ? 12/25/21 1500  meropenem (MERREM) 1 g in sodium chloride 0.9 % 100 mL IVPB       ? 1 g ?200 mL/hr over 30 Minutes Intravenous Every 8 hours 12/25/21 0925    ? 12/24/21 2000  meropenem (MERREM) 1 g in sodium chloride 0.9 % 100 mL IVPB       ? 1 g ?200 mL/hr over 30 Minutes Intravenous  Once 12/24/21 1904 12/24/21 2115  ? 12/23/21 1800  meropenem (MERREM) 1 g in sodium chloride 0.9 % 100 mL IVPB  Status:  Discontinued       ? 1 g ?200 mL/hr over 30 Minutes Intravenous Every 12 hours 12/23/21 1642 12/25/21 0925  ? 12/18/21 0800  cefoTEtan (CEFOTAN) 2 g in sodium chloride 0.9 % 100 mL IVPB       ? 2 g ?200 mL/hr over 30 Minutes Intravenous  Once 12/17/21 2132 12/18/21 0852  ? 12/17/21 2115  metroNIDAZOLE (FLAGYL) IVPB 500 mg       ? 500 mg ?100 mL/hr over 60 Minutes Intravenous  Once 12/17/21 2105 12/18/21 0019  ? 12/17/21 1330  cefTRIAXone (ROCEPHIN) 2 g in sodium chloride 0.9 % 100 mL IVPB       ? 2 g ?200 mL/hr over 30 Minutes Intravenous Every 24 hours 12/17/21 1326 12/23/21 1511  ? ?  ? ? ?Assessment/Plan: ?s/p Procedure(s): ?LAPAROSCOPIC RIGHT FEMORAL HERNIA REPAIR WITH MESH (Right) ?EXPLORATORY LAPAROTOMY SMALL BOWEL RESECTION WITH ANASTOMOSIS (Right) ?POD#9 ?- Pain controlled, clinically improving and having bowel function, tolerating PO. ?- CT A/P 4/3 w/ intra-abd fluid collections concerning for abscesses, likely seroma R groin, also w/ PNA ?- - WBC 15 from 14 ?s/p IR drain yesterday 4/4. Follow Cx ?- stable for discharge from [facility] perspcetive. Clinically she is significantly improved. Drain working. Would recommend 10-14 days PO abx. Follow up provided. Patient has not been taking narcotic medicine  so none was prescribed. ? ? ? LOS: 9 days  ? ? ?Francine Graven Josephmichael Lisenbee ?12/26/2021 ? ?

## 2021-12-26 NOTE — Progress Notes (Signed)
Mobility Specialist Progress Note: ? ? 12/26/21 1147  ?Mobility  ?Activity Ambulated with assistance in hallway  ?Level of Assistance Independent  ?Assistive Device  ?(HHA)  ?Distance Ambulated (ft) 570 ft  ?Activity Response Tolerated well  ?$Mobility charge 1 Mobility  ? ?Pt received in bed willing to participate in mobility. No complaints of pain. Left in bed with call bell in reach and all needs met.  ? ?Tiannah Greenly ?Mobility Specialist ?Primary Phone [phone]' ?

## 2021-12-27 ENCOUNTER — Other Ambulatory Visit: Payer: Self-pay | Admitting: General Surgery

## 2021-12-27 DIAGNOSIS — K651 Peritoneal abscess: Secondary | ICD-10-CM

## 2021-12-28 LAB — AEROBIC/ANAEROBIC CULTURE W GRAM STAIN (SURGICAL/DEEP WOUND)

## 2022-01-04 ENCOUNTER — Inpatient Hospital Stay (HOSPITAL_COMMUNITY)
Admission: EM | Admit: 2022-01-04 | Discharge: 2022-01-09 | DRG: 862 | Disposition: A | Discharge status: Home Health | Payer: BC Managed Care – PPO | Attending: General Surgery | Admitting: General Surgery

## 2022-01-04 ENCOUNTER — Emergency Department (HOSPITAL_COMMUNITY): Payer: BC Managed Care – PPO

## 2022-01-04 ENCOUNTER — Encounter (HOSPITAL_COMMUNITY): Payer: Self-pay | Admitting: Emergency Medicine

## 2022-01-04 ENCOUNTER — Other Ambulatory Visit: Payer: Self-pay

## 2022-01-04 DIAGNOSIS — Y929 Unspecified place or not applicable: Secondary | ICD-10-CM

## 2022-01-04 DIAGNOSIS — Z885 Allergy status to narcotic agent status: Secondary | ICD-10-CM | POA: Diagnosis not present

## 2022-01-04 DIAGNOSIS — Z888 Allergy status to other drugs, medicaments and biological substances status: Secondary | ICD-10-CM

## 2022-01-04 DIAGNOSIS — T8143XA Infection following a procedure, organ and space surgical site, initial encounter: Principal | ICD-10-CM | POA: Diagnosis present

## 2022-01-04 DIAGNOSIS — Z87891 Personal history of nicotine dependence: Secondary | ICD-10-CM

## 2022-01-04 DIAGNOSIS — K8020 Calculus of gallbladder without cholecystitis without obstruction: Secondary | ICD-10-CM | POA: Diagnosis not present

## 2022-01-04 DIAGNOSIS — A419 Sepsis, unspecified organism: Secondary | ICD-10-CM | POA: Diagnosis not present

## 2022-01-04 DIAGNOSIS — I10 Essential (primary) hypertension: Secondary | ICD-10-CM | POA: Diagnosis present

## 2022-01-04 DIAGNOSIS — Z79899 Other long term (current) drug therapy: Secondary | ICD-10-CM | POA: Diagnosis not present

## 2022-01-04 DIAGNOSIS — Y839 Surgical procedure, unspecified as the cause of abnormal reaction of the patient, or of later complication, without mention of misadventure at the time of the procedure: Secondary | ICD-10-CM | POA: Diagnosis present

## 2022-01-04 DIAGNOSIS — B9620 Unspecified Escherichia coli [E. coli] as the cause of diseases classified elsewhere: Secondary | ICD-10-CM | POA: Diagnosis present

## 2022-01-04 DIAGNOSIS — Z8249 Family history of ischemic heart disease and other diseases of the circulatory system: Secondary | ICD-10-CM

## 2022-01-04 DIAGNOSIS — Z882 Allergy status to sulfonamides status: Secondary | ICD-10-CM | POA: Diagnosis not present

## 2022-01-04 DIAGNOSIS — K651 Peritoneal abscess: Secondary | ICD-10-CM | POA: Diagnosis present

## 2022-01-04 DIAGNOSIS — R42 Dizziness and giddiness: Secondary | ICD-10-CM | POA: Diagnosis not present

## 2022-01-04 DIAGNOSIS — K6811 Postprocedural retroperitoneal abscess: Secondary | ICD-10-CM | POA: Diagnosis not present

## 2022-01-04 DIAGNOSIS — R918 Other nonspecific abnormal finding of lung field: Secondary | ICD-10-CM | POA: Diagnosis not present

## 2022-01-04 DIAGNOSIS — K5730 Diverticulosis of large intestine without perforation or abscess without bleeding: Secondary | ICD-10-CM | POA: Diagnosis not present

## 2022-01-04 DIAGNOSIS — L02214 Cutaneous abscess of groin: Secondary | ICD-10-CM | POA: Diagnosis not present

## 2022-01-04 LAB — CBC WITH DIFFERENTIAL/PLATELET
Abs Immature Granulocytes: 0.05 10*3/uL (ref 0.00–0.07)
Basophils Absolute: 0 10*3/uL (ref 0.0–0.1)
Basophils Relative: 0 %
Eosinophils Absolute: 0 10*3/uL (ref 0.0–0.5)
Eosinophils Relative: 0 %
HCT: 38.9 % (ref 36.0–46.0)
Hemoglobin: 13.4 g/dL (ref 12.0–15.0)
Immature Granulocytes: 1 %
Lymphocytes Relative: 9 %
Lymphs Abs: 0.8 10*3/uL (ref 0.7–4.0)
MCH: 33.3 pg (ref 26.0–34.0)
MCHC: 34.4 g/dL (ref 30.0–36.0)
MCV: 96.8 fL (ref 80.0–100.0)
Monocytes Absolute: 0.4 10*3/uL (ref 0.1–1.0)
Monocytes Relative: 4 %
Neutro Abs: 7.8 10*3/uL — ABNORMAL HIGH (ref 1.7–7.7)
Neutrophils Relative %: 86 %
Platelets: 346 10*3/uL (ref 150–400)
RBC: 4.02 MIL/uL (ref 3.87–5.11)
RDW: 13.5 % (ref 11.5–15.5)
WBC: 9.2 10*3/uL (ref 4.0–10.5)
nRBC: 0 % (ref 0.0–0.2)

## 2022-01-04 LAB — COMPREHENSIVE METABOLIC PANEL
ALT: 10 U/L (ref 0–44)
AST: 20 U/L (ref 15–41)
Albumin: 2.1 g/dL — ABNORMAL LOW (ref 3.5–5.0)
Alkaline Phosphatase: 59 U/L (ref 38–126)
Anion gap: 12 (ref 5–15)
BUN: 6 mg/dL — ABNORMAL LOW (ref 8–23)
CO2: 23 mmol/L (ref 22–32)
Calcium: 7.9 mg/dL — ABNORMAL LOW (ref 8.9–10.3)
Chloride: 94 mmol/L — ABNORMAL LOW (ref 98–111)
Creatinine, Ser: 0.8 mg/dL (ref 0.44–1.00)
GFR, Estimated: >60 mL/min (ref >60)
Glucose, Bld: 109 mg/dL — ABNORMAL HIGH (ref 70–99)
Potassium: 2.7 mmol/L — CL (ref 3.5–5.1)
Sodium: 129 mmol/L — ABNORMAL LOW (ref 135–145)
Total Bilirubin: 0.6 mg/dL (ref 0.3–1.2)
Total Protein: 6.3 g/dL — ABNORMAL LOW (ref 6.5–8.1)

## 2022-01-04 LAB — LACTIC ACID, PLASMA: Lactic Acid, Venous: 1.4 mmol/L (ref 0.5–1.9)

## 2022-01-04 LAB — PROTIME-INR
INR: 1.2 (ref 0.8–1.2)
Prothrombin Time: 15 seconds (ref 11.4–15.2)

## 2022-01-04 MED ORDER — LIDOCAINE HCL (PF) 1 % IJ SOLN
INTRAMUSCULAR | Status: AC
  Filled 2022-01-04: qty 5

## 2022-01-04 MED ORDER — IOHEXOL 300 MG/ML  SOLN
100.0000 mL | Freq: Once | INTRAMUSCULAR | Status: AC | PRN
  Administered 2022-01-04: 100 mL via INTRAVENOUS

## 2022-01-04 MED ORDER — VANCOMYCIN HCL IN DEXTROSE 1-5 GM/200ML-% IV SOLN
1000.0000 mg | Freq: Once | INTRAVENOUS | Status: AC
  Administered 2022-01-04: 1000 mg via INTRAVENOUS
  Filled 2022-01-04: qty 200

## 2022-01-04 MED ORDER — LACTATED RINGERS IV BOLUS
1000.0000 mL | Freq: Once | INTRAVENOUS | Status: AC
  Administered 2022-01-04: 1000 mL via INTRAVENOUS

## 2022-01-04 MED ORDER — POTASSIUM CHLORIDE 20 MEQ PO PACK
40.0000 meq | PACK | Freq: Once | ORAL | Status: AC
  Administered 2022-01-05: 40 meq via ORAL
  Filled 2022-01-04: qty 2

## 2022-01-04 MED ORDER — VANCOMYCIN HCL IN DEXTROSE 1-5 GM/200ML-% IV SOLN
1000.0000 mg | INTRAVENOUS | Status: DC
  Administered 2022-01-05 – 2022-01-07 (×3): 1000 mg via INTRAVENOUS
  Filled 2022-01-04 (×3): qty 200

## 2022-01-04 MED ORDER — ACETAMINOPHEN 10 MG/ML IV SOLN
1000.0000 mg | Freq: Four times a day (QID) | INTRAVENOUS | Status: AC
  Administered 2022-01-04 – 2022-01-05 (×2): 1000 mg via INTRAVENOUS
  Filled 2022-01-04 (×4): qty 100

## 2022-01-04 MED ORDER — TRAMADOL HCL 50 MG PO TABS
50.0000 mg | ORAL_TABLET | Freq: Four times a day (QID) | ORAL | Status: DC | PRN
  Administered 2022-01-06 – 2022-01-08 (×5): 50 mg via ORAL
  Filled 2022-01-04 (×5): qty 1

## 2022-01-04 MED ORDER — MORPHINE SULFATE (PF) 2 MG/ML IV SOLN
2.0000 mg | INTRAVENOUS | Status: DC | PRN
  Administered 2022-01-05 – 2022-01-07 (×5): 2 mg via INTRAVENOUS
  Filled 2022-01-04 (×5): qty 1

## 2022-01-04 MED ORDER — SODIUM CHLORIDE 0.9 % IV SOLN
1.0000 g | Freq: Three times a day (TID) | INTRAVENOUS | Status: DC
  Administered 2022-01-04 – 2022-01-09 (×15): 1 g via INTRAVENOUS
  Filled 2022-01-04 (×17): qty 20

## 2022-01-04 MED ORDER — LACTATED RINGERS IV BOLUS (SEPSIS)
1000.0000 mL | Freq: Once | INTRAVENOUS | Status: AC
  Administered 2022-01-04: 1000 mL via INTRAVENOUS

## 2022-01-04 MED ORDER — POTASSIUM CHLORIDE IN NACL 20-0.9 MEQ/L-% IV SOLN
INTRAVENOUS | Status: DC
  Filled 2022-01-04 (×5): qty 1000

## 2022-01-04 MED ORDER — ENOXAPARIN SODIUM 40 MG/0.4ML IJ SOSY
40.0000 mg | PREFILLED_SYRINGE | INTRAMUSCULAR | Status: DC
  Administered 2022-01-05 – 2022-01-06 (×2): 40 mg via SUBCUTANEOUS
  Filled 2022-01-04 (×2): qty 0.4

## 2022-01-04 MED ORDER — POTASSIUM CHLORIDE 10 MEQ/100ML IV SOLN
10.0000 meq | INTRAVENOUS | Status: AC
  Administered 2022-01-04 – 2022-01-05 (×3): 10 meq via INTRAVENOUS
  Filled 2022-01-04 (×3): qty 100

## 2022-01-04 NOTE — Progress Notes (Signed)
Pharmacy Antibiotic Note ? ?[name] is a 66 y.o. female admitted on 01/04/2022 with  abdominal abscess .  Pharmacy has been consulted for Meropenem and vancomycin dosing. ? ?SCr wnl, WBC wnl  ? ?Plan: ?-Meropenem 1 gm IV Q 8 hours ?-Vancomycin 1000 mg IV Q 24 hrs. Goal AUC 400-550. ?Expected AUC: 533 ?SCr used: 0.8 ?-Monitor CBC, renal fx, cultures and clinical progress ?-Vanc levels at steady state  ? ? ?  ? ?Temp (24hrs), Avg:101.9 ?F (38.8 ?C), Min:101.9 ?F (38.8 ?C), Max:101.9 ?F (38.8 ?C) ? ?No results for input(s): WBC, CREATININE, LATICACIDVEN, VANCOTROUGH, VANCOPEAK, VANCORANDOM, GENTTROUGH, GENTPEAK, GENTRANDOM, TOBRATROUGH, TOBRAPEAK, TOBRARND, AMIKACINPEAK, AMIKACINTROU, AMIKACIN in the last 168 hours.  ?Estimated Creatinine Clearance: 52.5 mL/min (by C-G formula based on SCr of 0.86 mg/dL).   ? ?Allergies  ?Allergen Reactions  ? Buspirone Hcl Other (See Comments)  ?  hallucinations/ vomitting  ? Codeine Other (See Comments)  ?  Nightmares   ? Sertraline Hcl Other (See Comments)  ?  GI upset/Vomiting  ? Aleve [Naproxen Sodium] Rash  ?  Takes Ibuprofen with no issues  ? Sulfamethoxazole-Trimethoprim Rash  ? ? ?Antimicrobials this admission: ?Meropenem 4/15 >>  ?Vancomycin 4/15 >>  ? ?Dose adjustments this admission: ? ? ?Microbiology results: ?4/15 BCx:  ? ? ?Thank you for allowing pharmacy to be a part of this patientCynthias care. ? ?Vinnie Level, PharmD., BCCCP ?Clinical Pharmacist ?Please refer to AMION for unit-specific pharmacist  ? ? ?

## 2022-01-04 NOTE — ED Provider Triage Note (Signed)
Emergency Medicine Provider Triage Evaluation Note ? ?Cynthia Morrison , a 66 y.o. female  was evaluated in triage.  Pt complains of pain in the lower abdomen around where she had a strangulated right femoral hernia as well as an intra-abdominal abscess.  Abscess was drained and IR placed catheter which her daughter has been managing at home.  Patient was discharged on 4/6. ? ?Patient and daughter are here with concern that there is another hernia near the previously repaired hernia. ? ?Review of Systems  ?Positive: Abdominal pain and hernia ?Negative: Chills ? ?Physical Exam  ?BP 122/85 (BP Location: Left Arm)   Pulse (!) 132   Temp (!) 101.9 ?F (38.8 ?C) (Oral)   Resp 20   SpO2 97%  ?Gen:   Awake, no distress   ?Resp:  Normal effort  ?MSK:   Moves extremities without difficulty  ?Other:  Drain with purulent fluid, purulent fluid noted around the drain site in the left lower back.  Right lower groin hernia, nontender to palpation.  Surgical site appears well-healed. ? ?Medical Decision Making  ?Medically screening exam initiated at 4:02 PM.  Appropriate orders placed.  LAYELLE VANALSTINE was informed that the remainder of the evaluation will be completed by another provider, this initial triage assessment does not replace that evaluation, and the importance of remaining in the ED until their evaluation is complete. ? ? ? ?Tachycardic with fever, meets sepsis criteria, source likely being previous surgical site/abscess ?  ?Rhae Hammock, PA-C ?01/04/22 1607 ? ?

## 2022-01-04 NOTE — Consult Note (Signed)
? ?Cynthia Morrison ?08/22/1956  ?[id number].   ? ?Requesting MD: Dr. Jeanell Sparrow ?Chief Complaint/Reason for Consult: fevers, possible recurrent femoral hernia ? ?HPI:  ?Cynthia Morrison is a 66 yo female who recently underwent repair of an incarcerated right femoral hernia with small bowel resection on 3/28 by Dr. Rosendo Gros. The hernia was repaired via a extraperitoneal approach with mesh, and on intraperitoneal examination of the bowel an ischemic segment was noted. The bowel resection was then performed via a laparotomy incision. The patient had a postoperative ileus and pelvic abscess, which was percutaneously drained (this drain remains in place). Imaging at that time also showed a fluid collection in the right groin. The patient was discharged 4/6 at which time she was eating. ? ?She had her staples removed on 4/11 and reports that 2 days later she noticed swelling in her right groin. She continued to have regular bowel function. Today she noticed more pain in the groin and presented to the ED. She says she has been having regular bowel movements at home and eating without difficulty, and she denies nausea and vomiting.  She denies fevers at home, but has been febrile in the ED.  An abdominal CT scan and labs are pending. ? ?ROS: ?Review of Systems  ?Constitutional:  Positive for fever. Negative for chills.  ?Respiratory:  Negative for shortness of breath.   ?Cardiovascular:  Negative for chest pain.  ?Gastrointestinal:  Positive for abdominal pain. Negative for constipation, nausea and vomiting.  ? ?Family History  ?Problem Relation Age of Onset  ? Hypertension Mother   ? Heart failure Father   ? Pancreatic cancer Father   ? ? ?Past Medical History:  ?Diagnosis Date  ? Hypertension   ? ? ?Past Surgical History:  ?Procedure Laterality Date  ? BACK SURGERY    ? GANGLION CYST EXCISION Right   ? INGUINAL HERNIA REPAIR Right 12/17/2021  ? Procedure: LAPAROSCOPIC RIGHT FEMORAL HERNIA REPAIR WITH MESH;  Surgeon: Ralene Ok, MD;   Location: [facility];  Service: General;  Laterality: Right;  ? INGUINAL HERNIA REPAIR Right 12/17/2021  ? Procedure: EXPLORATORY LAPAROTOMY SMALL BOWEL RESECTION WITH ANASTOMOSIS;  Surgeon: Ralene Ok, MD;  Location: [facility];  Service: General;  Laterality: Right;  ? WISDOM TOOTH EXTRACTION    ? ? ?Social History:  reports that she has quit smoking. She has never used smokeless tobacco. She reports current alcohol use. She reports that she does not use drugs. ? ?Allergies:  ?Allergies  ?Allergen Reactions  ? Buspirone Hcl Other (See Comments)  ?  hallucinations/ vomitting  ? Codeine Other (See Comments)  ?  Nightmares   ? Sertraline Hcl Other (See Comments)  ?  GI upset/Vomiting  ? Aleve [Naproxen Sodium] Rash  ?  Takes Ibuprofen with no issues  ? Sulfamethoxazole-Trimethoprim Rash  ? ? ?(Not in a hospital admission) ? ? ? ?Physical Exam: ?Blood pressure 120/70, pulse 100, temperature (!) 101.9 ?F (38.8 ?C), temperature source Oral, resp. rate (!) 31, SpO2 97 %. ?General: resting comfortably, appears stated age, no apparent distress ?Neurological: alert and oriented, no focal deficits, cranial nerves grossly in tact ?HEENT: normocephalic, atraumatic ?CV: RRR ?Respiratory: normal work of breathing ?Abdomen: soft, nondistended, nontender to palpation.  Midline incision is clean and dry with no erythema or induration.  There is a fluctuant mass in the right medial groin, inferior to the inguinal ligament with mild overlying erythema but no significant induration and no tenderness to palpation.  Pelvic drain with purulent fluid in the bulb. ?  Extremities: warm and well-perfused, no deformities, moving all extremities spontaneously ?Psychiatric: normal mood and affect ?Skin: warm and dry, no jaundice, no rashes or lesions ? ? ?Results for orders placed or performed during the hospital encounter of 01/04/22 (from the past 48 hour(s))  ?Lactic acid, plasma     Status: None  ? Collection Time: 01/04/22  4:01 PM  ?Result Value  Ref Range  ? Lactic Acid, Venous 1.4 0.5 - 1.9 mmol/L  ?  Comment: Performed at [facility], [place] [address] [place], [place] [address]  ?Comprehensive metabolic panel     Status: Abnormal  ? Collection Time: 01/04/22  6:08 PM  ?Result Value Ref Range  ? Sodium 129 (L) 135 - 145 mmol/L  ? Potassium 2.7 (LL) 3.5 - 5.1 mmol/L  ?  Comment: CRITICAL RESULT CALLED TO, READ BACK BY AND VERIFIED WITH: ?A.BRITT,RN 01/04/2022 AT 1859 A.HUGHES ?  ? Chloride 94 (L) 98 - 111 mmol/L  ? CO2 23 22 - 32 mmol/L  ? Glucose, Bld 109 (H) 70 - 99 mg/dL  ?  Comment: Glucose reference range applies only to samples taken after fasting for at least 8 hours.  ? BUN 6 (L) 8 - 23 mg/dL  ? Creatinine, Ser 0.80 0.44 - 1.00 mg/dL  ? Calcium 7.9 (L) 8.9 - 10.3 mg/dL  ? Total Protein 6.3 (L) 6.5 - 8.1 g/dL  ? Albumin 2.1 (L) 3.5 - 5.0 g/dL  ? AST 20 15 - 41 U/L  ? ALT 10 0 - 44 U/L  ? Alkaline Phosphatase 59 38 - 126 U/L  ? Total Bilirubin 0.6 0.3 - 1.2 mg/dL  ? GFR, Estimated >60 >60 mL/min  ?  Comment: (NOTE) ?Calculated using the CKD-EPI Creatinine Equation (2021) ?  ? Anion gap 12 5 - 15  ?  Comment: Performed at [facility], [place] [address] [place], [place] [address]  ?CBC with Differential     Status: Abnormal  ? Collection Time: 01/04/22  6:08 PM  ?Result Value Ref Range  ? WBC 9.2 4.0 - 10.5 K/uL  ? RBC 4.02 3.87 - 5.11 MIL/uL  ? Hemoglobin 13.4 12.0 - 15.0 g/dL  ? HCT 38.9 36.0 - 46.0 %  ? MCV 96.8 80.0 - 100.0 fL  ? MCH 33.3 26.0 - 34.0 pg  ? MCHC 34.4 30.0 - 36.0 g/dL  ? RDW 13.5 11.5 - 15.5 %  ? Platelets 346 150 - 400 K/uL  ? nRBC 0.0 0.0 - 0.2 %  ? Neutrophils Relative % 86 %  ? Neutro Abs 7.8 (H) 1.7 - 7.7 K/uL  ? Lymphocytes Relative 9 %  ? Lymphs Abs 0.8 0.7 - 4.0 K/uL  ? Monocytes Relative 4 %  ? Monocytes Absolute 0.4 0.1 - 1.0 K/uL  ? Eosinophils Relative 0 %  ? Eosinophils Absolute 0.0 0.0 - 0.5 K/uL  ? Basophils Relative 0 %  ? Basophils Absolute 0.0 0.0 - 0.1 K/uL  ? Immature Granulocytes 1 %  ? Abs Immature  Granulocytes 0.05 0.00 - 0.07 K/uL  ?  Comment: Performed at [facility], [place] [address], [place], [place] [address]  ?Protime-INR     Status: None  ? Collection Time: 01/04/22  6:08 PM  ?Result Value Ref Range  ? Prothrombin Time 15.0 11.4 - 15.2 seconds  ? INR 1.2 0.8 - 1.2  ?  Comment: (NOTE) ?INR goal varies based on device and disease states. ?Performed at [facility], [place]  [address] [place], [place] ?[address] ?  ? ?DG Chest 2 View ? ?Result Date: 01/04/2022 ?CLINICAL DATA:  Sepsis EXAM: CHEST - 2 VIEW COMPARISON:  Chest x-ray 12/17/2021 and chest CT 12/23/2021 FINDINGS: Heart is mildly enlarged. Mediastinum appears stable and within normal limits. Pulmonary vasculature is normal. Irregular strandy opacities in the right lung base, possibly similar to previous CT. No pleural effusion or pneumothorax identified. Elevated right hemidiaphragm. IMPRESSION: Strandy opacities at the right lung base which may represent atelectasis or infiltrate. Electronically Signed   By: Ofilia Neas M.D.   On: 01/04/2022 16:25   ? ? ? ?Assessment/Plan ?This is a 66 year old female presenting almost 3 weeks after repair of a strangulated right femoral hernia with small bowel resection, with right groin erythema and fevers.  She has continued purulent drainage from her percutaneous drain.  The area of erythema and swelling in her groin corresponds to the location of her previous femoral hernia sac.  However, on her previous CT there was a fluid collection in this area.  I suspect that she now has an abscess and not a recurrent hernia, and she has continued to have regular bowel function and does not have significant tenderness on exam . We will follow-up her CT scan to confirm this before formulating a plan.  If this is indeed an abscess and not a recurrent hernia, she will  require drainage. ? ?Final plan pending CT results. Discussed with oncoming on call surgeon this evening. ? ?Michaelle Birks, MD ?[facility] ?General, Hepatobiliary and Pancreatic Surgery ?01/04/22 8:27 PM ? ? ?

## 2022-01-04 NOTE — H&P (Signed)
TRAEH MILROY ?1956-07-13  ?[id number].   ?  ?Requesting MD: Dr. Rosalia Hammers ?Chief Complaint/Reason for Consult: fevers, possible recurrent femoral hernia ?  ?HPI:  ?Ms. Weakland is a 66 yo female who recently underwent repair of an incarcerated right femoral hernia with small bowel resection on 3/28 by Dr. Derrell Lolling. The hernia was repaired via a extraperitoneal approach with mesh, and on intraperitoneal examination of the bowel an ischemic segment was noted. The bowel resection was then performed via a laparotomy incision. The patient had a postoperative ileus and pelvic abscess, which was percutaneously drained (this drain remains in place). Imaging at that time also showed a fluid collection in the right groin. The patient was discharged 4/6 at which time she was eating. ?  ?She had her staples removed on 4/11 and reports that 2 days later she noticed swelling in her right groin. She continued to have regular bowel function. Today she noticed more pain in the groin and presented to the ED. She says she has been having regular bowel movements at home and eating without difficulty, and she denies nausea and vomiting.  She denies fevers at home, but has been febrile in the ED.  An abdominal CT scan and labs are pending. ?  ?ROS: ?Review of Systems  ?Constitutional:  Positive for fever. Negative for chills.  ?Respiratory:  Negative for shortness of breath.   ?Cardiovascular:  Negative for chest pain.  ?Gastrointestinal:  Positive for abdominal pain. Negative for constipation, nausea and vomiting.  ?  ?     ?Family History  ?Problem Relation Age of Onset  ? Hypertension Mother    ? Heart failure Father    ? Pancreatic cancer Father    ?  ?  ?    ?Past Medical History:  ?Diagnosis Date  ? Hypertension    ?  ?  ?     ?Past Surgical History:  ?Procedure Laterality Date  ? BACK SURGERY      ? GANGLION CYST EXCISION Right    ? INGUINAL HERNIA REPAIR Right 12/17/2021  ?  Procedure: LAPAROSCOPIC RIGHT FEMORAL HERNIA REPAIR WITH MESH;   Surgeon: Axel Filler, MD;  Location: [facility] OR;  Service: General;  Laterality: Right;  ? INGUINAL HERNIA REPAIR Right 12/17/2021  ?  Procedure: EXPLORATORY LAPAROTOMY SMALL BOWEL RESECTION WITH ANASTOMOSIS;  Surgeon: Axel Filler, MD;  Location: [facility] OR;  Service: General;  Laterality: Right;  ? WISDOM TOOTH EXTRACTION      ?  ?  ?Social History:  reports that she has quit smoking. She has never used smokeless tobacco. She reports current alcohol use. She reports that she does not use drugs. ?  ?Allergies:  ?     ?Allergies  ?Allergen Reactions  ? Buspirone Hcl Other (See Comments)  ?    hallucinations/ vomitting  ? Codeine Other (See Comments)  ?    Nightmares   ? Sertraline Hcl Other (See Comments)  ?    GI upset/Vomiting  ? Aleve [Naproxen Sodium] Rash  ?    Takes Ibuprofen with no issues  ? Sulfamethoxazole-Trimethoprim Rash  ?  ?  ?(Not in a hospital admission) ?  ?  ?  ?Physical Exam: ?Blood pressure 120/70, pulse 100, temperature (!) 101.9 ?F (38.8 ?C), temperature source Oral, resp. rate (!) 31, SpO2 97 %. ?General: resting comfortably, appears stated age, no apparent distress ?Neurological: alert and oriented, no focal deficits, cranial nerves grossly in tact ?HEENT: normocephalic, atraumatic ?CV: RRR ?Respiratory: normal work of breathing ?Abdomen: soft,  nondistended, nontender to palpation.  Midline incision is clean and dry with no erythema or induration.  There is a fluctuant mass in the right medial groin, inferior to the inguinal ligament with mild overlying erythema but no significant induration and no tenderness to palpation.  Pelvic drain with purulent fluid in the bulb. ?Extremities: warm and well-perfused, no deformities, moving all extremities spontaneously ?Psychiatric: normal mood and affect ?Skin: warm and dry, no jaundice, no rashes or lesions ?  ?  ?Lab Results Last 48 Hours  ?      ?Results for orders placed or performed during the hospital encounter of 01/04/22 (from the past 48  hour(s))  ?Lactic acid, plasma     Status: None  ?  Collection Time: 01/04/22  4:01 PM  ?Result Value Ref Range  ?  Lactic Acid, Venous 1.4 0.5 - 1.9 mmol/L  ?    Comment: Performed at [facility], [address], [place], [place] [address]  ?Comprehensive metabolic panel     Status: Abnormal  ?  Collection Time: 01/04/22  6:08 PM  ?Result Value Ref Range  ?  Sodium 129 (L) 135 - 145 mmol/L  ?  Potassium 2.7 (LL) 3.5 - 5.1 mmol/L  ?    Comment: CRITICAL RESULT CALLED TO, READ BACK BY AND VERIFIED WITH: ?A.BRITT,RN 01/04/2022 AT 1859 A.HUGHES ?   ?  Chloride 94 (L) 98 - 111 mmol/L  ?  CO2 23 22 - 32 mmol/L  ?  Glucose, Bld 109 (H) 70 - 99 mg/dL  ?    Comment: Glucose reference range applies only to samples taken after fasting for at least 8 hours.  ?  BUN 6 (L) 8 - 23 mg/dL  ?  Creatinine, Ser 0.80 0.44 - 1.00 mg/dL  ?  Calcium 7.9 (L) 8.9 - 10.3 mg/dL  ?  Total Protein 6.3 (L) 6.5 - 8.1 g/dL  ?  Albumin 2.1 (L) 3.5 - 5.0 g/dL  ?  AST 20 15 - 41 U/L  ?  ALT 10 0 - 44 U/L  ?  Alkaline Phosphatase 59 38 - 126 U/L  ?  Total Bilirubin 0.6 0.3 - 1.2 mg/dL  ?  GFR, Estimated >60 >60 mL/min  ?    Comment: (NOTE) ?Calculated using the CKD-EPI Creatinine Equation (2021) ?   ?  Anion gap 12 5 - 15  ?    Comment: Performed at [facility], [address], [place], [place] [address]  ?CBC with Differential     Status: Abnormal  ?  Collection Time: 01/04/22  6:08 PM  ?Result Value Ref Range  ?  WBC 9.2 4.0 - 10.5 K/uL  ?  RBC 4.02 3.87 - 5.11 MIL/uL  ?  Hemoglobin 13.4 12.0 - 15.0 g/dL  ?  HCT 38.9 36.0 - 46.0 %  ?  MCV 96.8 80.0 - 100.0 fL  ?  MCH 33.3 26.0 - 34.0 pg  ?  MCHC 34.4 30.0 - 36.0 g/dL  ?  RDW 13.5 11.5 - 15.5 %  ?  Platelets 346 150 - 400 K/uL  ?  nRBC 0.0 0.0 - 0.2 %  ?  Neutrophils Relative % 86 %  ?  Neutro Abs 7.8 (H) 1.7 - 7.7 K/uL  ?  Lymphocytes Relative 9 %  ?  Lymphs Abs 0.8 0.7 - 4.0 K/uL  ?  Monocytes Relative 4 %  ?  Monocytes Absolute 0.4 0.1 - 1.0 K/uL  ?  Eosinophils Relative 0 %  ?  Eosinophils Absolute 0.0 0.0 - 0.5 K/uL  ?  Basophils Relative 0 %  ?  Basophils Absolute 0.0 0.0 - 0.1 K/uL  ?  Immature Granulocytes 1 %  ?  Abs Immature Granulocytes 0.05 0.00 - 0.07 K/uL  ?    Comment: Performed at [facility], [address] [place], [place] [address]  ?Protime-INR     Status: None  ?  Collection Time: 01/04/22  6:08 PM  ?Result Value Ref Range  ?  Prothrombin Time 15.0 11.4 - 15.2 seconds  ?  INR 1.2 0.8 - 1.2  ?    Comment: (NOTE) ?INR goal varies based on device and disease states. ?Performed at [facility], [address] [place], [place] ?[id number] ?   ?  ?  ? ?Imaging Results (Last 48 hours)  ?DG Chest 2 View ?  ?Result Date: 01/04/2022 ?CLINICAL DATA:  Sepsis EXAM: CHEST - 2 VIEW COMPARISON:  Chest x-ray 12/17/2021 and chest CT 12/23/2021 FINDINGS: Heart is mildly enlarged. Mediastinum appears stable and within normal limits. Pulmonary vasculature is normal. Irregular strandy opacities in the right lung base, possibly similar to previous CT. No pleural effusion or pneumothorax identified. Elevated right hemidiaphragm. IMPRESSION: Strandy opacities at the right lung base which may represent atelectasis or infiltrate. Electronically Signed   By: Jannifer Hickelaney  Williams M.D.   On: 01/04/2022 16:25    ?  ?  ?  ?Assessment/Plan ?This is a 66 year old female presenting almost 3 weeks after repair of a strangulated right femoral hernia with small bowel resection, with right groin erythema and fevers.  She has continued purulent drainage from her percutaneous drain.  The area of erythema and swelling in her groin corresponds to the location of her previous femoral hernia sac.  However, on her previous CT there was a fluid collection in this area.  I suspect that she now has an abscess and not a recurrent hernia, and she has continued to have regular bowel function and does not have significant tenderness on exam . We will follow-up her CT scan to confirm this before formulating a plan.   If this is indeed an abscess and not a recurrent hernia, she will  require drainage. ?  ?Final plan pending CT results. Discussed with oncoming on call surgeon this evening. ?  ?Sophronia SimasShelby Allen, MD ?[facility]

## 2022-01-04 NOTE — ED Triage Notes (Signed)
Pt had hernia repair and abd abscess earlier this month.  States she had staples removed this week and reports RLQ pain and they believe she has another hernia beside where she had surgery.  Pt has a JP drain for abd abscess. ?

## 2022-01-04 NOTE — Procedures (Signed)
Incision and Drainage ? ?Date/Time: 01/04/2022 9:12 PM ?Performed by: Violeta Gelinas, MD ?Authorized by: Violeta Gelinas, MD  ? ?Consent:  ?  Consent obtained:  Verbal ?  Consent given by:  Patient ?  Risks, benefits, and alternatives were discussed: yes   ?Universal protocol:  ?  Procedure explained and questions answered to patient or proxy's satisfaction: yes   ?  Test results available : yes   ?  Imaging studies available: yes   ?  Immediately prior to procedure, a time out was called: yes   ?Location:  ?  Type:  Abscess ?  Location: R groin. ?Anesthesia:  ?  Anesthesia method:  Local infiltration ?Procedure type:  ?  Complexity:  Simple ?Procedure details:  ?  Ultrasound guidance: no   ?  Needle aspiration: no   ?  Incision types:  Single straight ?  Incision depth:  Subcutaneous ?  Wound management:  Irrigated with saline ?  Drainage:  Purulent ?  Drainage amount:  Moderate ?  Wound treatment:  Wound left open ?  Packing materials:  1/4 in iodoform gauze ?Post-procedure details:  ?  Procedure completion:  Tolerated well, no immediate complications ?Violeta Gelinas, MD, MPH, FACS ?Please use [email or web address] to contact on call provider ? ?

## 2022-01-04 NOTE — ED Notes (Signed)
RN unsuccessful in starting IV, IV team consult placed. ?

## 2022-01-04 NOTE — ED Notes (Signed)
Patient transported to CT 

## 2022-01-04 NOTE — ED Provider Notes (Signed)
?[facility] EMERGENCY DEPARTMENT ?Provider Note ? ? ?CSN: [id number] ?Arrival date & time: 01/04/22  1554 ? ?  ? ?History ? ?No chief complaint on file. ? ? ?Cynthia Morrison is a 66 y.o. female. ? ?HPI ?66 year old female presents today complaining of swelling in right groin.  She reports recent hospitalization with surgery for femoral hernia and that she has had return of swelling in the right groin.  She complains of some pain in this area.  She states that this is consistent with her prior right femoral hernia. ?Review of records reveal patient presented with a incarcerated right femoral hernia.  She underwent emergent resection and required small bowel resection.  She continued to have leukocytosis and follow-up CT scan showed pelvic fluid conduction consistent with abscess.  She went underwent an IR procedure with drain placement.  She also had AKI and bilateral pneumonia.  Cultures grew out Enterococcus.  She was on meropenem and went home on Augmentin ?  ? ?Home Medications ?Prior to Admission medications   ?Medication Sig Start Date End Date Taking? Authorizing Provider  ?Sodium Chloride Flush (NORMAL SALINE FLUSH) 0.9 % SOLN Use 5 mLs by Intracatheter route daily for 14 days 12/25/21   Han, Aimee H, PA-C  ?amLODipine (NORVASC) 5 MG tablet Take 1 tablet (5 mg total) by mouth daily. 06/30/19   Jodelle Red, MD  ?amoxicillin-clavulanate (AUGMENTIN) 875-125 MG tablet Take 1 tablet by mouth 2 (two) times daily for 10 days. 12/26/21 01/05/22  Burnadette Pop, MD  ?docusate sodium (COLACE) 100 MG capsule Take 1 capsule (100 mg total) by mouth every 12 (twelve) hours. ?Patient not taking: Reported on 12/18/2021 12/13/21   Valentino Nose, NP  ?fluconazole (DIFLUCAN) 200 MG tablet Take 2 tablets (400 mg total) by mouth daily for 10 days. 12/26/21 01/05/22  Burnadette Pop, MD  ?ibuprofen (ADVIL) 200 MG tablet Take 400 mg by mouth every 6 (six) hours as needed for mild pain.    [provider]  ?losartan (COZAAR) 50 MG tablet Take 50 mg by mouth 2 (two) times daily. 06/23/19   [provider]  ?ondansetron (ZOFRAN-ODT) 4 MG disintegrating tablet Take 1 tablet (4 mg total) by mouth every 8 (eight) hours as needed for nausea or vomiting. 12/13/21   Valentino Nose, NP  ?polyethylene glycol (MIRALAX) 17 g packet Take 17 g by mouth daily. ?Patient not taking: Reported on 12/18/2021 12/13/21   Valentino Nose, NP  ?   ? ?Allergies    ?Buspirone hcl, Codeine, Sertraline hcl, Aleve [naproxen sodium], and Sulfamethoxazole-trimethoprim   ? ?Review of Systems   ?Review of Systems  ?All other systems reviewed and are negative. ? ?Physical Exam ?Updated Vital Signs ?BP 118/60   Pulse 86   Temp 99.9 ?F (37.7 ?C) (Oral)   Resp (!) 26   SpO2 97%  ?Physical Exam ?Vitals (Vital signs reviewed with significant tachycardia and temp of 101.9) and nursing note reviewed.  ?Constitutional:   ?   General: She is not in acute distress. ?   Appearance: Normal appearance. She is not ill-appearing.  ?HENT:  ?   Head: Normocephalic.  ?   Right Ear: External ear normal.  ?   Left Ear: External ear normal.  ?   Nose: Nose normal.  ?   Mouth/Throat:  ?   Mouth: Mucous membranes are dry.  ?   Pharynx: Oropharynx is clear.  ?Eyes:  ?   Extraocular Movements: Extraocular movements intact.  ?  Pupils: Pupils are equal, round, and reactive to light.  ?Cardiovascular:  ?   Rate and Rhythm: Regular rhythm. Tachycardia present.  ?   Pulses: Normal pulses.  ?Pulmonary:  ?   Effort: Pulmonary effort is normal.  ?   Breath sounds: Normal breath sounds.  ?Abdominal:  ?   General: Abdomen is flat.  ?   Palpations: Abdomen is soft.  ?   Comments:  ?Midline wound ?Right femoral swelling consistent with hernia  ?Musculoskeletal:     ?   General: Normal range of motion.  ?   Cervical back: Normal range of motion.  ?Skin: ?   General: Skin is warm and dry.  ?   Capillary Refill: Capillary refill takes less than 2 seconds.   ?   Comments: JP drain going to low back area  ?Neurological:  ?   General: No focal deficit present.  ?   Mental Status: She is alert.  ?Psychiatric:     ?   Mood and Affect: Mood normal.  ? ? ?ED Results / Procedures / Treatments   ?Labs ?(all labs ordered are listed, but only abnormal results are displayed) ?Labs Reviewed  ?COMPREHENSIVE METABOLIC PANEL - Abnormal; Notable for the following components:  ?    Result Value  ? Sodium 129 (*)   ? Potassium 2.7 (*)   ? Chloride 94 (*)   ? Glucose, Bld 109 (*)   ? BUN 6 (*)   ? Calcium 7.9 (*)   ? Total Protein 6.3 (*)   ? Albumin 2.1 (*)   ? All other components within normal limits  ?CBC WITH DIFFERENTIAL/PLATELET - Abnormal; Notable for the following components:  ? Neutro Abs 7.8 (*)   ? All other components within normal limits  ?CULTURE, BLOOD (ROUTINE X 2)  ?CULTURE, BLOOD (ROUTINE X 2)  ?LACTIC ACID, PLASMA  ?PROTIME-INR  ?LACTIC ACID, PLASMA  ?URINALYSIS, ROUTINE W REFLEX MICROSCOPIC  ?APTT  ? ? ?EKG ?None ? ?Radiology ?DG Chest 2 View ? ?Result Date: 01/04/2022 ?CLINICAL DATA:  Sepsis EXAM: CHEST - 2 VIEW COMPARISON:  Chest x-Jenesys Casseus 12/17/2021 and chest CT 12/23/2021 FINDINGS: Heart is mildly enlarged. Mediastinum appears stable and within normal limits. Pulmonary vasculature is normal. Irregular strandy opacities in the right lung base, possibly similar to previous CT. No pleural effusion or pneumothorax identified. Elevated right hemidiaphragm. IMPRESSION: Strandy opacities at the right lung base which may represent atelectasis or infiltrate. Electronically Signed   By: Jannifer Hick M.D.   On: 01/04/2022 16:25  ? ?CT ABDOMEN PELVIS W CONTRAST ? ?Result Date: 01/04/2022 ?CLINICAL DATA:  Abdominal pain EXAM: CT ABDOMEN AND PELVIS WITH CONTRAST TECHNIQUE: Multidetector CT imaging of the abdomen and pelvis was performed using the standard protocol following bolus administration of intravenous contrast. RADIATION DOSE REDUCTION: This exam was performed according  to the departmental dose-optimization program which includes automated exposure control, adjustment of the mA and/or kV according to patient size and/or use of iterative reconstruction technique. CONTRAST:  OMNIPAQUE IOHEXOL 300 MG/ML  SOLN COMPARISON:  CT abdomen and pelvis 12/23/2021 FINDINGS: Lower chest: Small pericardial effusion which is new since previous study. Bibasilar atelectatic changes right greater than left. Hepatobiliary: Liver is normal in size and contour. Small area of focal fatty infiltration adjacent to the falciform ligament. Subcentimeter hypodensity in the posterior right hepatic lobe likely represents a cyst or hemangioma. Small stones in the gallbladder. No gallbladder wall thickening or surrounding edema. No biliary ductal dilatation. Pancreas: Unremarkable. No pancreatic ductal  dilatation or surrounding inflammatory changes. Spleen: Normal in size without focal abnormality. Adrenals/Urinary Tract: Stable thickening of the adrenal glands. Mild renal cortical thinning and lobulation, left worse than right. No renal mass or hydronephrosis identified bilaterally. Urinary bladder appears within normal limits. Stomach/Bowel: No bowel obstruction, free air or pneumatosis. Colonic diverticulosis. Chronic appearing wall thickening of the sigmoid colon which is improved since previous study. Postsurgical changes of bowel in the mid lower abdomen. Vascular/Lymphatic: Aortic atherosclerosis. No enlarged abdominal or pelvic lymph nodes. Reproductive: No suspicious uterine or adnexal mass. Other: Interval enlargement of a subcutaneous fluid collection in the right anterior abdominal wall just below the level of the umbilicus, likely communicates with the peritoneal cavity through small hernia defects, now measuring 5.4 x 3.2 by 5.5 cm in axial and craniocaudal dimensions, with no significant rim enhancement or associated air. Interval placement of a left gluteal percutaneous pigtail drainage  catheter with the tip in the region of the posterior cul-de-sac, with central resolution or near complete resolution of the previous abscess collection in this region. There is persistent amorphous peripherally enhan

## 2022-01-05 LAB — BASIC METABOLIC PANEL
Anion gap: 6 (ref 5–15)
BUN: 5 mg/dL — ABNORMAL LOW (ref 8–23)
CO2: 24 mmol/L (ref 22–32)
Calcium: 7 mg/dL — ABNORMAL LOW (ref 8.9–10.3)
Chloride: 103 mmol/L (ref 98–111)
Creatinine, Ser: 0.62 mg/dL (ref 0.44–1.00)
GFR, Estimated: >60 mL/min (ref >60)
Glucose, Bld: 88 mg/dL (ref 70–99)
Potassium: 3.6 mmol/L (ref 3.5–5.1)
Sodium: 133 mmol/L — ABNORMAL LOW (ref 135–145)

## 2022-01-05 LAB — URINALYSIS, ROUTINE W REFLEX MICROSCOPIC
Bilirubin Urine: NEGATIVE
Glucose, UA: NEGATIVE mg/dL
Hgb urine dipstick: NEGATIVE
Ketones, ur: NEGATIVE mg/dL
Leukocytes,Ua: NEGATIVE
Nitrite: NEGATIVE
Protein, ur: NEGATIVE mg/dL
Specific Gravity, Urine: 1.034 — ABNORMAL HIGH (ref 1.005–1.030)
pH: 8 (ref 5.0–8.0)

## 2022-01-05 LAB — CBC
HCT: 26.7 % — ABNORMAL LOW (ref 36.0–46.0)
Hemoglobin: 9.1 g/dL — ABNORMAL LOW (ref 12.0–15.0)
MCH: 33.6 pg (ref 26.0–34.0)
MCHC: 34.1 g/dL (ref 30.0–36.0)
MCV: 98.5 fL (ref 80.0–100.0)
Platelets: 244 10*3/uL (ref 150–400)
RBC: 2.71 MIL/uL — ABNORMAL LOW (ref 3.87–5.11)
RDW: 13.7 % (ref 11.5–15.5)
WBC: 7.9 10*3/uL (ref 4.0–10.5)
nRBC: 0 % (ref 0.0–0.2)

## 2022-01-05 LAB — LACTIC ACID, PLASMA: Lactic Acid, Venous: 0.8 mmol/L (ref 0.5–1.9)

## 2022-01-05 LAB — APTT: aPTT: 32 s (ref 24–36)

## 2022-01-05 NOTE — Progress Notes (Signed)
? ?Progress Note ? ?   ?Subjective: ?Having some soreness at transgluteal drain site and right groin. She does not like how opioid pain medications make her feel and so far pain is being controlled with antiinflammatories. Feeling dizzy this am. No GI complaints - passing flatus. ? ? ?Objective: ?Vital signs in last 24 hours: ?Temp:  [99.9 ?F (37.7 ?C)-101.9 ?F (38.8 ?C)] 99.9 ?F (37.7 ?C) (04/15 2028) ?Pulse Rate:  [58-132] 58 (04/16 0700) ?Resp:  [8-31] 15 (04/16 0700) ?BP: (96-126)/(58-85) 96/72 (04/16 0700) ?SpO2:  [93 %-98 %] 93 % (04/16 0700) ?  ? ?Intake/Output from previous day: ?04/15 0701 - 04/16 0700 ?In: 1000 [IV Piggyback:1000] ?Out: -  ?Intake/Output this shift: ?No intake/output data recorded. ? ?PE: ?General: pleasant, WD, female who is laying in bed in NAD ?Heart: regular rate on monitor. Palpable radial pulses bilaterally ?Lungs: Respiratory effort nonlabored on room air ?Abd: soft, NT, ND ?Transgluteal IR drain with purulent fluid. Bandage c/d/I ?Right groin abscess site with bloody purulent fluid on dressing. Incision with packing and no significant cellulitis or induration ?MSK: all 4 extremities are symmetrical with no cyanosis, clubbing, or edema. ?Skin: warm and dry ?Psych: A&Ox3 with an appropriate affect.  ? ? ?Lab Results:  ?Recent Labs  ?  01/04/22 ?1808 01/05/22 ?0540  ?WBC 9.2 7.9  ?HGB 13.4 9.1*  ?HCT 38.9 26.7*  ?PLT 346 244  ? ?BMET ?Recent Labs  ?  01/04/22 ?1808 01/05/22 ?0540  ?NA 129* 133*  ?K 2.7* 3.6  ?CL 94* 103  ?CO2 23 24  ?GLUCOSE 109* 88  ?BUN 6* 5*  ?CREATININE 0.80 0.62  ?CALCIUM 7.9* 7.0*  ? ?PT/INR ?Recent Labs  ?  01/04/22 ?1808  ?LABPROT 15.0  ?INR 1.2  ? ?CMP  ?   ?Component Value Date/Time  ? NA 133 (L) 01/05/2022 0540  ? K 3.6 01/05/2022 0540  ? CL 103 01/05/2022 0540  ? CO2 24 01/05/2022 0540  ? GLUCOSE 88 01/05/2022 0540  ? BUN 5 (L) 01/05/2022 0540  ? CREATININE 0.62 01/05/2022 0540  ? CALCIUM 7.0 (L) 01/05/2022 0540  ? PROT 6.3 (L) 01/04/2022 1808  ? ALBUMIN  2.1 (L) 01/04/2022 1808  ? AST 20 01/04/2022 1808  ? ALT 10 01/04/2022 1808  ? ALKPHOS 59 01/04/2022 1808  ? BILITOT 0.6 01/04/2022 1808  ? GFRNONAA >60 01/05/2022 0540  ? GFRAA >60 10/24/2015 1308  ? ?Lipase  ?No results found for: LIPASE ? ? ? ? ?Studies/Results: ?DG Chest 2 View ? ?Result Date: 01/04/2022 ?CLINICAL DATA:  Sepsis EXAM: CHEST - 2 VIEW COMPARISON:  Chest x-ray 12/17/2021 and chest CT 12/23/2021 FINDINGS: Heart is mildly enlarged. Mediastinum appears stable and within normal limits. Pulmonary vasculature is normal. Irregular strandy opacities in the right lung base, possibly similar to previous CT. No pleural effusion or pneumothorax identified. Elevated right hemidiaphragm. IMPRESSION: Strandy opacities at the right lung base which may represent atelectasis or infiltrate. Electronically Signed   By: Ofilia Neas M.D.   On: 01/04/2022 16:25  ? ?CT ABDOMEN PELVIS W CONTRAST ? ?Result Date: 01/04/2022 ?CLINICAL DATA:  Abdominal pain EXAM: CT ABDOMEN AND PELVIS WITH CONTRAST TECHNIQUE: Multidetector CT imaging of the abdomen and pelvis was performed using the standard protocol following bolus administration of intravenous contrast. RADIATION DOSE REDUCTION: This exam was performed according to the departmental dose-optimization program which includes automated exposure control, adjustment of the mA and/or kV according to patient size and/or use of iterative reconstruction technique. CONTRAST:  139mL OMNIPAQUE IOHEXOL 300  MG/ML  SOLN COMPARISON:  CT abdomen and pelvis 12/23/2021 FINDINGS: Lower chest: Small pericardial effusion which is new since previous study. Bibasilar atelectatic changes right greater than left. Hepatobiliary: Liver is normal in size and contour. Small area of focal fatty infiltration adjacent to the falciform ligament. Subcentimeter hypodensity in the posterior right hepatic lobe likely represents a cyst or hemangioma. Small stones in the gallbladder. No gallbladder wall  thickening or surrounding edema. No biliary ductal dilatation. Pancreas: Unremarkable. No pancreatic ductal dilatation or surrounding inflammatory changes. Spleen: Normal in size without focal abnormality. Adrenals/Urinary Tract: Stable thickening of the adrenal glands. Mild renal cortical thinning and lobulation, left worse than right. No renal mass or hydronephrosis identified bilaterally. Urinary bladder appears within normal limits. Stomach/Bowel: No bowel obstruction, free air or pneumatosis. Colonic diverticulosis. Chronic appearing wall thickening of the sigmoid colon which is improved since previous study. Postsurgical changes of bowel in the mid lower abdomen. Vascular/Lymphatic: Aortic atherosclerosis. No enlarged abdominal or pelvic lymph nodes. Reproductive: No suspicious uterine or adnexal mass. Other: Interval enlargement of a subcutaneous fluid collection in the right anterior abdominal wall just below the level of the umbilicus, likely communicates with the peritoneal cavity through small hernia defects, now measuring 5.4 x 3.2 by 5.5 cm in axial and craniocaudal dimensions, with no significant rim enhancement or associated air. Interval placement of a left gluteal percutaneous pigtail drainage catheter with the tip in the region of the posterior cul-de-sac, with central resolution or near complete resolution of the previous abscess collection in this region. There is persistent amorphous peripherally enhancing abscess collection located just anterior to the uterus abutting the urinary bladder just to the left of midline which measures 5.2 x 2.1 cm in axial dimensions. Interval enlargement of the air-fluid level collection in the right inguinal region which extends through a hernia defect and now demonstrates peripheral enhancement measuring 4.4 x 5 x 5.2 cm consistent with abscess. Adjacent heterogeneous fat within the right pelvis region of previous hernia repair. Musculoskeletal: Multilevel  chronic compression fracture deformities. Degenerative changes in the lumbar spine. No suspicious bony lesions identified. IMPRESSION: 1. Interval enlargement of a subcutaneous air-fluid level collection in the right inguinal region which extends through a hernia defect and now demonstrates rim enhancement suggesting abscess. 2. Persistent abscess between the uterus and urinary bladder, decreased in size since previous study. 3. Interval resolution/near complete resolution of the abscess collection in the posterior cul-de-sac, with interval pigtail drainage catheter placement. 4. Interval enlargement/development of a subcutaneous fluid collection in the right anterior abdominal wall just below the level of the umbilicus which likely communicates through small hernia defects. No significant rim enhancement to suggest infection/abscess. 5. Colonic diverticulosis. Wall thickening of the sigmoid colon which is improved since previous study. 6. Small pericardial effusion which is new since previous study. 7. Other chronic findings as described. Electronically Signed   By: Ofilia Neas M.D.   On: 01/04/2022 20:29   ? ?Anti-infectives: ?Anti-infectives (From admission, onward)  ? ? Start     Dose/Rate Route Frequency Ordered Stop  ? 01/05/22 1800  vancomycin (VANCOCIN) IVPB 1000 mg/200 mL premix       ? 1,000 mg ?200 mL/hr over 60 Minutes Intravenous Every 24 hours 01/04/22 1904    ? 01/04/22 1715  meropenem (MERREM) 1 g in sodium chloride 0.9 % 100 mL IVPB       ? 1 g ?200 mL/hr over 30 Minutes Intravenous Every 8 hours 01/04/22 1704    ? 01/04/22 1715  vancomycin (VANCOCIN) IVPB 1000 mg/200 mL premix       ? 1,000 mg ?200 mL/hr over 60 Minutes Intravenous  Once 01/04/22 1704 01/04/22 2123  ? ?  ? ? ? ?Assessment/Plan ? S/p lap R femoral hernia repair with mesh, ex lap SBR with anastomosis for strangulated hernia  Dr. Rosendo Gros 3/28 ?R groin abscess ?- IR transgluteal drain 4/4 for pelvic abscess ?Returned to ED 4/15  for R groin abscess ?- s/p bedside I&D R groin abscess 4/15 Dr. Grandville Silos ?- follow abscess culture ?- continue IV abx. Was on course of augmentin prior to admission to be completed today. Complete course of diflucan

## 2022-01-05 NOTE — ED Notes (Signed)
Patient complaining of 8/10 abdominal pain after dressing change. This RN will administer ordered PRN pain medication.  ?

## 2022-01-05 NOTE — ED Notes (Signed)
Breakfast order placed ?

## 2022-01-05 NOTE — ED Notes (Signed)
Dressing to RLQ incision 100% satuarated with purulent drainage. Gauze changed. Packing noted to be in place. Pt tolerated dressing change well. ?

## 2022-01-05 NOTE — ED Notes (Signed)
I tried to get patient blood, I didn't have any success. 

## 2022-01-06 ENCOUNTER — Encounter (HOSPITAL_COMMUNITY): Payer: Self-pay | Admitting: General Surgery

## 2022-01-06 LAB — CBC
HCT: 31.5 % — ABNORMAL LOW (ref 36.0–46.0)
Hemoglobin: 10.2 g/dL — ABNORMAL LOW (ref 12.0–15.0)
MCH: 32.6 pg (ref 26.0–34.0)
MCHC: 32.4 g/dL (ref 30.0–36.0)
MCV: 100.6 fL — ABNORMAL HIGH (ref 80.0–100.0)
Platelets: 258 10*3/uL (ref 150–400)
RBC: 3.13 MIL/uL — ABNORMAL LOW (ref 3.87–5.11)
RDW: 13.6 % (ref 11.5–15.5)
WBC: 7.9 10*3/uL (ref 4.0–10.5)
nRBC: 0 % (ref 0.0–0.2)

## 2022-01-06 MED ORDER — LOSARTAN POTASSIUM 50 MG PO TABS
50.0000 mg | ORAL_TABLET | Freq: Two times a day (BID) | ORAL | Status: DC
  Administered 2022-01-06 – 2022-01-09 (×7): 50 mg via ORAL
  Filled 2022-01-06 (×7): qty 1

## 2022-01-06 MED ORDER — ACETAMINOPHEN 500 MG PO TABS: 1000.0000 mg | ORAL_TABLET | Freq: Four times a day (QID) | ORAL | Status: DC | PRN

## 2022-01-06 MED ORDER — POLYETHYLENE GLYCOL 3350 17 G PO PACK: 17.0000 g | PACK | Freq: Every day | ORAL | Status: DC

## 2022-01-06 MED ORDER — ENOXAPARIN SODIUM 40 MG/0.4ML IJ SOSY
40.0000 mg | PREFILLED_SYRINGE | INTRAMUSCULAR | Status: DC
Start: 2022-01-08 — End: 2022-01-09
  Administered 2022-01-08 – 2022-01-09 (×2): 40 mg via SUBCUTANEOUS
  Filled 2022-01-06 (×3): qty 0.4

## 2022-01-06 MED ORDER — DOCUSATE SODIUM 100 MG PO CAPS
100.0000 mg | ORAL_CAPSULE | Freq: Two times a day (BID) | ORAL | Status: DC
  Administered 2022-01-07 – 2022-01-08 (×4): 100 mg via ORAL
  Filled 2022-01-06 (×7): qty 1

## 2022-01-06 MED ORDER — AMLODIPINE BESYLATE 5 MG PO TABS
5.0000 mg | ORAL_TABLET | Freq: Every day | ORAL | Status: DC
  Administered 2022-01-06 – 2022-01-09 (×4): 5 mg via ORAL
  Filled 2022-01-06 (×4): qty 1

## 2022-01-06 MED ORDER — POLYETHYLENE GLYCOL 3350 17 G PO PACK: 17.0000 g | PACK | Freq: Every day | ORAL | Status: DC | PRN

## 2022-01-06 NOTE — Consult Note (Signed)
? ?Chief Complaint: ?Patient was seen in consultation today for intra abdominal abscess drain placement (x 2) ?Chief Complaint  ?Patient presents with  ? Post-op Problem  ? at the request of Dr Darnelle Spangle ? ? ?Supervising Physician: Marliss Coots ? ?Patient Status: [facility] - ED ? ?History of Present Illness: ?Cynthia Morrison is a 66 y.o. female  ? ?Known to IR ?Pelvic abscess drain placed 12/24/21 ?Incarcerated inguinal hernia repair and laparotomy 12/17/21--- post op pelvic abscess ? ?To ED 4/15 with Rt groin abscess ?Bedside I/D  ?More pain and induration/redness ?01/04/22 CT:IMPRESSION: ?1. Interval enlargement of a subcutaneous air-fluid level collection ?in the right inguinal region which extends through a hernia defect ?and now demonstrates rim enhancement suggesting abscess. ?2. Persistent abscess between the uterus and urinary bladder, ?decreased in size since previous study. ?3. Interval resolution/near complete resolution of the abscess ?collection in the posterior cul-de-sac, with interval pigtail ?drainage catheter placement. ?4. Interval enlargement/development of a subcutaneous fluid ?collection in the right anterior abdominal wall just below the level ?of the umbilicus which likely communicates through small hernia ?defects. No significant rim enhancement to suggest ?infection/abscess. ?5. Colonic diverticulosis. Wall thickening of the sigmoid colon ?which is improved since previous study. ?6. Small pericardial effusion which is new since previous study. ?7. Other chronic findings as described ? ?CCS requesting evaluation for possible additional drains ?Dr Elby Showers has reviewed imaging and approves at least one if not two drains to be placed ? ? ?Past Medical History:  ?Diagnosis Date  ? Hypertension   ? ? ?Past Surgical History:  ?Procedure Laterality Date  ? BACK SURGERY    ? GANGLION CYST EXCISION Right   ? INGUINAL HERNIA REPAIR Right 12/17/2021  ? Procedure: LAPAROSCOPIC RIGHT FEMORAL HERNIA REPAIR WITH  MESH;  Surgeon: Axel Filler, MD;  Location: [facility] OR;  Service: General;  Laterality: Right;  ? INGUINAL HERNIA REPAIR Right 12/17/2021  ? Procedure: EXPLORATORY LAPAROTOMY SMALL BOWEL RESECTION WITH ANASTOMOSIS;  Surgeon: Axel Filler, MD;  Location: [facility] OR;  Service: General;  Laterality: Right;  ? WISDOM TOOTH EXTRACTION    ? ? ?Allergies: ?Buspirone hcl, Codeine, Sertraline hcl, Aleve [naproxen sodium], and Sulfamethoxazole-trimethoprim ? ?Medications: ?Prior to Admission medications   ?Medication Sig Start Date End Date Taking? Authorizing Provider  ?amLODipine (NORVASC) 5 MG tablet Take 1 tablet (5 mg total) by mouth daily. ?Patient taking differently: Take 5 mg by mouth in the morning and at bedtime. 06/30/19  Yes Jodelle Red, MD  ?losartan (COZAAR) 50 MG tablet Take 50 mg by mouth 2 (two) times daily. 06/23/19  Yes [provider]  ?ondansetron (ZOFRAN-ODT) 4 MG disintegrating tablet Take 1 tablet (4 mg total) by mouth every 8 (eight) hours as needed for nausea or vomiting. 12/13/21  Yes Valentino Nose, NP  ?Sodium Chloride Flush (NORMAL SALINE FLUSH) 0.9 % SOLN Use 5 mLs by Intracatheter route daily for 14 days ?Patient taking differently: 5 mLs by Intracatheter route See admin instructions. Qd x 14 days 12/25/21  Yes Han, Aimee H, PA-C  ?docusate sodium (COLACE) 100 MG capsule Take 1 capsule (100 mg total) by mouth every 12 (twelve) hours. ?Patient not taking: Reported on 12/18/2021 12/13/21   Valentino Nose, NP  ?polyethylene glycol (MIRALAX) 17 g packet Take 17 g by mouth daily. ?Patient not taking: Reported on 12/18/2021 12/13/21   Valentino Nose, NP  ?  ? ?Family History  ?Problem Relation Age of Onset  ? Hypertension Mother   ? Heart failure Father   ?  Pancreatic cancer Father   ? ? ?Social History  ? ?Socioeconomic History  ? Marital status: Single  ?  Spouse name: Not on file  ? Number of children: Not on file  ? Years of education: Not on file  ? Highest education  level: Not on file  ?Occupational History  ? Not on file  ?Tobacco Use  ? Smoking status: Former  ? Smokeless tobacco: Never  ?Substance and Sexual Activity  ? Alcohol use: Yes  ? Drug use: No  ? Sexual activity: Not on file  ?Other Topics Concern  ? Not on file  ?Social History Narrative  ? Not on file  ? ?Social Determinants of Health  ? ?Financial Resource Strain: Not on file  ?Food Insecurity: Not on file  ?Transportation Needs: Not on file  ?Physical Activity: Not on file  ?Stress: Not on file  ?Social Connections: Not on file  ? ? ?Review of Systems: A 12 point ROS discussed and pertinent positives are indicated in the HPI above.  All other systems are negative. ? ?Review of Systems  ?Constitutional:  Positive for activity change and appetite change. Negative for fever.  ?Respiratory:  Negative for cough and shortness of breath.   ?Cardiovascular:  Negative for chest pain.  ?Gastrointestinal:  Positive for abdominal pain.  ?Psychiatric/Behavioral:  Negative for behavioral problems and confusion.   ? ?Vital Signs: ?BP 137/77   Pulse 88   Temp 97.8 ?F (36.6 ?C) (Oral)   Resp 20   SpO2 97%  ? ?Physical Exam ?Vitals reviewed.  ?HENT:  ?   Mouth/Throat:  ?   Mouth: Mucous membranes are moist.  ?Cardiovascular:  ?   Rate and Rhythm: Normal rate and regular rhythm.  ?   Heart sounds: Normal heart sounds.  ?Pulmonary:  ?   Effort: Pulmonary effort is normal.  ?   Breath sounds: Normal breath sounds.  ?Abdominal:  ?   Palpations: Abdomen is soft.  ?   Tenderness: There is abdominal tenderness.  ?Musculoskeletal:     ?   General: Normal range of motion.  ?Skin: ?   General: Skin is warm.  ?Neurological:  ?   Mental Status: She is oriented to person, place, and time.  ?Psychiatric:     ?   Behavior: Behavior normal.  ? ? ?Imaging: ?DG Chest 2 View ? ?Result Date: 01/04/2022 ?CLINICAL DATA:  Sepsis EXAM: CHEST - 2 VIEW COMPARISON:  Chest x-ray 12/17/2021 and chest CT 12/23/2021 FINDINGS: Heart is mildly enlarged.  Mediastinum appears stable and within normal limits. Pulmonary vasculature is normal. Irregular strandy opacities in the right lung base, possibly similar to previous CT. No pleural effusion or pneumothorax identified. Elevated right hemidiaphragm. IMPRESSION: Strandy opacities at the right lung base which may represent atelectasis or infiltrate. Electronically Signed   By: Jannifer Hick M.D.   On: 01/04/2022 16:25  ? ?CT ABDOMEN PELVIS W CONTRAST ? ?Result Date: 01/04/2022 ?CLINICAL DATA:  Abdominal pain EXAM: CT ABDOMEN AND PELVIS WITH CONTRAST TECHNIQUE: Multidetector CT imaging of the abdomen and pelvis was performed using the standard protocol following bolus administration of intravenous contrast. RADIATION DOSE REDUCTION: This exam was performed according to the departmental dose-optimization program which includes automated exposure control, adjustment of the mA and/or kV according to patient size and/or use of iterative reconstruction technique. CONTRAST:  OMNIPAQUE IOHEXOL 300 MG/ML  SOLN COMPARISON:  CT abdomen and pelvis 12/23/2021 FINDINGS: Lower chest: Small pericardial effusion which is new since previous study. Bibasilar atelectatic changes  right greater than left. Hepatobiliary: Liver is normal in size and contour. Small area of focal fatty infiltration adjacent to the falciform ligament. Subcentimeter hypodensity in the posterior right hepatic lobe likely represents a cyst or hemangioma. Small stones in the gallbladder. No gallbladder wall thickening or surrounding edema. No biliary ductal dilatation. Pancreas: Unremarkable. No pancreatic ductal dilatation or surrounding inflammatory changes. Spleen: Normal in size without focal abnormality. Adrenals/Urinary Tract: Stable thickening of the adrenal glands. Mild renal cortical thinning and lobulation, left worse than right. No renal mass or hydronephrosis identified bilaterally. Urinary bladder appears within normal limits. Stomach/Bowel: No  bowel obstruction, free air or pneumatosis. Colonic diverticulosis. Chronic appearing wall thickening of the sigmoid colon which is improved since previous study. Postsurgical changes of bowel in the mid lower a

## 2022-01-06 NOTE — Progress Notes (Signed)
? ?Progress Note ? ?   ?Subjective: ?Had significant pain with dressing change yesterday and requests IV pain med before dressing change this am. Still having significant drainage. No other complaints - tolerating diet without N/v/abd pain. Last BM yesterday ? ?Objective: ?Vital signs in last 24 hours: ?Pulse Rate:  [71-91] 76 (04/17 0740) ?Resp:  [15-26] 22 (04/17 0740) ?BP: (100-134)/(58-81) 134/69 (04/17 0740) ?SpO2:  [92 %-99 %] 97 % (04/17 0740) ?  ? ?Intake/Output from previous day: ?04/16 0701 - 04/17 0700 ?In: 100 [IV Piggyback:100] ?Out: 965 [Urine:900] ?Intake/Output this shift: ?No intake/output data recorded. ? ?PE: ?General: pleasant, WD, female who is laying in bed in NAD ?Heart: regular rate on monitor. Palpable radial pulses bilaterally ?Lungs: Respiratory effort nonlabored on room air ?Abd: soft, NT, ND ?Right groin abscess site with bloody purulent fluid on dressing. Packing removed with result of significant purulent drainage. There is surrounding induration and cellulitis this am. Packing replaced ?MSK: all 4 extremities are symmetrical with no cyanosis, clubbing, or edema. ?Skin: warm and dry ?Psych: A&Ox3 with an appropriate affect.  ? ? ?Lab Results:  ?Recent Labs  ?  01/05/22 ?[id number] 01/06/22 ?0449  ?WBC 7.9 7.9  ?HGB 9.1* 10.2*  ?HCT 26.7* 31.5*  ?PLT 244 258  ? ? ?BMET ?Recent Labs  ?  01/04/22 ?1808 01/05/22 ?0540  ?NA 129* 133*  ?K 2.7* 3.6  ?CL 94* 103  ?CO2 23 24  ?GLUCOSE 109* 88  ?BUN 6* 5*  ?CREATININE 0.80 0.62  ?CALCIUM 7.9* 7.0*  ? ? ?PT/INR ?Recent Labs  ?  01/04/22 ?1808  ?LABPROT 15.0  ?INR 1.2  ? ? ?CMP  ?   ?Component Value Date/Time  ? NA 133 (L) 01/05/2022 0540  ? K 3.6 01/05/2022 0540  ? CL 103 01/05/2022 0540  ? CO2 24 01/05/2022 0540  ? GLUCOSE 88 01/05/2022 0540  ? BUN 5 (L) 01/05/2022 0540  ? CREATININE 0.62 01/05/2022 0540  ? CALCIUM 7.0 (L) 01/05/2022 0540  ? PROT 6.3 (L) 01/04/2022 1808  ? ALBUMIN 2.1 (L) 01/04/2022 1808  ? AST 20 01/04/2022 1808  ? ALT 10 01/04/2022  1808  ? ALKPHOS 59 01/04/2022 1808  ? BILITOT 0.6 01/04/2022 1808  ? GFRNONAA >60 01/05/2022 0540  ? GFRAA >60 10/24/2015 1308  ? ?Lipase  ?No results found for: LIPASE ? ? ? ? ?Studies/Results: ?DG Chest 2 View ? ?Result Date: 01/04/2022 ?CLINICAL DATA:  Sepsis EXAM: CHEST - 2 VIEW COMPARISON:  Chest x-ray 12/17/2021 and chest CT 12/23/2021 FINDINGS: Heart is mildly enlarged. Mediastinum appears stable and within normal limits. Pulmonary vasculature is normal. Irregular strandy opacities in the right lung base, possibly similar to previous CT. No pleural effusion or pneumothorax identified. Elevated right hemidiaphragm. IMPRESSION: Strandy opacities at the right lung base which may represent atelectasis or infiltrate. Electronically Signed   By: Ofilia Neas M.D.   On: 01/04/2022 16:25  ? ?CT ABDOMEN PELVIS W CONTRAST ? ?Result Date: 01/04/2022 ?CLINICAL DATA:  Abdominal pain EXAM: CT ABDOMEN AND PELVIS WITH CONTRAST TECHNIQUE: Multidetector CT imaging of the abdomen and pelvis was performed using the standard protocol following bolus administration of intravenous contrast. RADIATION DOSE REDUCTION: This exam was performed according to the departmental dose-optimization program which includes automated exposure control, adjustment of the mA and/or kV according to patient size and/or use of iterative reconstruction technique. CONTRAST:  148mL OMNIPAQUE IOHEXOL 300 MG/ML  SOLN COMPARISON:  CT abdomen and pelvis 12/23/2021 FINDINGS: Lower chest: Small pericardial effusion which is new  since previous study. Bibasilar atelectatic changes right greater than left. Hepatobiliary: Liver is normal in size and contour. Small area of focal fatty infiltration adjacent to the falciform ligament. Subcentimeter hypodensity in the posterior right hepatic lobe likely represents a cyst or hemangioma. Small stones in the gallbladder. No gallbladder wall thickening or surrounding edema. No biliary ductal dilatation. Pancreas:  Unremarkable. No pancreatic ductal dilatation or surrounding inflammatory changes. Spleen: Normal in size without focal abnormality. Adrenals/Urinary Tract: Stable thickening of the adrenal glands. Mild renal cortical thinning and lobulation, left worse than right. No renal mass or hydronephrosis identified bilaterally. Urinary bladder appears within normal limits. Stomach/Bowel: No bowel obstruction, free air or pneumatosis. Colonic diverticulosis. Chronic appearing wall thickening of the sigmoid colon which is improved since previous study. Postsurgical changes of bowel in the mid lower abdomen. Vascular/Lymphatic: Aortic atherosclerosis. No enlarged abdominal or pelvic lymph nodes. Reproductive: No suspicious uterine or adnexal mass. Other: Interval enlargement of a subcutaneous fluid collection in the right anterior abdominal wall just below the level of the umbilicus, likely communicates with the peritoneal cavity through small hernia defects, now measuring 5.4 x 3.2 by 5.5 cm in axial and craniocaudal dimensions, with no significant rim enhancement or associated air. Interval placement of a left gluteal percutaneous pigtail drainage catheter with the tip in the region of the posterior cul-de-sac, with central resolution or near complete resolution of the previous abscess collection in this region. There is persistent amorphous peripherally enhancing abscess collection located just anterior to the uterus abutting the urinary bladder just to the left of midline which measures 5.2 x 2.1 cm in axial dimensions. Interval enlargement of the air-fluid level collection in the right inguinal region which extends through a hernia defect and now demonstrates peripheral enhancement measuring 4.4 x 5 x 5.2 cm consistent with abscess. Adjacent heterogeneous fat within the right pelvis region of previous hernia repair. Musculoskeletal: Multilevel chronic compression fracture deformities. Degenerative changes in the lumbar  spine. No suspicious bony lesions identified. IMPRESSION: 1. Interval enlargement of a subcutaneous air-fluid level collection in the right inguinal region which extends through a hernia defect and now demonstrates rim enhancement suggesting abscess. 2. Persistent abscess between the uterus and urinary bladder, decreased in size since previous study. 3. Interval resolution/near complete resolution of the abscess collection in the posterior cul-de-sac, with interval pigtail drainage catheter placement. 4. Interval enlargement/development of a subcutaneous fluid collection in the right anterior abdominal wall just below the level of the umbilicus which likely communicates through small hernia defects. No significant rim enhancement to suggest infection/abscess. 5. Colonic diverticulosis. Wall thickening of the sigmoid colon which is improved since previous study. 6. Small pericardial effusion which is new since previous study. 7. Other chronic findings as described. Electronically Signed   By: Ofilia Neas M.D.   On: 01/04/2022 20:29   ? ?Anti-infectives: ?Anti-infectives (From admission, onward)  ? ? Start     Dose/Rate Route Frequency Ordered Stop  ? 01/05/22 1800  vancomycin (VANCOCIN) IVPB 1000 mg/200 mL premix       ? 1,000 mg ?200 mL/hr over 60 Minutes Intravenous Every 24 hours 01/04/22 1904    ? 01/04/22 1715  meropenem (MERREM) 1 g in sodium chloride 0.9 % 100 mL IVPB       ? 1 g ?200 mL/hr over 30 Minutes Intravenous Every 8 hours 01/04/22 1704    ? 01/04/22 1715  vancomycin (VANCOCIN) IVPB 1000 mg/200 mL premix       ? 1,000 mg ?200 mL/hr  over 60 Minutes Intravenous  Once 01/04/22 1704 01/04/22 2123  ? ?  ? ? ? ?Assessment/Plan ? S/p lap R femoral hernia repair with mesh, ex lap SBR with anastomosis for strangulated hernia  Dr. Rosendo Gros 3/28 ?R groin abscess ?- IR transgluteal drain 4/4 for pelvic abscess ?- scheduled for follow up visit with Dr. Rosendo Gros 4/21  ?- scheduled for follow up with IR tomorrow  - will reach out to IR ?Returned to ED 4/15 for R groin abscess ?- s/p bedside I&D R groin abscess 4/15 Dr. Grandville Silos ?- follow abscess culture - gram stain with few gram neg rods ?- WBC normal. Temp not cha

## 2022-01-07 ENCOUNTER — Other Ambulatory Visit: Payer: BC Managed Care – PPO

## 2022-01-07 ENCOUNTER — Inpatient Hospital Stay (HOSPITAL_COMMUNITY): Payer: BC Managed Care – PPO

## 2022-01-07 LAB — AEROBIC CULTURE W GRAM STAIN (SUPERFICIAL SPECIMEN)

## 2022-01-07 MED ORDER — FENTANYL CITRATE (PF) 100 MCG/2ML IJ SOLN
INTRAMUSCULAR | Status: AC
  Filled 2022-01-07: qty 4

## 2022-01-07 MED ORDER — FENTANYL CITRATE (PF) 100 MCG/2ML IJ SOLN
INTRAMUSCULAR | Status: AC | PRN
  Administered 2022-01-07: 50 ug via INTRAVENOUS
  Administered 2022-01-07: 25 ug via INTRAVENOUS
  Administered 2022-01-07: 50 ug via INTRAVENOUS
  Administered 2022-01-07: 25 ug via INTRAVENOUS

## 2022-01-07 MED ORDER — MIDAZOLAM HCL 2 MG/2ML IJ SOLN
INTRAMUSCULAR | Status: AC | PRN
  Administered 2022-01-07 (×3): .5 mg via INTRAVENOUS
  Administered 2022-01-07: 1 mg via INTRAVENOUS
  Administered 2022-01-07: .5 mg via INTRAVENOUS

## 2022-01-07 MED ORDER — LIDOCAINE HCL 1 % IJ SOLN
INTRAMUSCULAR | Status: AC
  Filled 2022-01-07: qty 30

## 2022-01-07 MED ORDER — MIDAZOLAM HCL 2 MG/2ML IJ SOLN
INTRAMUSCULAR | Status: AC
  Filled 2022-01-07: qty 4

## 2022-01-07 MED ORDER — SODIUM CHLORIDE 0.9% FLUSH
5.0000 mL | Freq: Three times a day (TID) | INTRAVENOUS | Status: DC
  Administered 2022-01-07 – 2022-01-09 (×7): 5 mL

## 2022-01-07 NOTE — TOC Initial Note (Signed)
Transition of Care (TOC) - Initial/Assessment Note  ? ? ?Patient Details  ?Name: Cynthia Morrison ?MRN: [id number] ?Date of Birth: [birth year] ? ?Transition of Care (TOC) CM/SW Contact:    ?Marilu Favre, RN ?Phone Number: ?01/07/2022, 4:07 PM ? ?Clinical Narrative:                 ?Patient from  home alone. Plans to stay with her daughter Lorriane Shire in [place] at discharge. Discussed home health. Patient states her daughter can provide wound care and drain care if taught before discharge. Bedside nurse will provide teaching and some supplies.  ? ?NCM ordered wound care supplies through [facility]  ? ?Jana Half PA aware and provided list of supplies :gauze and tape ,  Drain gauze, 4x4's normal saline and 5 cc syringes for flushes  ? ?Expected Discharge Plan: [place] ?Barriers to Discharge: Continued Medical Work up ? ? ?Patient Goals and CMS Choice ?Patient states their goals for this hospitalization and ongoing recovery are:: to return home ?CMS Medicare.gov Compare Post Acute Care list provided to:: Patient ?Choice offered to / list presented to : Patient ? ?Expected Discharge Plan and Services ?Expected Discharge Plan: [place] ?  ?Discharge Planning Services: CM Consult ?Post Acute Care Choice: Home Health ?Living arrangements for the past 2 months: [place] ?                ?  ?  ?  ?  ?  ?HH Arranged: Patient Refused HH ?  ?  ?  ?  ? ?Prior Living Arrangements/Services ?Living arrangements for the past 2 months: [place] ?Lives with:: Self ?Patient language and need for interpreter reviewed:: Yes ?Do you feel safe going back to the place where you live?: Yes      ?Need for Family Participation in Patient Care: Yes (Comment) ?Care giver support system in place?: Yes (comment) ?  ?Criminal Activity/Legal Involvement Pertinent to Current Situation/Hospitalization: No - Comment as needed ? ?Activities of Daily Living ?Home Assistive Devices/Equipment:  Dentures (specify type) ?ADL Screening (condition at time of admission) ?Patient's cognitive ability adequate to safely complete daily activities?: Yes ?Is the patient deaf or have difficulty hearing?: No ?Does the patient have difficulty seeing, even when wearing glasses/contacts?: No ?Does the patient have difficulty concentrating, remembering, or making decisions?: No ?Patient able to express need for assistance with ADLs?: Yes ?Does the patient have difficulty dressing or bathing?: No ?Independently performs ADLs?: Yes (appropriate for developmental age) ?Does the patient have difficulty walking or climbing stairs?: No ?Weakness of Legs: None ?Weakness of Arms/Hands: None ? ?Permission Sought/Granted ?  ?Permission granted to share information with : No ?   ?   ?   ?   ? ?Emotional Assessment ?Appearance:: Appears stated age ?Attitude/Demeanor/Rapport: Engaged ?Affect (typically observed): Accepting ?Orientation: : Oriented to Self, Oriented to Place, Oriented to  Time, Oriented to Situation ?Alcohol / Substance Use: Not Applicable ?Psych Involvement: No (comment) ? ?Admission diagnosis:  Postoperative intra-abdominal abscess [T81.43XA] ?Abscess of groin, right [L02.214] ?Patient Active Problem List  ? Diagnosis Date Noted  ? Abscess of groin, right 01/04/2022  ? Intra-abdominal abscess ([place]) 12/25/2021  ? Acute renal failure (ARF) ([place]) 12/25/2021  ? Bilateral pneumonia 12/25/2021  ? Incarcerated femoral hernia 12/17/2021  ? AKI (acute kidney injury) ([place]) 12/17/2021  ? Essential hypertension 02/01/2016  ? Lightheadedness 02/01/2016  ? Anxiety state 02/01/2016  ? ?PCP:  Jenny Reichmann, PA-C ?Pharmacy:   ?  [id number] [place], [place] - [address] ?[place] ?[place] [address] ?Phone: [phone] Fax: [phone] ? ?[place], [place] ?[place] ?[place] [address] ?Phone:  [phone] Fax: [phone] ? ?[facility] ?[address] ?[place] [address] ?Phone: [phone] Fax: [phone] ? ? ? ? ?Social Determinants of Health (SDOH) Interventions ?  ? ?Readmission Risk Interventions ?   ? View : No data to display.  ?  ?  ?  ? ? ? ?

## 2022-01-07 NOTE — Procedures (Signed)
Interventional Radiology Procedure Note ? ?Procedure:  ?1.) 74F drain into LLQ inguinal abscess yielding 20 mL frank pus - sent for Cx ?2.) 19F drain into LLQ intraperitoneal collection yielding purulent bloody fluid ?3.) 74F drain into RLQ perivesicular gas collection yielding small air but no fluid or abscess.  This drain was removed.  ?4.) 74F drain into midline upper abdominal wall collection yielding turbid fluid - sent for Cx ? ?Complications: None ? ?Estimated Blood Loss: None ? ?Recommendations: ?- Drains to JP ?- Flush Q shift ?- Cultures sent x 2 ? ? ?Signed, ? ?Sterling Big, MD ? ? ?

## 2022-01-07 NOTE — Progress Notes (Signed)
? ?Progress Note ? ?   ?Subjective: ?Just got back from IR drain placement. Is having a lot of soreness from all her drains. Hungry and no GI complaints ? ?Objective: ?Vital signs in last 24 hours: ?Temp:  [97.7 ?F (36.5 ?C)-98.8 ?F (37.1 ?C)] 97.7 ?F (36.5 ?C) (04/18 1056) ?Pulse Rate:  [67-98] 68 (04/18 1056) ?Resp:  [10-24] 15 (04/18 1025) ?BP: (101-140)/(60-80) 129/71 (04/18 1056) ?SpO2:  [95 %-100 %] 97 % (04/18 1056) ?Last BM Date : 01/06/22 ? ?Intake/Output from previous day: ?04/17 0701 - 04/18 0700 ?In: 3331.5 [P.O.:120; I.V.:2504.3; IV Piggyback:697.2] ?Out: 14 [Drains:14] ?Intake/Output this shift: ?No intake/output data recorded. ? ?PE: ?General: pleasant, WD, female who is laying in bed in NAD ?Heart: regular rate on monitor. Palpable radial pulses bilaterally ?Lungs: Respiratory effort nonlabored on room air ?Abd: soft, NT, ND ?Right groin abscess site with bloody purulent fluid on packing which I removed, still with surrounding induration and erythema.  ?Superior RLQ IR drain - cloudy sanguinous ?Inferior RLQ IR drain - cloudy sanguinous ?LLQ IR drain - sanguinous ?L posterior drain - purulent cloudy yellow ?MSK: all 4 extremities are symmetrical with no cyanosis, clubbing, or edema. ?Skin: warm and dry ?Psych: A&Ox3 with an appropriate affect.  ? ? ?Lab Results:  ?Recent Labs  ?  01/05/22 ?6599 01/06/22 ?0449  ?WBC 7.9 7.9  ?HGB 9.1* 10.2*  ?HCT 26.7* 31.5*  ?PLT 244 258  ? ? ?BMET ?Recent Labs  ?  01/04/22 ?1808 01/05/22 ?0540  ?NA 129* 133*  ?K 2.7* 3.6  ?CL 94* 103  ?CO2 23 24  ?GLUCOSE 109* 88  ?BUN 6* 5*  ?CREATININE 0.80 0.62  ?CALCIUM 7.9* 7.0*  ? ? ?PT/INR ?Recent Labs  ?  01/04/22 ?1808  ?LABPROT 15.0  ?INR 1.2  ? ? ?CMP  ?   ?Component Value Date/Time  ? NA 133 (L) 01/05/2022 0540  ? K 3.6 01/05/2022 0540  ? CL 103 01/05/2022 0540  ? CO2 24 01/05/2022 0540  ? GLUCOSE 88 01/05/2022 0540  ? BUN 5 (L) 01/05/2022 0540  ? CREATININE 0.62 01/05/2022 0540  ? CALCIUM 7.0 (L) 01/05/2022 0540  ?  PROT 6.3 (L) 01/04/2022 1808  ? ALBUMIN 2.1 (L) 01/04/2022 1808  ? AST 20 01/04/2022 1808  ? ALT 10 01/04/2022 1808  ? ALKPHOS 59 01/04/2022 1808  ? BILITOT 0.6 01/04/2022 1808  ? GFRNONAA >60 01/05/2022 0540  ? GFRAA >60 10/24/2015 1308  ? ?Lipase  ?No results found for: LIPASE ? ? ? ? ?Studies/Results: ?No results found. ? ?Anti-infectives: ?Anti-infectives (From admission, onward)  ? ? Start     Dose/Rate Route Frequency Ordered Stop  ? 01/05/22 1800  vancomycin (VANCOCIN) IVPB 1000 mg/200 mL premix       ? 1,000 mg ?200 mL/hr over 60 Minutes Intravenous Every 24 hours 01/04/22 1904    ? 01/04/22 1715  meropenem (MERREM) 1 g in sodium chloride 0.9 % 100 mL IVPB       ? 1 g ?200 mL/hr over 30 Minutes Intravenous Every 8 hours 01/04/22 1704    ? 01/04/22 1715  vancomycin (VANCOCIN) IVPB 1000 mg/200 mL premix       ? 1,000 mg ?200 mL/hr over 60 Minutes Intravenous  Once 01/04/22 1704 01/04/22 2123  ? ?  ? ? ? ?Assessment/Plan ? S/p lap R femoral hernia repair with mesh, ex lap SBR with anastomosis for strangulated hernia  Dr. Derrell Lolling 3/28 ?R groin abscess ?- IR transgluteal drain 4/4 for pelvic abscess -  remains after IR eval 4/18. Continue to monitor ?- scheduled for follow up visit with Dr. Derrell Lolling 4/21  ? ?Returned to ED 4/15 for R groin abscess ?- s/p bedside I&D R groin abscess 4/15 Dr. Janee Morn ?- abscess culture with e coli sensitive to ancef ?- VSS, afebrile ?- s/p IR drain placement x4 4/18 into R inguinal abscess cavity (20 mL purulent fluid sent for culture), R lateral inguinal recess peritoneal abscess (likely communicating with R inguinal), midline ant abd wall collection (20 ml turbid brown fluid sent for cx), RLQ gas collection aspirated with air out and no drain left ?- continue drains and follow cultures ?- continue IV abx - will discuss with MD narrowing to rocephin given culture results as above. Was on course of augmentin PTA. Completed course of diflucan prior to admission ?- continue bid  dressing changes with 1/4 iodoform packing. She is not tolerating dressing changes well at this time and does not think she can manage wound care by herself at home ? ?HTN - home meds ? ?FEN: reg, IVF @ 50 ml/hr ?ID: merren/vanc 4/15> ?VTE: lovenox ? ? ? LOS: 3 days  ? ?Eric Form, PA-C ?[facility] ?01/07/2022, 11:22 AM ?Please see Amion for pager number during day hours 7:00am-4:30pm ? ?

## 2022-01-07 NOTE — Progress Notes (Signed)
Pharmacy Antibiotic Note ? ?Cynthia Morrison is a 66 y.o. female admitted on 01/04/2022 with pelvic abscess with purulent drainage.  Pharmacy has been consulted for meropenem and vancomycin dosing.  Groin culture now growing E.coli. ? ?Renal function stable, now afebrile, WBC WNL. ? ?Plan: ?Vanc 1gm IV Q24H  ?Merrem 1gm IV Q8H ?Recommend narrowing  ? ?[name] [name], PharmD, BCPS, BCCCP ?01/07/2022, 10:13 AM ? ?Height: 5' (152.4 cm) ?IBW/kg (Calculated) : 45.5 ? ?Temp (24hrs), Avg:98.3 ?F (36.8 ?C), Min:97.8 ?F (36.6 ?C), Max:98.8 ?F (37.1 ?C) ? ?Recent Labs  ?Lab 01/04/22 ?1601 01/04/22 ?1808 01/05/22 ?[id number] 01/06/22 ?0449  ?WBC  --  9.2 7.9 7.9  ?CREATININE  --  0.80 0.62  --   ?LATICACIDVEN 1.4  --  0.8  --   ?  ?Estimated Creatinine Clearance: 56.5 mL/min (by C-G formula based on SCr of 0.62 mg/dL).   ? ?Allergies  ?Allergen Reactions  ? Buspirone Hcl Other (See Comments)  ?  hallucinations/ vomitting  ? Codeine Other (See Comments)  ?  Nightmares   ? Sertraline Hcl Other (See Comments)  ?  GI upset/Vomiting  ? Aleve [Naproxen Sodium] Rash  ?  Takes Ibuprofen with no issues  ? Sulfamethoxazole-Trimethoprim Rash  ? ? ?Vanc 4/15 >> ?Merrem 4/15 >> ? ?4/4 pelvic abscess (previous admit) - E.faecalis, Candida, Bacteroides ?4/15 BCx - NGTD ?4/15 R groin wound - E.coli (S Ancef, Rocephin, Cipro, Bactrim) ? ?[place] Dodgen D. [name], PharmD, BCPS, BCCCP ?01/07/2022, 10:14 AM ? ?

## 2022-01-08 ENCOUNTER — Encounter (HOSPITAL_COMMUNITY): Payer: Self-pay | Admitting: General Surgery

## 2022-01-08 ENCOUNTER — Other Ambulatory Visit (HOSPITAL_COMMUNITY): Payer: Self-pay

## 2022-01-08 NOTE — Progress Notes (Signed)
? ? ?Referring Physician(s): ?Dr. Rolm Bookbinder ? ?Supervising Physician: Arne Cleveland ? ?Patient Status:  [facility] - In-pt ? ?Chief Complaint: ?R groin, pelvic abscess s/p right femoral hernia repair, SBR for strangulated hernia 3/28 ? ?Subjective: ?Patient lying in bed.  She has questions which are answered.  ?No concerns related to drains.  ? ?Allergies: ?Buspirone hcl, Codeine, Sertraline hcl, Aleve [naproxen sodium], and Sulfamethoxazole-trimethoprim ? ?Medications: ?Prior to Admission medications   ?Medication Sig Start Date End Date Taking? Authorizing Provider  ?amLODipine (NORVASC) 5 MG tablet Take 1 tablet (5 mg total) by mouth daily. ?Patient taking differently: Take 5 mg by mouth in the morning and at bedtime. 06/30/19  Yes Buford Dresser, MD  ?losartan (COZAAR) 50 MG tablet Take 50 mg by mouth 2 (two) times daily. 06/23/19  Yes [provider]  ?ondansetron (ZOFRAN-ODT) 4 MG disintegrating tablet Take 1 tablet (4 mg total) by mouth every 8 (eight) hours as needed for nausea or vomiting. 12/13/21  Yes Eulogio Bear, NP  ?Sodium Chloride Flush (NORMAL SALINE FLUSH) 0.9 % SOLN Use 5 mLs by Intracatheter route daily for 14 days ?Patient taking differently: 5 mLs by Intracatheter route See admin instructions. Qd x 14 days 12/25/21  Yes Han, Aimee H, PA-C  ?docusate sodium (COLACE) 100 MG capsule Take 1 capsule (100 mg total) by mouth every 12 (twelve) hours. ?Patient not taking: Reported on 12/18/2021 12/13/21   Eulogio Bear, NP  ?polyethylene glycol (MIRALAX) 17 g packet Take 17 g by mouth daily. ?Patient not taking: Reported on 12/18/2021 12/13/21   Eulogio Bear, NP  ? ? ? ?Vital Signs: ?BP 132/80 (BP Location: Left Arm)   Pulse 80   Temp 98.1 ?F (36.7 ?C) (Oral)   Resp 16   Ht 5' (1.524 m)   SpO2 97%   BMI 24.07 kg/m?  ? ?Physical Exam ?NAD, alert ?Abdomen: soft, non-tender.  ?Drain #1: TG drain in place, insertion site c/d/I. Flushes easily. Purulent-fluid in bulb.   ?Drain #2: RLQ bulb.  Insertion site c/d/I.  Flushes easily. Cloudy, thin fluid in bulb. ?Drain #3: RLQ bulb intact.  Insertion site intact. Think, bloody output. Flushes easily.  ? ?Imaging: ?DG Chest 2 View ? ?Result Date: 01/04/2022 ?CLINICAL DATA:  Sepsis EXAM: CHEST - 2 VIEW COMPARISON:  Chest x-ray 12/17/2021 and chest CT 12/23/2021 FINDINGS: Heart is mildly enlarged. Mediastinum appears stable and within normal limits. Pulmonary vasculature is normal. Irregular strandy opacities in the right lung base, possibly similar to previous CT. No pleural effusion or pneumothorax identified. Elevated right hemidiaphragm. IMPRESSION: Strandy opacities at the right lung base which may represent atelectasis or infiltrate. Electronically Signed   By: Ofilia Neas M.D.   On: 01/04/2022 16:25  ? ?CT ABDOMEN PELVIS W CONTRAST ? ?Result Date: 01/04/2022 ?CLINICAL DATA:  Abdominal pain EXAM: CT ABDOMEN AND PELVIS WITH CONTRAST TECHNIQUE: Multidetector CT imaging of the abdomen and pelvis was performed using the standard protocol following bolus administration of intravenous contrast. RADIATION DOSE REDUCTION: This exam was performed according to the departmental dose-optimization program which includes automated exposure control, adjustment of the mA and/or kV according to patient size and/or use of iterative reconstruction technique. CONTRAST:  196mL OMNIPAQUE IOHEXOL 300 MG/ML  SOLN COMPARISON:  CT abdomen and pelvis 12/23/2021 FINDINGS: Lower chest: Small pericardial effusion which is new since previous study. Bibasilar atelectatic changes right greater than left. Hepatobiliary: Liver is normal in size and contour. Small area of focal fatty infiltration adjacent to the falciform ligament. Subcentimeter  hypodensity in the posterior right hepatic lobe likely represents a cyst or hemangioma. Small stones in the gallbladder. No gallbladder wall thickening or surrounding edema. No biliary ductal dilatation. Pancreas:  Unremarkable. No pancreatic ductal dilatation or surrounding inflammatory changes. Spleen: Normal in size without focal abnormality. Adrenals/Urinary Tract: Stable thickening of the adrenal glands. Mild renal cortical thinning and lobulation, left worse than right. No renal mass or hydronephrosis identified bilaterally. Urinary bladder appears within normal limits. Stomach/Bowel: No bowel obstruction, free air or pneumatosis. Colonic diverticulosis. Chronic appearing wall thickening of the sigmoid colon which is improved since previous study. Postsurgical changes of bowel in the mid lower abdomen. Vascular/Lymphatic: Aortic atherosclerosis. No enlarged abdominal or pelvic lymph nodes. Reproductive: No suspicious uterine or adnexal mass. Other: Interval enlargement of a subcutaneous fluid collection in the right anterior abdominal wall just below the level of the umbilicus, likely communicates with the peritoneal cavity through small hernia defects, now measuring 5.4 x 3.2 by 5.5 cm in axial and craniocaudal dimensions, with no significant rim enhancement or associated air. Interval placement of a left gluteal percutaneous pigtail drainage catheter with the tip in the region of the posterior cul-de-sac, with central resolution or near complete resolution of the previous abscess collection in this region. There is persistent amorphous peripherally enhancing abscess collection located just anterior to the uterus abutting the urinary bladder just to the left of midline which measures 5.2 x 2.1 cm in axial dimensions. Interval enlargement of the air-fluid level collection in the right inguinal region which extends through a hernia defect and now demonstrates peripheral enhancement measuring 4.4 x 5 x 5.2 cm consistent with abscess. Adjacent heterogeneous fat within the right pelvis region of previous hernia repair. Musculoskeletal: Multilevel chronic compression fracture deformities. Degenerative changes in the lumbar  spine. No suspicious bony lesions identified. IMPRESSION: 1. Interval enlargement of a subcutaneous air-fluid level collection in the right inguinal region which extends through a hernia defect and now demonstrates rim enhancement suggesting abscess. 2. Persistent abscess between the uterus and urinary bladder, decreased in size since previous study. 3. Interval resolution/near complete resolution of the abscess collection in the posterior cul-de-sac, with interval pigtail drainage catheter placement. 4. Interval enlargement/development of a subcutaneous fluid collection in the right anterior abdominal wall just below the level of the umbilicus which likely communicates through small hernia defects. No significant rim enhancement to suggest infection/abscess. 5. Colonic diverticulosis. Wall thickening of the sigmoid colon which is improved since previous study. 6. Small pericardial effusion which is new since previous study. 7. Other chronic findings as described. Electronically Signed   By: Ofilia Neas M.D.   On: 01/04/2022 20:29  ? ?CT IMAGE GUIDED DRAINAGE BY PERCUTANEOUS CATHETER ? ?Addendum Date: 01/07/2022   ?ADDENDUM REPORT: 01/07/2022 12:51 ADDENDUM: Sedation: 3 mg Versed and 150 mcg fentanyl were administered intravenously. The patient's vital signs and level of consciousness were monitored continuously by radiology nursing under my direct supervision. Total monitored sedation time 59 minutes. Electronically Signed   By: Jacqulynn Cadet M.D.   On: 01/07/2022 12:51  ? ?Result Date: 01/07/2022 ?INDICATION: 65 year old female with a history of incarcerated right inguinal hernia status post emergent repair complicated by multiple intra-abdominal and inguinal abscesses. Prior CT-guided transgluteal drainage catheter placed into the pelvic fluid collection on 12/24/2021. Patient has multiple residual abscess collections and requires additional drain placement. EXAM: CT GUIDED DRAINAGE OF right lower  quadrant inguinal ABSCESS CT GUIDED DRAINAGE OF right lower quadrant peritoneal ABSCESS CT GUIDED DRAINAGE OF left lower quadrant  peritoneal ABSCESS CT GUIDED DRAINAGE OF midline upper abdominal wall ABSCESS TECHNIQUE: Multid

## 2022-01-08 NOTE — Progress Notes (Signed)
? ?Progress Note ? ?   ?Subjective: ?Feeling better today. Some soreness from drains and I&D site but pain improved overall. Tolerating diet. BM yesterday ? ?Objective: ?Vital signs in last 24 hours: ?Temp:  [97.7 ?F (36.5 ?C)-98.4 ?F (36.9 ?C)] 98.1 ?F (36.7 ?C) (04/19 0741) ?Pulse Rate:  [67-88] 80 (04/19 0741) ?Resp:  [10-23] 16 (04/19 0741) ?BP: (101-135)/(60-80) 132/80 (04/19 0741) ?SpO2:  [96 %-100 %] 97 % (04/19 0741) ?Last BM Date : 01/06/22 ? ?Intake/Output from previous day: ?04/18 0701 - 04/19 0700 ?In: 880 [P.O.:240; I.V.:600] ?Out: 355 [Drains:355] ?Intake/Output this shift: ?No intake/output data recorded. ? ?PE: ?General: pleasant, WD, female who is laying in bed in NAD ?Heart: regular rate on monitor. Palpable radial pulses bilaterally ?Lungs: Respiratory effort nonlabored on room air ?Abd: soft, NT, ND ?Right groin abscess I&D site with scant purulent drainage on bandage and no active drainage. Surrounding induration and erythema have resolved. ?Superior RLQ IR drain - serosanguinous ?Inferior RLQ IR drain - cloudy sanguinous ?LLQ/abd IR drain - cloudy serosanguinous ?L posterior drain - cloudy yellow/green ?MSK: all 4 extremities are symmetrical with no cyanosis, clubbing, or edema. ?Skin: warm and dry ?Psych: A&Ox3 with an appropriate affect.  ? ? ?Lab Results:  ?Recent Labs  ?  01/06/22 ?0449  ?WBC 7.9  ?HGB 10.2*  ?HCT 31.5*  ?PLT 258  ? ? ?BMET ?No results for input(s): NA, K, CL, CO2, GLUCOSE, BUN, CREATININE, CALCIUM in the last 72 hours. ? ?PT/INR ?No results for input(s): LABPROT, INR in the last 72 hours. ? ?CMP  ?   ?Component Value Date/Time  ? NA 133 (L) 01/05/2022 0540  ? K 3.6 01/05/2022 0540  ? CL 103 01/05/2022 0540  ? CO2 24 01/05/2022 0540  ? GLUCOSE 88 01/05/2022 0540  ? BUN 5 (L) 01/05/2022 0540  ? CREATININE 0.62 01/05/2022 0540  ? CALCIUM 7.0 (L) 01/05/2022 0540  ? PROT 6.3 (L) 01/04/2022 1808  ? ALBUMIN 2.1 (L) 01/04/2022 1808  ? AST 20 01/04/2022 1808  ? ALT 10 01/04/2022  1808  ? ALKPHOS 59 01/04/2022 1808  ? BILITOT 0.6 01/04/2022 1808  ? GFRNONAA >60 01/05/2022 0540  ? GFRAA >60 10/24/2015 1308  ? ?Lipase  ?No results found for: LIPASE ? ? ? ? ?Studies/Results: ?CT IMAGE GUIDED DRAINAGE BY PERCUTANEOUS CATHETER ? ?Addendum Date: 01/07/2022   ?ADDENDUM REPORT: 01/07/2022 12:51 ADDENDUM: Sedation: 3 mg Versed and 150 mcg fentanyl were administered intravenously. The patient's vital signs and level of consciousness were monitored continuously by radiology nursing under my direct supervision. Total monitored sedation time 59 minutes. Electronically Signed   By: Malachy Moan M.D.   On: 01/07/2022 12:51  ? ?Result Date: 01/07/2022 ?INDICATION: 66 year old female with a history of incarcerated right inguinal hernia status post emergent repair complicated by multiple intra-abdominal and inguinal abscesses. Prior CT-guided transgluteal drainage catheter placed into the pelvic fluid collection on 12/24/2021. Patient has multiple residual abscess collections and requires additional drain placement. EXAM: CT GUIDED DRAINAGE OF right lower quadrant inguinal ABSCESS CT GUIDED DRAINAGE OF right lower quadrant peritoneal ABSCESS CT GUIDED DRAINAGE OF left lower quadrant peritoneal ABSCESS CT GUIDED DRAINAGE OF midline upper abdominal wall ABSCESS TECHNIQUE: Multidetector CT imaging of the abdomen and pelvis was performed following the standard protocol without IV contrast. RADIATION DOSE REDUCTION: This exam was performed according to the departmental dose-optimization program which includes automated exposure control, adjustment of the mA and/or kV according to patient size and/or use of iterative reconstruction technique. MEDICATIONS: The patient  is currently admitted to the hospital and receiving intravenous antibiotics. The antibiotics were administered within an appropriate time frame prior to the initiation of the procedure. ANESTHESIA/SEDATION: Moderate (conscious) sedation was employed  during this procedure. A total of Versed mg and Fentanyl mcg was administered intravenously by the radiology nurse. Total intra-service moderate Sedation Time: minutes. The patient's level of consciousness and vital signs were monitored continuously by radiology nursing throughout the procedure under my direct supervision. COMPLICATIONS: None immediate. TECHNIQUE: Informed written consent was obtained from the patient after a thorough discussion of the procedural risks, benefits and alternatives. All questions were addressed. Maximal Sterile Barrier Technique was utilized including caps, mask, sterile gowns, sterile gloves, sterile drape, hand hygiene and skin antiseptic. A timeout was performed prior to the initiation of the procedure. PROCEDURE: The operative field was prepped with Chlorhexidine in a sterile fashion, and a sterile drape was applied covering the operative field. A sterile gown and sterile gloves were used for the procedure. Local anesthesia was provided with 1% Lidocaine. CT imaging of the abdomen and pelvis was performed. The fluid collection in the midline anterior abdominal wall was identified as well as the fluid and gas collection in the right inguinal region, the fluid and gas collection in the right lateral inguinal recess, and the gas collection in the left lower quadrant. Suitable skin entry sites were selected and marked. Drain # 1: Right lower quadrant Under intermittent CT guidance, an 18 gauge trocar needle was carefully advanced from a right lateral approach into the fluid and gas collection. A 0.035 Amplatz wire was coiled in the collection. The soft tissue tract was dilated to 2512 [place] and a 12 [place] all-purpose drainage catheter was advanced over the wire and formed. Aspiration yields approximately 20 mL frankly purulent material. Samples were sent for Gram stain and culture. The abscess cavity was flushed and the drainage catheter connected to JP bulb suction before being  secured to the skin with 0 Prolene suture. Drain # 2: Right lateral inguinal recess of the peritoneal cavity Under intermittent CT guidance, an 18 gauge trocar needle was advanced from a right lateral approach into the fluid and gas collection. A 0.035 Amplatz wire was coiled in the collection. The soft tissue tract was dilated to 12 [place] a 12 [place] all-purpose drainage catheter was advanced over the wire and formed. Initially, the drainage catheter became hung up in the anterior abdominal wall musculature in did not completely enter the fluid and gas collection within the peritoneum. Therefore, the drain was removed, reformed and the 18 gauge needle advanced deeper into the fluid and gas collection. This time, a 10 French drainage catheter was successfully advanced into the collection and formed. Aspiration yields approximately 6 mL of bloody purulent fluid. The drain was connected to JP bulb suction and secured to the skin with 0 Prolene suture. Drain # 3: Left lower quadrant gas collection in the perivesicular space extending toward the sigmoid colon Under intermittent CT guidance, an 18 gauge trocar needle was advanced in the gas collection. A 0.035 wire was then advanced. The skin tract was dilated to 12 [place]. A 12 French all-purpose drainage catheter was advanced over the wire and formed. Aspiration yields a small amount of air, but no fluid or purulent fluid. CT imaging demonstrates the drainage catheter where the air pocket was with no residual air. Given that there is no output, the decision was made to remove the drain. The drain was transected and removed. Drain # 4: Midline  abdominal wall Under intermittent CT guidance, an 18 gauge trocar needle was advanced into the fluid collection. A 0.035 wire was advanced and coiled. The skin tract was dilated to 12 [place]. A 12 French all-purpose drainage catheter was advanced over the wire and formed. Aspiration yields approximately 20 mL turbid brown fluid.  Samples were sent for Gram stain and culture. The drainage catheter was secured in place with 0 Prolene suture and connected to JP bulb suction. Final CT imaging was performed confirming placement of the 3 d

## 2022-01-08 NOTE — Progress Notes (Signed)
Mobility Specialist Progress Note: ? ? 01/08/22 1049  ?Mobility  ?Activity Ambulated with assistance in hallway  ?Level of Assistance Independent after set-up  ?Assistive Device None  ?Distance Ambulated (ft) 400 ft  ?Activity Response Tolerated well  ?$Mobility charge 1 Mobility  ? ?Pt received in bed willing to participate in mobility. Complaints of 3/10 abdominal pain. Left in bed with call bell in reach and all needs met.  ? ?Cynthia Morrison ?Mobility Specialist ?Primary Phone [phone] ? ?

## 2022-01-09 LAB — CULTURE, BLOOD (ROUTINE X 2)
Culture: NO GROWTH
Culture: NO GROWTH
Special Requests: ADEQUATE
Special Requests: ADEQUATE

## 2022-01-09 MED ORDER — TRAMADOL HCL 50 MG PO TABS: 50.0000 mg | ORAL_TABLET | Freq: Four times a day (QID) | ORAL | 0 refills | Status: AC | PRN

## 2022-01-09 MED ORDER — METRONIDAZOLE 500 MG PO TABS: 500.0000 mg | ORAL_TABLET | Freq: Three times a day (TID) | ORAL | 0 refills | Status: AC

## 2022-01-09 MED ORDER — SODIUM CHLORIDE 0.9% FLUSH: 10.0000 mL | Freq: Two times a day (BID) | INTRAVENOUS | 4 refills | Status: DC

## 2022-01-09 MED ORDER — LEVOFLOXACIN 750 MG PO TABS: 750.0000 mg | ORAL_TABLET | Freq: Every day | ORAL | 0 refills | Status: AC

## 2022-01-09 NOTE — Progress Notes (Signed)
Patient discharged to home with instructions given to patient and her daughter. Teaching and return demonstrated on how to do drain care, questions answered, some supplies provided. ?

## 2022-01-09 NOTE — Discharge Summary (Signed)
[facility] ?Discharge Summary  ? ?Patient ID: ?Cynthia Morrison ?MRN: [id number] ?DOB/AGE: Mar 30, 1956 66 y.o. ? ?Admit date: 01/04/2022 ?Discharge date: 01/09/2022 ? ?Admitting Diagnosis: ?Postoperative intra-abdominal abscess [T81.43XA] ?Abscess of groin, right [L02.214] ?History of laparoscopic right femoral hernia repair with mesh, exploratory laparotomy small bowel resection with anastomosis for strangulated hernia  Dr. Derrell Lolling 3/28 ?Anterior abdominal wall abscess ? ?Discharge Diagnosis ?Postoperative intra-abdominal abscess [T81.43XA] ?Abscess of groin, right [L02.214] ?History of laparoscopic right femoral hernia repair with mesh, exploratory laparotomy small bowel resection with anastomosis for strangulated hernia  Dr. Derrell Lolling 3/28 ?Anterior abdominal wall abscess ? ?Consultants ?Interventional Radiology ? ?Imaging: ?No results found. ? ?Procedures ?Dr. Archer Asa (01/07/22) - IR drain placement ?Dr. Janee Morn (01/04/2022) - bedside incision and drainage right groin abscess ? ?Hospital Course:  ?66 year old who presented to [facility] ED with right groin swelling.  Workup showed right groin abscess, anterior abdominal wall abscess, persistent intraabdominal abscess in pelvis. She was having regular bowel function and tolerating regular diet without GI complaints. Patient was admitted and underwent bedside incision and drainage and placed on IV antibiotics.  Tolerated procedure well. She did not tolerate bedside wound care/packing of groin abscess and IR was asked to evaluate existing drain as well as possible further drainage. She then underwent IR drain placement x3 as above. She was admitted with prior transgluteal drain still in place and this remains. On date of discharge the patient was voiding well, tolerating diet, ambulating well, pain well controlled, vital signs stable, incisions c/d/I, drains functioning well and felt stable for discharge home.  Patient will follow up in our office in 2 weeks and  knows to call with questions or concerns.   ?She will discharge on 3 additional days of antibiotics - Levaquin/flagyl with final sensitivities pending. ?She will follow up with IR to monitor drains. ? ?Physical Exam: ?General: pleasant, WD, female who is laying in bed in NAD ?Heart: regular rate on monitor. Palpable radial pulses bilaterally ?Lungs: Respiratory effort nonlabored on room air ?Abd: soft, NT, ND ?Right groin abscess I&D site with scant purulent drainage on bandage and no active drainage. Surrounding induration and erythema have resolved. ?Superior RLQ IR drain - serosanguinous ?Inferior RLQ IR drain - cloudy sanguinous ?LLQ/abd IR drain - clear serosanguinous ?L posterior drain - serous ?MSK: all 4 extremities are symmetrical with no cyanosis, clubbing, or edema. ?Skin: warm and dry ?Psych: A&Ox3 with an appropriate affect.  ? ?I or a member of my team have reviewed this patient in the Controlled Substance Database. ? ? ?Allergies as of 01/09/2022   ? ?   Reactions  ? Buspirone Hcl Other (See Comments)  ? hallucinations/ vomitting  ? Codeine Other (See Comments)  ? Nightmares   ? Sertraline Hcl Other (See Comments)  ? GI upset/Vomiting  ? Aleve [naproxen Sodium] Rash  ? Takes Ibuprofen with no issues  ? Sulfamethoxazole-trimethoprim Rash  ? ?  ? ?  ?Medication List  ?  ? ?STOP taking these medications   ? ?amoxicillin-clavulanate 875-125 MG tablet ?Commonly known as: Augmentin ?  ?fluconazole 200 MG tablet ?Commonly known as: Diflucan ?  ? ?  ? ?TAKE these medications   ? ?amLODipine 5 MG tablet ?Commonly known as: NORVASC ?Take 1 tablet (5 mg total) by mouth daily. ?What changed: when to take this ?  ?docusate sodium 100 MG capsule ?Commonly known as: COLACE ?Take 1 capsule (100 mg total) by mouth every 12 (twelve) hours. ?  ?levofloxacin 750 MG tablet ?Commonly  known as: Levaquin ?Take 1 tablet (750 mg total) by mouth daily for 3 days. ?  ?losartan 50 MG tablet ?Commonly known as: COZAAR ?Take 50 mg  by mouth 2 (two) times daily. ?  ?metroNIDAZOLE 500 MG tablet ?Commonly known as: Flagyl ?Take 1 tablet (500 mg total) by mouth 3 (three) times daily for 3 days. ?  ?ondansetron 4 MG disintegrating tablet ?Commonly known as: ZOFRAN-ODT ?Take 1 tablet (4 mg total) by mouth every 8 (eight) hours as needed for nausea or vomiting. ?  ?polyethylene glycol 17 g packet ?Commonly known as: MiraLax ?Take 17 g by mouth daily. ?  ?sodium chloride flush 0.9 % Soln ?Commonly known as: NS ?10 mLs by Intracatheter route every 12 (twelve) hours. ?What changed:  ?medication strength ?how much to take ?when to take this ?  ?traMADol 50 MG tablet ?Commonly known as: ULTRAM ?Take 1 tablet (50 mg total) by mouth every 6 (six) hours as needed for up to 5 days for moderate pain. ?  ? ?  ? ?  ?  ? ? ?  ?Durable Medical Equipment  ?(From admission, onward)  ?  ? ? ?  ? ?  Start     Ordered  ? 01/07/22 1628  For home use only DME Other see comment  Once       ?Comments: gauze and tape ,  Drain gauze, 4x4's normal saline and 5 cc syringes for flushes ? ?Call patient's daughter Erie Noe [phone] for address/ delivery ( different then what is in Epic)  ?Question:  Length of Need  Answer:  6 Months  ? 01/07/22 1628  ? ?  ?  ? ?  ? ? ? ? Follow-up Information   ? ? [employer] Follow up.   ?Why: [employer], the company your dressing supplies were ordered through ?Contact information: ?[address] ?[place] [address] ?[phone] ? ? ?  ?  ? ? Sterling Big, MD Follow up.   ?Specialties: Interventional Radiology, Radiology ?Why: Schedulers will call with appt date and time. ?Contact information: ?[address] ?[address] ?[place] [address] ?[phone] ? ? ?  ?  ? ? Axel Filler, MD. Go on 01/27/2022.   ?Specialty: General Surgery ?Why: follow up on 01/27/22 at 4:20 pm. Please arrive 30 minutes early to complete check in process and bring photo ID and insurance card if you have them ?Contact information: ?[address] ?[address] ?[place] [address] ?[phone] ? ? ?  ?  ? ?  ?  ? ?  ? ? ?Signed: ?Eric Form , PA-C ?[facility] ?01/09/2022, 12:05 PM ?Please see Amion for pager number during day hours 7:00am-4:30pm ? ? ?  ?

## 2022-01-09 NOTE — Progress Notes (Signed)
Mobility Specialist Progress Note: ? ? 01/09/22 1029  ?Mobility  ?Activity Ambulated with assistance in hallway  ?Level of Assistance Independent after set-up  ?Distance Ambulated (ft) 400 ft  ?Activity Response Tolerated well  ?$Mobility charge 1 Mobility  ? ?Pt received in bed willing to participate in mobility. No complaints of pain. Left in bed with call bell in reach and all needs met.  ? ?Cynthia Morrison ?Mobility Specialist ?Primary Phone [phone] ? ?

## 2022-01-09 NOTE — Discharge Instructions (Signed)
Flush drains daily with 5-10 mL sterile saline. Dressing changes every 3 days or as needed if soiled/wet and record output once daily ?

## 2022-01-10 LAB — AEROBIC/ANAEROBIC CULTURE W GRAM STAIN (SURGICAL/DEEP WOUND)

## 2022-01-12 LAB — AEROBIC/ANAEROBIC CULTURE W GRAM STAIN (SURGICAL/DEEP WOUND)

## 2022-01-15 ENCOUNTER — Other Ambulatory Visit (HOSPITAL_COMMUNITY): Payer: Self-pay

## 2022-01-15 MED ORDER — NORMAL SALINE FLUSH 0.9 % IV SOLN
INTRAVENOUS | 1 refills | Status: AC
  Filled 2022-01-15: qty 560, 28d supply, fill #0

## 2022-01-17 DIAGNOSIS — L02214 Cutaneous abscess of groin: Secondary | ICD-10-CM | POA: Diagnosis not present

## 2022-01-20 DIAGNOSIS — L02214 Cutaneous abscess of groin: Secondary | ICD-10-CM | POA: Diagnosis not present

## 2022-01-21 ENCOUNTER — Other Ambulatory Visit (HOSPITAL_COMMUNITY): Payer: Self-pay

## 2022-01-21 ENCOUNTER — Ambulatory Visit
Admission: RE | Admit: 2022-01-21 | Discharge: 2022-01-21 | Disposition: A | Discharge status: Home / Self Care | Payer: BC Managed Care – PPO | Source: Ambulatory Visit | Attending: General Surgery | Admitting: General Surgery

## 2022-01-21 ENCOUNTER — Encounter: Payer: Self-pay | Admitting: *Deleted

## 2022-01-21 ENCOUNTER — Ambulatory Visit
Admission: RE | Admit: 2022-01-21 | Discharge: 2022-01-21 | Disposition: A | Discharge status: Home / Self Care | Payer: BC Managed Care – PPO | Source: Ambulatory Visit | Attending: Student | Admitting: Student

## 2022-01-21 ENCOUNTER — Other Ambulatory Visit: Payer: Self-pay | Admitting: General Surgery

## 2022-01-21 DIAGNOSIS — K651 Peritoneal abscess: Secondary | ICD-10-CM

## 2022-01-21 DIAGNOSIS — K632 Fistula of intestine: Secondary | ICD-10-CM | POA: Diagnosis not present

## 2022-01-21 DIAGNOSIS — T8143XA Infection following a procedure, organ and space surgical site, initial encounter: Secondary | ICD-10-CM

## 2022-01-21 DIAGNOSIS — Z4682 Encounter for fitting and adjustment of non-vascular catheter: Secondary | ICD-10-CM | POA: Diagnosis not present

## 2022-01-21 DIAGNOSIS — K6389 Other specified diseases of intestine: Secondary | ICD-10-CM | POA: Diagnosis not present

## 2022-01-21 DIAGNOSIS — K5730 Diverticulosis of large intestine without perforation or abscess without bleeding: Secondary | ICD-10-CM | POA: Diagnosis not present

## 2022-01-21 HISTORY — PX: IR RADIOLOGIST EVAL & MGMT: IMG5224

## 2022-01-21 MED ORDER — IOPAMIDOL (ISOVUE-300) INJECTION 61%
100.0000 mL | Freq: Once | INTRAVENOUS | Status: AC | PRN
  Administered 2022-01-21: 100 mL via INTRAVENOUS

## 2022-01-21 NOTE — Progress Notes (Signed)
? ?Referring Physician(s): ?Han,Aimee H ? ?Chief Complaint: ?The patient is seen in follow up today s/p right inguinal hernia repair with post-op fluid collections/abscesses status post drain placements in IR 12/24/21 (1 drain, Dr. Milford CageMugweru) and 01/07/22 (3 drains, Dr. Archer AsaMcCullough). ? ?History of present illness: ? ?Cynthia Morrison, 66 year old female, has a medical history significant for an  incarcerated right femoral hernia first identified 12/17/21 when she presented to the ED with abdominal pain, nausea and vomiting. She was taken emergently to the OR 12/17/21 and underwent hernia repair and small bowel resection with anastomosis. Her recovery was complicated by worsening leukocytosis and abdominal pain. CT 12/23/21 showed an air-fluid collection in the right inguinal region and on 12/24/21 she was seen in IR for an abscess drain placement - left transgluteal approach. She was discharged home 12/26/21. ? ?She returned to the ED 01/04/22 for evaluation of right groin swelling/pain and CT was positive for multiple air-fluid collections. Prior drain placed 12/24/21 was still in place. She was seen in IR 01/07/22 and she received three new drains -  ? ?#1 RLQ, 12 Fr. Bulb ?#2 Right lateral inguinal recess of the peritoneal cavity, 10 Fr. Bulb  ?#3 LLQ gas collection - drain placed and subsequently removed during the procedure due to no output.  ?#4 LLQ/Midline abdominal wall, 12 Fr. Bulb ? ?The original left transgluteal drain was left in place and the patient was discharged home 01/09/22.  ? ?[facility] for evaluation of the four drains with CT imaging and possible drain injections. She denies fevers, chills, diarrhea, nausea or vomiting. She reports moderate discomfort at each drain site but states the right-sided drains are the most uncomfortable. Her daughter flushes each drain with 10 ml daily and she reports a daily output of approximately 30 ml from each drain. She is not  taking any oral antibiotics and she has a follow up appointment with Surgery scheduled for 01/27/22.   ? ?Past Medical History:  ?Diagnosis Date  ? Hypertension   ? ? ?Past Surgical History:  ?Procedure Laterality Date  ? BACK SURGERY    ? GANGLION CYST EXCISION Right   ? INGUINAL HERNIA REPAIR Right 12/17/2021  ? Procedure: LAPAROSCOPIC RIGHT FEMORAL HERNIA REPAIR WITH MESH;  Surgeon: Axel Filleramirez, Armando, MD;  [facility];  Service: General;  Laterality: Right;  ? INGUINAL HERNIA REPAIR Right 12/17/2021  ? Procedure: EXPLORATORY LAPAROTOMY SMALL BOWEL RESECTION WITH ANASTOMOSIS;  Surgeon: Axel Filleramirez, Armando, MD;  [facility];  Service: General;  Laterality: Right;  ? WISDOM TOOTH EXTRACTION    ? ? ?Allergies: ?Buspirone hcl, Codeine, Sertraline hcl, Aleve [naproxen sodium], and Sulfamethoxazole-trimethoprim ? ?Medications: ?Prior to Admission medications   ?Medication Sig Start Date End Date Taking? Authorizing Provider  ?amLODipine (NORVASC) 5 MG tablet Take 1 tablet (5 mg total) by mouth daily. ?Patient taking differently: Take 5 mg by mouth in the morning and at bedtime. 06/30/19   Jodelle Redhristopher, Bridgette, MD  ?docusate sodium (COLACE) 100 MG capsule Take 1 capsule (100 mg total) by mouth every 12 (twelve) hours. ?Patient not taking: Reported on 12/18/2021 12/13/21   Valentino NoseMartinez, Jessica A, NP  ?losartan (COZAAR) 50 MG tablet Take 50 mg by mouth 2 (two) times daily. 06/23/19   [provider]  ?ondansetron (ZOFRAN-ODT) 4 MG disintegrating tablet Take 1 tablet (4 mg total) by mouth every 8 (eight) hours as needed for nausea or vomiting. 12/13/21   Valentino NoseMartinez, Jessica A, NP  ?polyethylene glycol (MIRALAX) 17 g  packet Take 17 g by mouth daily. ?Patient not taking: Reported on 12/18/2021 12/13/21   Valentino Nose, NP  ?Sodium Chloride Flush (NORMAL SALINE FLUSH) 0.9 % SOLN Flush with in each drain 2 times a day 01/15/22   Axel Filler, MD  ?sodium chloride flush (NS) 0.9 % SOLN 10 mLs by Intracatheter route  every 12 (twelve) hours. 01/09/22   Eric Form, PA-C  ?  ? ?Family History  ?Problem Relation Age of Onset  ? Hypertension Mother   ? Heart failure Father   ? Pancreatic cancer Father   ? ? ?Social History  ? ?Socioeconomic History  ? Marital status: Single  ?  Spouse name: Not on file  ? Number of children: Not on file  ? Years of education: Not on file  ? Highest education level: Not on file  ?Occupational History  ? Not on file  ?Tobacco Use  ? Smoking status: Former  ? Smokeless tobacco: Never  ?Substance and Sexual Activity  ? Alcohol use: Yes  ? Drug use: No  ? Sexual activity: Not on file  ?Other Topics Concern  ? Not on file  ?Social History Narrative  ? Not on file  ? ?Social Determinants of Health  ? ?Financial Resource Strain: Not on file  ?Food Insecurity: Not on file  ?Transportation Needs: Not on file  ?Physical Activity: Not on file  ?Stress: Not on file  ?Social Connections: Not on file  ? ? ? ?Vital Signs: ?There were no vitals taken for this visit. ? ?Physical Exam ?Constitutional:   ?   General: She is not in acute distress. ?Pulmonary:  ?   Effort: Pulmonary effort is normal.  ?Abdominal:  ?   Tenderness: There is abdominal tenderness.  ?   Comments: Drains x 4 ? ?Right inguinal to suction, approximately 20 ml of tan/purulent fluid in bulb  ?Right lower quadrant to suction, approximately 20 ml of tan/purulent fluid in bulb ?Left lower quadrant to suction, approximately 20 ml of serous fluid in bulb  ?Left transgluteal to suction, approximately 20 ml of tan/white fluid in bulb ? ?All skin insertion sites are clean and dry. Sutures all intact.   ?Musculoskeletal:     ?   General: Normal range of motion.  ?Skin: ?   General: Skin is warm and dry.  ?Neurological:  ?   Mental Status: She is alert and oriented to person, place, and time.  ? ? ?Imaging: ?No results found. ? ?Labs: ? ?CBC: ?Recent Labs  ?  12/26/21 ?0115 01/04/22 ?1808 01/05/22 ?0263 01/06/22 ?0449  ?WBC 15.5* 9.2 7.9 7.9  ?HGB  13.5 13.4 9.1* 10.2*  ?HCT 39.2 38.9 26.7* 31.5*  ?PLT 167 346 244 258  ? ? ?COAGS: ?Recent Labs  ?  12/17/21 ?1348 01/04/22 ?1808 01/05/22 ?0540  ?INR 1.1 1.2  --   ?APTT 23*  --  32  ? ? ?BMP: ?Recent Labs  ?  12/25/21 ?0143 12/26/21 ?0115 01/04/22 ?1808 01/05/22 ?0540  ?NA 132* 132* 129* 133*  ?K 3.4* 4.1 2.7* 3.6  ?CL 101 100 94* 103  ?CO2 24 22 23 24   ?GLUCOSE 92 84 109* 88  ?BUN 10 7* 6* 5*  ?CALCIUM 7.5* 7.5* 7.9* 7.0*  ?CREATININE 0.86 0.86 0.80 0.62  ?GFRNONAA >60 >60 >60 >60  ? ? ?LIVER FUNCTION TESTS: ?Recent Labs  ?  12/17/21 ?1540 12/18/21 ?0129 12/24/21 ?0111 01/04/22 ?1808  ?BILITOT 0.4 0.5 1.0 0.6  ?AST 38 29 16 20   ?ALT  39 31 10 10   ?ALKPHOS 60 54 55 59  ?PROT 5.9* 5.4* 4.7* 6.3*  ?ALBUMIN 2.8* 2.8* 1.6* 2.1*  ? ? ?Assessment: ? ?Right inguinal hernia repair with post-op fluid collections/abscesses status post drain placements in IR 12/24/21 (1 drain, Dr. 02/23/22) and 01/07/22 (3 drains, Dr. 01/09/22). ? ?Today's CT imaging shows resolution of all fluid collections. Please see full report under Imaging tab. All anterior drains were injected with contrast under fluoroscopy - the right inguinal and right lower quadrant drains appear to be in connection with one another in the same space, no fistula identified. The left lower quadrant drain did not demonstrate a fistulous connection to the bowel.  ? ?After evaluation/discussion with Dr. Archer Asa, decision was made to remove the anterior LLQ drain and this site was covered with gauze/tape. The three remaining drains (right inguinal, right lower quadrant and left transgluteal) were switched to gravity bags and the patient was instructed to discontinue flushing. We will plan to see the patient back in approximately one week to evaluate the three remaining drains. The patient's daughter was updated with today's findings and the new instructions. The patient and her daughter know to continue documenting the daily output, discontinue flushing the drains, change  the dressings daily or as needed and to keep the sites clean and dry. They know they can call the clinic with any questions/concerns.  ? ?Signed: ?Grace Isaac, NP ?01/21/2022, 2:27 PM ? ? ?Please r

## 2022-01-28 ENCOUNTER — Encounter: Payer: Self-pay | Admitting: Radiology

## 2022-01-28 ENCOUNTER — Ambulatory Visit
Admission: RE | Admit: 2022-01-28 | Discharge: 2022-01-28 | Disposition: A | Discharge status: Home / Self Care | Payer: BC Managed Care – PPO | Source: Ambulatory Visit | Attending: Student | Admitting: Student

## 2022-01-28 ENCOUNTER — Ambulatory Visit
Admission: RE | Admit: 2022-01-28 | Discharge: 2022-01-28 | Disposition: A | Discharge status: Home / Self Care | Payer: BC Managed Care – PPO | Source: Ambulatory Visit | Attending: General Surgery | Admitting: General Surgery

## 2022-01-28 ENCOUNTER — Other Ambulatory Visit: Payer: Self-pay | Admitting: General Surgery

## 2022-01-28 DIAGNOSIS — T8143XA Infection following a procedure, organ and space surgical site, initial encounter: Secondary | ICD-10-CM

## 2022-01-28 DIAGNOSIS — K632 Fistula of intestine: Secondary | ICD-10-CM | POA: Diagnosis not present

## 2022-01-28 DIAGNOSIS — Z4682 Encounter for fitting and adjustment of non-vascular catheter: Secondary | ICD-10-CM | POA: Diagnosis not present

## 2022-01-28 HISTORY — PX: IR RADIOLOGIST EVAL & MGMT: IMG5224

## 2022-01-28 NOTE — Progress Notes (Signed)
? ?Referring Physician(s): ?Matthews,Kacie Sue-Ellen ? ?Chief Complaint: ?The patient is seen in follow up today s/p multiple abdominal abscess drain placement ? ?History of present illness: ? ?66 year old woman with history of incarcerated right inguinal hernia, post emergent repair complicated by multiple intra-abdominal and inguinal abscesses, post CT-guided placement of left trans gluteal approach drainage catheter on 12/24/2021 as well as placement of additional ventral abdominal wall abscess drainage catheter, right inguinal and right lower quadrant abscess drainage catheters on 01/07/2022. ? ?Vental abdominal drain was removed on 01/21/2022. ? ?She and her daughter report minimal output from the remaining three drains since 01/21/2022. ? ?She denies fever, chills, and worsening pain. ? ?Past Medical History:  ?Diagnosis Date  ? Hypertension   ? ? ?Past Surgical History:  ?Procedure Laterality Date  ? BACK SURGERY    ? GANGLION CYST EXCISION Right   ? INGUINAL HERNIA REPAIR Right 12/17/2021  ? Procedure: LAPAROSCOPIC RIGHT FEMORAL HERNIA REPAIR WITH MESH;  Surgeon: Ralene Ok, MD;  Location: [facility];  Service: General;  Laterality: Right;  ? INGUINAL HERNIA REPAIR Right 12/17/2021  ? Procedure: EXPLORATORY LAPAROTOMY SMALL BOWEL RESECTION WITH ANASTOMOSIS;  Surgeon: Ralene Ok, MD;  Location: [facility];  Service: General;  Laterality: Right;  ? IR RADIOLOGIST EVAL & MGMT  01/21/2022  ? IR RADIOLOGIST EVAL & MGMT  01/28/2022  ? WISDOM TOOTH EXTRACTION    ? ? ?Allergies: ?Buspirone hcl, Codeine, Sertraline hcl, Aleve [naproxen sodium], and Sulfamethoxazole-trimethoprim ? ?Medications: ?Prior to Admission medications   ?Medication Sig Start Date End Date Taking? Authorizing Provider  ?amLODipine (NORVASC) 5 MG tablet Take 1 tablet (5 mg total) by mouth daily. ?Patient taking differently: Take 5 mg by mouth in the morning and at bedtime. 06/30/19   Buford Dresser, MD  ?docusate sodium (COLACE) 100 MG capsule  Take 1 capsule (100 mg total) by mouth every 12 (twelve) hours. ?Patient not taking: Reported on 12/18/2021 12/13/21   Eulogio Bear, NP  ?losartan (COZAAR) 50 MG tablet Take 50 mg by mouth 2 (two) times daily. 06/23/19   [provider]  ?ondansetron (ZOFRAN-ODT) 4 MG disintegrating tablet Take 1 tablet (4 mg total) by mouth every 8 (eight) hours as needed for nausea or vomiting. 12/13/21   Eulogio Bear, NP  ?polyethylene glycol (MIRALAX) 17 g packet Take 17 g by mouth daily. ?Patient not taking: Reported on 12/18/2021 12/13/21   Eulogio Bear, NP  ?Sodium Chloride Flush (NORMAL SALINE FLUSH) 0.9 % SOLN Flush with 22mls in each drain 2 times a day 01/15/22   Ralene Ok, MD  ?sodium chloride flush (NS) 0.9 % SOLN 10 mLs by Intracatheter route every 12 (twelve) hours. 01/09/22   Winferd Humphrey, PA-C  ?  ? ?Family History  ?Problem Relation Age of Onset  ? Hypertension Mother   ? Heart failure Father   ? Pancreatic cancer Father   ? ? ?Social History  ? ?Socioeconomic History  ? Marital status: Single  ?  Spouse name: Not on file  ? Number of children: Not on file  ? Years of education: Not on file  ? Highest education level: Not on file  ?Occupational History  ? Not on file  ?Tobacco Use  ? Smoking status: Former  ? Smokeless tobacco: Never  ?Substance and Sexual Activity  ? Alcohol use: Yes  ? Drug use: No  ? Sexual activity: Not on file  ?Other Topics Concern  ? Not on file  ?Social History Narrative  ? Not  on file  ? ?Social Determinants of Health  ? ?Financial Resource Strain: Not on file  ?Food Insecurity: Not on file  ?Transportation Needs: Not on file  ?Physical Activity: Not on file  ?Stress: Not on file  ?Social Connections: Not on file  ? ? ? ?Vital Signs: ?There were no vitals taken for this visit. ? ?Physical Exam ? ?Imaging: ?DG Sinus/Fist Tube Chk-Non GI ? ?Result Date: 01/28/2022 ?INDICATION: 66 year old woman with history of incarcerated right inguinal hernia status post  emergent repair, complicated by multiple intra-abdominal and inguinal abscesses. Transgluteal abscess drain was placed with CT guidance on 12/24/2021. Three additional drains were placed on 01/07/2022, including right inguinal, right lower quadrant abdomen, and ventral abdominal wall. The ventral abdominal drain was removed after abscessogram on 01/21/2022. The patient reports minimal output from all 3 remaining drains since last abscessogram on 01/21/2022. EXAM: 1. Right lower quadrant abscessogram 2. Right inguinal abscessogram 3. Left transgluteal abscessogram MEDICATIONS: None ANESTHESIA/SEDATION: None COMPLICATIONS: None immediate. PROCEDURE: Informed written consent was obtained from the patient after a thorough discussion of the procedural risks, benefits and alternatives. Scout image demonstrates all 3 drains in appropriate position. Contrast administered through the right lower quadrant drain demonstrates no significant residual cavity and no fistulous communication. Drain cut and removed. Contrast administered through the right inguinal drain demonstrates no significant residual cavity and no fistulous communication. Drain cut and removed. Contrast administered through the left transgluteal drain demonstrates continued fistulous communication with adjacent bowel loop. Drain flushed and reattached to bag. IMPRESSION: 1. Right lower quadrant and right inguinal abscessogram demonstrates minimal residual cavity with no fistulous communication. Given minimal output since 01/21/2022, these drains were removed. 2. Although there has been minimal output from the left transgluteal drain, fistulous communication adjacent bowel is still present, therefore drain was left in place. The drain is attached to a bag. Patient has been instructed to not flush the drain. PLAN: Return in 2 weeks for abscessogram. Electronically Signed   By: Miachel Roux M.D.   On: 01/28/2022 14:36  ? ?IR Radiologist Eval & Mgmt ? ?Result Date:  01/28/2022 ?Please refer to notes tab for details about interventional procedure. (Op Note)  ? ?Labs: ? ?CBC: ?Recent Labs  ?  12/26/21 ?0115 01/04/22 ?1808 01/05/22 ?[id number] 01/06/22 ?0449  ?WBC 15.5* 9.2 7.9 7.9  ?HGB 13.5 13.4 9.1* 10.2*  ?HCT 39.2 38.9 26.7* 31.5*  ?PLT 167 346 244 258  ? ? ?COAGS: ?Recent Labs  ?  12/17/21 ?1348 01/04/22 ?1808 01/05/22 ?0540  ?INR 1.1 1.2  --   ?APTT 23*  --  32  ? ? ?BMP: ?Recent Labs  ?  12/25/21 ?0143 12/26/21 ?0115 01/04/22 ?1808 01/05/22 ?0540  ?NA 132* 132* 129* 133*  ?K 3.4* 4.1 2.7* 3.6  ?CL 101 100 94* 103  ?CO2 24 22 23 24   ?GLUCOSE 92 84 109* 88  ?BUN 10 7* 6* 5*  ?CALCIUM 7.5* 7.5* 7.9* 7.0*  ?CREATININE 0.86 0.86 0.80 0.62  ?GFRNONAA >60 >60 >60 >60  ? ? ?LIVER FUNCTION TESTS: ?Recent Labs  ?  12/17/21 ?1540 12/18/21 ?0129 12/24/21 ?0111 01/04/22 ?1808  ?BILITOT 0.4 0.5 1.0 0.6  ?AST 38 29 16 20   ?ALT 39 31 10 10   ?ALKPHOS 60 54 55 59  ?PROT 5.9* 5.4* 4.7* 6.3*  ?ALBUMIN 2.8* 2.8* 1.6* 2.1*  ? ? ?Assessment and Plan: ? ?66 year old woman with history of incarcerated right inguinal hernia, post emergent repair complicated by multiple intra-abdominal and inguinal abscesses, post CT-guided placement  of left trans gluteal approach drainage catheter on 12/24/2021 as well as placement of additional ventral abdominal wall abscess drainage catheter, right inguinal and right lower quadrant abscess drainage catheters on 01/07/2022. ? ?Ventral abdominal drain was removed on 01/21/2022. ? ?She and her daughter report minimal output from the remaining three drains.  Abscessogram performed through the right lower quadrant and right inguinal drain demonstrates no significant residual cavity and no fistulous communication.  Both right sided drains were removed. ? ?Transgluteal abscessogram demonstrates continued fistulous communication with adjacent bowel, so the drain was left in place.   ? ?Plan: ?-Remaining transgluteal drain should remain to bag. ?-Patient instructed to not flush the  day ?-Return to IR clinic in 2 weeks for abscessogram.  ? ? ?Electronically Signed: ?Paula Libra Tymier Lindholm ?01/28/2022, 3:54 PM ? ? ?I spent a total of 10 Minutes in face to face in clinical consultation, greater

## 2022-02-03 ENCOUNTER — Ambulatory Visit: Payer: Self-pay | Admitting: General Surgery

## 2022-02-12 ENCOUNTER — Encounter: Payer: Self-pay | Admitting: Radiology

## 2022-02-12 ENCOUNTER — Ambulatory Visit
Admission: RE | Admit: 2022-02-12 | Discharge: 2022-02-12 | Disposition: A | Discharge status: Home / Self Care | Payer: BC Managed Care – PPO | Source: Ambulatory Visit | Attending: General Surgery | Admitting: General Surgery

## 2022-02-12 DIAGNOSIS — T8143XA Infection following a procedure, organ and space surgical site, initial encounter: Secondary | ICD-10-CM

## 2022-02-12 DIAGNOSIS — K632 Fistula of intestine: Secondary | ICD-10-CM | POA: Diagnosis not present

## 2022-02-12 HISTORY — PX: IR RADIOLOGIST EVAL & MGMT: IMG5224

## 2022-02-25 ENCOUNTER — Other Ambulatory Visit (HOSPITAL_COMMUNITY): Payer: Self-pay | Admitting: General Surgery

## 2022-02-25 DIAGNOSIS — T8143XA Infection following a procedure, organ and space surgical site, initial encounter: Secondary | ICD-10-CM

## 2022-02-26 ENCOUNTER — Telehealth (HOSPITAL_COMMUNITY): Payer: Self-pay

## 2022-02-26 NOTE — Telephone Encounter (Signed)
-----   Message from Corrie Mckusick, DO sent at 02/26/2022 12:42 PM EDT ----- Regarding: RE: drain exchange/downsize Caryl Pina thank you.  Yes she can come to radiology directly for drain exchange.  No sedation needed.   JW  ----- Message ----- From: Danielle Dess Sent: 02/25/2022  10:07 AM EDT To: Corrie Mckusick, DO Subject: drain exchange/downsize                        Earleen Newport,   You saw this patient in clinic and recommended downsizing her drain. She wanted to wait until she saw her surgeon. She is now ready to schedule. Should I set this up with sedation or can she come straight to radiology?  Thanks,  Lia Foyer

## 2022-03-04 ENCOUNTER — Ambulatory Visit (HOSPITAL_COMMUNITY)
Admission: RE | Admit: 2022-03-04 | Discharge: 2022-03-04 | Disposition: A | Discharge status: Home / Self Care | Payer: BC Managed Care – PPO | Source: Ambulatory Visit | Attending: General Surgery | Admitting: General Surgery

## 2022-03-04 DIAGNOSIS — T8143XA Infection following a procedure, organ and space surgical site, initial encounter: Secondary | ICD-10-CM | POA: Insufficient documentation

## 2022-03-04 DIAGNOSIS — K632 Fistula of intestine: Secondary | ICD-10-CM | POA: Diagnosis not present

## 2022-03-04 DIAGNOSIS — Z4803 Encounter for change or removal of drains: Secondary | ICD-10-CM | POA: Diagnosis not present

## 2022-03-04 DIAGNOSIS — Y839 Surgical procedure, unspecified as the cause of abnormal reaction of the patient, or of later complication, without mention of misadventure at the time of the procedure: Secondary | ICD-10-CM | POA: Diagnosis not present

## 2022-03-04 HISTORY — PX: IR CATHETER TUBE CHANGE: IMG717

## 2022-03-04 MED ORDER — IOHEXOL 300 MG/ML  SOLN
100.0000 mL | Freq: Once | INTRAMUSCULAR | Status: AC | PRN
  Administered 2022-03-04: 10 mL

## 2022-03-04 MED ORDER — LIDOCAINE HCL 1 % IJ SOLN
INTRAMUSCULAR | Status: AC
  Administered 2022-03-04: 10 mL
  Filled 2022-03-04: qty 20

## 2022-03-04 NOTE — Procedures (Signed)
Interventional Radiology Procedure Note   66 year old woman with history of incarcerated right inguinal hernia, post emergent repair complicated by multiple intra-abdominal and inguinal abscesses, post CT-guided placement of left trans gluteal approach drainage catheter on 12/24/2021 as well as placement of additional ventral abdominal wall abscess drainage catheter, right inguinal and right lower quadrant abscess drainage catheters on 01/07/2022.  Seen in the clinic 5/24 with injection. Fistula persisted.  We set her up for drain change and downsize.  Procedure: Exchange of transgluteal drain, downsize to 38F  Findings: Small fistula persists   Complications: None  Recommendations:  - Follow up in VIR clinic, possible drain injection in 2 weeks.  Possible removal if output is decreased below 20cc/day.  No CT needed - Do not submerge - Routine wound care   Signed,  Yvone Neu. Loreta Ave, DO

## 2022-03-18 ENCOUNTER — Other Ambulatory Visit: Payer: Self-pay | Admitting: General Surgery

## 2022-03-18 DIAGNOSIS — L0231 Cutaneous abscess of buttock: Secondary | ICD-10-CM

## 2022-03-21 ENCOUNTER — Ambulatory Visit
Admission: RE | Admit: 2022-03-21 | Discharge: 2022-03-21 | Disposition: A | Discharge status: Home / Self Care | Payer: BC Managed Care – PPO | Source: Ambulatory Visit | Attending: General Surgery | Admitting: General Surgery

## 2022-03-21 ENCOUNTER — Other Ambulatory Visit: Payer: Self-pay | Admitting: General Surgery

## 2022-03-21 DIAGNOSIS — L0231 Cutaneous abscess of buttock: Secondary | ICD-10-CM

## 2022-03-21 DIAGNOSIS — K632 Fistula of intestine: Secondary | ICD-10-CM | POA: Diagnosis not present

## 2022-03-21 HISTORY — PX: IR RADIOLOGIST EVAL & MGMT: IMG5224

## 2022-04-10 ENCOUNTER — Ambulatory Visit
Admission: RE | Admit: 2022-04-10 | Discharge: 2022-04-10 | Disposition: A | Discharge status: Home / Self Care | Payer: BC Managed Care – PPO | Source: Ambulatory Visit | Attending: General Surgery | Admitting: General Surgery

## 2022-04-10 ENCOUNTER — Other Ambulatory Visit: Payer: Self-pay | Admitting: General Surgery

## 2022-04-10 ENCOUNTER — Other Ambulatory Visit: Payer: Self-pay | Admitting: Student

## 2022-04-10 DIAGNOSIS — L02214 Cutaneous abscess of groin: Secondary | ICD-10-CM

## 2022-04-10 DIAGNOSIS — I700 Atherosclerosis of aorta: Secondary | ICD-10-CM | POA: Diagnosis not present

## 2022-04-10 DIAGNOSIS — L0231 Cutaneous abscess of buttock: Secondary | ICD-10-CM

## 2022-04-10 DIAGNOSIS — T8183XA Persistent postprocedural fistula, initial encounter: Secondary | ICD-10-CM | POA: Diagnosis not present

## 2022-04-10 DIAGNOSIS — K5730 Diverticulosis of large intestine without perforation or abscess without bleeding: Secondary | ICD-10-CM | POA: Diagnosis not present

## 2022-04-10 DIAGNOSIS — T8143XA Infection following a procedure, organ and space surgical site, initial encounter: Secondary | ICD-10-CM

## 2022-04-10 HISTORY — PX: IR RADIOLOGIST EVAL & MGMT: IMG5224

## 2022-04-10 MED ORDER — IOPAMIDOL (ISOVUE-300) INJECTION 61%
100.0000 mL | Freq: Once | INTRAVENOUS | Status: AC | PRN
  Administered 2022-04-10: 100 mL via INTRAVENOUS

## 2022-04-10 NOTE — Progress Notes (Signed)
Received a critical result from CT pelvis 04/10/2022.  Spoke to Dr. Deanne Coffer regarding the findings.  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  Spoke to a Careers adviser in the office who recommends drain placement in the right groin abscess.  Order placed in [facility] epic.

## 2022-04-11 ENCOUNTER — Other Ambulatory Visit: Payer: BC Managed Care – PPO

## 2022-04-11 ENCOUNTER — Other Ambulatory Visit (HOSPITAL_COMMUNITY): Payer: Self-pay | Admitting: Physician Assistant

## 2022-04-11 ENCOUNTER — Telehealth (HOSPITAL_COMMUNITY): Payer: Self-pay | Admitting: Radiology

## 2022-04-11 DIAGNOSIS — Z01818 Encounter for other preprocedural examination: Secondary | ICD-10-CM

## 2022-04-11 NOTE — Telephone Encounter (Signed)
Called pt, no answer and no VM. Called pt's daughter, left message for her to call back to schedule pt for STAT drain per DDH. JM

## 2022-04-14 ENCOUNTER — Ambulatory Visit (HOSPITAL_COMMUNITY)
Admission: RE | Admit: 2022-04-14 | Discharge: 2022-04-14 | Disposition: A | Discharge status: Home / Self Care | Payer: BC Managed Care – PPO | Source: Ambulatory Visit | Attending: Student | Admitting: Student

## 2022-04-14 ENCOUNTER — Other Ambulatory Visit: Payer: Self-pay

## 2022-04-14 DIAGNOSIS — L02214 Cutaneous abscess of groin: Secondary | ICD-10-CM | POA: Diagnosis not present

## 2022-04-14 DIAGNOSIS — K668 Other specified disorders of peritoneum: Secondary | ICD-10-CM | POA: Diagnosis not present

## 2022-04-14 DIAGNOSIS — Z01818 Encounter for other preprocedural examination: Secondary | ICD-10-CM

## 2022-04-14 LAB — CBC
HCT: 46.6 % — ABNORMAL HIGH (ref 36.0–46.0)
Hemoglobin: 15.4 g/dL — ABNORMAL HIGH (ref 12.0–15.0)
MCH: 32.1 pg (ref 26.0–34.0)
MCHC: 33 g/dL (ref 30.0–36.0)
MCV: 97.1 fL (ref 80.0–100.0)
Platelets: 350 K/uL (ref 150–400)
RBC: 4.8 MIL/uL (ref 3.87–5.11)
RDW: 12.9 % (ref 11.5–15.5)
WBC: 10.5 K/uL (ref 4.0–10.5)
nRBC: 0 % (ref 0.0–0.2)

## 2022-04-14 MED ORDER — MIDAZOLAM HCL 2 MG/2ML IJ SOLN
INTRAMUSCULAR | Status: AC | PRN
  Administered 2022-04-14 (×2): 1 mg via INTRAVENOUS

## 2022-04-14 MED ORDER — FENTANYL CITRATE (PF) 100 MCG/2ML IJ SOLN
INTRAMUSCULAR | Status: AC | PRN
  Administered 2022-04-14 (×2): 50 ug via INTRAVENOUS

## 2022-04-14 MED ORDER — MIDAZOLAM HCL 2 MG/2ML IJ SOLN
INTRAMUSCULAR | Status: AC
  Filled 2022-04-14: qty 2

## 2022-04-14 MED ORDER — SODIUM CHLORIDE 0.9 % IV SOLN: INTRAVENOUS | Status: DC

## 2022-04-14 MED ORDER — LIDOCAINE HCL 1 % IJ SOLN
INTRAMUSCULAR | Status: AC
  Filled 2022-04-14: qty 10

## 2022-04-14 MED ORDER — FENTANYL CITRATE (PF) 100 MCG/2ML IJ SOLN
INTRAMUSCULAR | Status: AC
  Filled 2022-04-14: qty 2

## 2022-04-14 NOTE — Procedures (Signed)
Interventional Radiology Procedure Note  Procedure: CT RT INGUINAL DRAIN    Complications: None  Estimated Blood Loss:  MIN  Findings: 10FR DRAIN PLACED WITHIN THE AIR COLLECTION AIR DECOMPRESSED.  NO SIG FLUID COMPONENT. GRAVITY DRAINAGE    Sharen Counter, MD

## 2022-04-14 NOTE — H&P (Addendum)
Chief Complaint: Recurrent right inguinal abscess. Request if for right inguinal abscess drain placement  Referring Physician(s): Reine Just  Supervising Physician: Irish Lack  Patient Status: [facility] - Out-pt  History of Present Illness: Cynthia Morrison is a 66 y.o. female patient. History of incarcerated right inguinal hernia s/P mesh repair and ex lap on 3.28.23. Hospital course complicated by multiple intra abdominal abscesses. IR placed several abscess drains including a pelvic abscess drain via a left trans gluteal approach on 4.4. 23. On 4.18.23 IR placed a right inguinal and RLQ abscess drain.  Per surgical notes dated 5.20.34 Patient has all drains removed with the exception of the left trans gluteal abscess drain. Last abscessogram performed on 7.20.23 reads left trans gluteal abscess drain catheter with suspected communication to endometrial cavity and right fallopian tube as well as fistula to collection in the region o the right inguinal hernia repair. CT pelvis from 7.20.23 recurrent complex 7.5 cm right inguinal abscess with fistula.  Team is requesting a right inguinal abscess drain. Case approved by Dr. Deanne Coffer.  Patient alert and laying in bed, calm and comfortable. Endorses right inguinal swelling.  Denies any fevers, headache, chest pain, SOB, cough, abdominal pain, nausea, vomiting or bleeding.Return precautions and treatment recommendations and follow-up discussed with the patient  who is agreeable with the plan.    Past Medical History:  Diagnosis Date   Hypertension     Past Surgical History:  Procedure Laterality Date   BACK SURGERY     GANGLION CYST EXCISION Right    INGUINAL HERNIA REPAIR Right 12/17/2021   Procedure: LAPAROSCOPIC RIGHT FEMORAL HERNIA REPAIR WITH MESH;  Surgeon: Axel Filler, MD;  Location: [facility] OR;  Service: General;  Laterality: Right;   INGUINAL HERNIA REPAIR Right 12/17/2021   Procedure: EXPLORATORY LAPAROTOMY SMALL BOWEL  RESECTION WITH ANASTOMOSIS;  Surgeon: Axel Filler, MD;  Location: [facility] OR;  Service: General;  Laterality: Right;   IR CATHETER TUBE CHANGE  03/04/2022   IR RADIOLOGIST EVAL & MGMT  01/21/2022   IR RADIOLOGIST EVAL & MGMT  01/28/2022   IR RADIOLOGIST EVAL & MGMT  02/12/2022   IR RADIOLOGIST EVAL & MGMT  03/21/2022   IR RADIOLOGIST EVAL & MGMT  04/10/2022   WISDOM TOOTH EXTRACTION      Allergies: Buspirone hcl, Codeine, Sertraline hcl, Aleve [naproxen sodium], and Sulfamethoxazole-trimethoprim  Medications: Prior to Admission medications   Medication Sig Start Date End Date Taking? Authorizing Provider  amLODipine (NORVASC) 5 MG tablet Take 1 tablet (5 mg total) by mouth daily. Patient taking differently: Take 5 mg by mouth in the morning and at bedtime. 06/30/19   Jodelle Red, MD  docusate sodium (COLACE) 100 MG capsule Take 1 capsule (100 mg total) by mouth every 12 (twelve) hours. Patient not taking: Reported on 12/18/2021 12/13/21   Valentino Nose, NP  losartan (COZAAR) 50 MG tablet Take 50 mg by mouth 2 (two) times daily. 06/23/19   [provider]  ondansetron (ZOFRAN-ODT) 4 MG disintegrating tablet Take 1 tablet (4 mg total) by mouth every 8 (eight) hours as needed for nausea or vomiting. 12/13/21   Valentino Nose, NP  polyethylene glycol (MIRALAX) 17 g packet Take 17 g by mouth daily. Patient not taking: Reported on 12/18/2021 12/13/21   Valentino Nose, NP  Sodium Chloride Flush (NORMAL SALINE FLUSH) 0.9 % SOLN Flush with in each drain 2 times a day 01/15/22   Axel Filler, MD  sodium chloride flush (NS) 0.9 % SOLN  10 mLs by Intracatheter route every 12 (twelve) hours. 01/09/22   Eric Form, PA-C     Family History  Problem Relation Age of Onset   Hypertension Mother    Heart failure Father    Pancreatic cancer Father     Social History   Socioeconomic History   Marital status: Single    Spouse name: Not on file   Number of  children: Not on file   Years of education: Not on file   Highest education level: Not on file  Occupational History   Not on file  Tobacco Use   Smoking status: Former   Smokeless tobacco: Never  Substance and Sexual Activity   Alcohol use: Yes   Drug use: No   Sexual activity: Not on file  Other Topics Concern   Not on file  Social History Narrative   Not on file   Social Determinants of Health   Financial Resource Strain: Not on file  Food Insecurity: Not on file  Transportation Needs: Not on file  Physical Activity: Not on file  Stress: Not on file  Social Connections: Not on file    Review of Systems: A 12 point ROS discussed and pertinent positives are indicated in the HPI above.  All other systems are negative.  Review of Systems  Constitutional:  Negative for fatigue and fever.  HENT:  Negative for congestion.   Respiratory:  Negative for cough and shortness of breath.   Gastrointestinal:  Positive for abdominal pain (RLQ swelling lower abdominal discomfort.). Negative for diarrhea, nausea and vomiting.    Vital Signs: There were no vitals taken for this visit.    Physical Exam Vitals and nursing note reviewed.  Constitutional:      Appearance: She is well-developed.  HENT:     Head: Normocephalic and atraumatic.  Eyes:     Conjunctiva/sclera: Conjunctivae normal.  Cardiovascular:     Rate and Rhythm: Normal rate and regular rhythm.     Heart sounds: Normal heart sounds.  Pulmonary:     Effort: Pulmonary effort is normal.     Breath sounds: Normal breath sounds.  Musculoskeletal:        General: Normal range of motion.     Cervical back: Normal range of motion.  Skin:    General: Skin is warm.  Neurological:     Mental Status: She is alert and oriented to person, place, and time.     Imaging: DG Sinus/Fist Tube Chk-Non GI  Result Date: 04/10/2022 CLINICAL DATA:  Pelvic abscess post hernia repair with incarcerated bowel, status post left  transgluteal drain catheter placement. Patient also describes increasing right inguinal swelling. EXAM: ABSCESS DRAIN CATHETER INJECTION UNDER FLUOROSCOPY COMPARISON:  03/04/2022 and previous TECHNIQUE: The procedure, risks (including but not limited to bleeding, infection, organ damage), benefits, and alternatives were explained to the patient. Questions regarding the procedure were encouraged and answered. The patient understands and consents to the procedure. Survey fluoroscopic inspection reveals stable position of left transgluteal pigtail drain catheter. Injection demonstrates resolution of abscess cavity around the drain catheter. There is early filling of suspected decompressed endometrial cavity and possible right fallopian tube. There is linear contrast extension as well to the right lateral pelvis. IMPRESSION: 1. Left transgluteal abscess drain catheter with suspected communication to endometrial cavity and right fallopian tube, as well as fistula to collection in region of right inguinal hernia repair. 2. See findings on subsequent CT pelvis, reported separately. Electronically Signed   By: Algis Downs  Deanne Coffer M.D.   On: 04/10/2022 16:20   CT PELVIS W CONTRAST  Result Date: 04/10/2022 CLINICAL DATA:  Incarcerated right femoral hernia, status post small bowel resection and hernia repair 12/17/2021, postop abscess status post percutaneous drainage left transgluteal 12/24/2021. She subsequently had 4 drains placed into right inguinal, peritoneal, left peritoneal, and upper abdominal abscesses 01/07/2022. The right lower quadrant and inguinal drains were removed 01/21/2022. fistula from the left transgluteal drain demonstrated on CT injection under fluoroscopy. The left transgluteal drain within downsized to a 10 [place] device on 03/04/2022. 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 Patient presented today for drain injection. She has painful swelling in the right inguinal region. EXAM: CT PELVIS WITH  CONTRAST TECHNIQUE: Multidetector CT imaging of the pelvis was performed using the standard protocol following the bolus administration of intravenous contrast. RADIATION DOSE REDUCTION: This exam was performed according to the departmental dose-optimization program which includes automated exposure control, adjustment of the mA and/or kV according to patient size and/or use of iterative reconstruction technique. CONTRAST:  ISOVUE-300 IOPAMIDOL (ISOVUE-300) INJECTION 61% COMPARISON:  01/21/2022 and previous FINDINGS: Urinary Tract:  No abnormality visualized. Bowel: Visualized small bowel and colon are nondistended. Small bowel anastomotic staple line noted in the left pelvis. Scattered descending and sigmoid diverticula without adjacent inflammatory change. Vascular/Lymphatic: Minimal aortoiliac calcified atheromatous plaque without aneurysm. No retroperitoneal or pelvic adenopathy. Reproductive: Contrast in the decompressed endometrial cavity and left fallopian tube related to recent catheter injection. No adnexal mass. Other: There is a 7.5 x 2.4 x 2 cm complex gas and fluid collection in the region of right inguinal hernia repair, containing moderate amount of contrast from recent left transgluteal drain injection. Musculoskeletal: IMPRESSION: 1. Recurrent complex 7.5 cm right inguinal abscess in region of hernia repair. 2. Left transgluteal pelvic drain communicates with endometrial cavity, right fallopian tube, and right inguinal abscess. 3. Descending and sigmoid diverticulosis. 4.  Aortic Atherosclerosis (ICD10-170.0). Electronically Signed   By: Corlis Leak M.D.   On: 04/10/2022 16:16   IR Radiologist Eval & Mgmt  Result Date: 04/10/2022 Please refer to notes tab for details about interventional procedure. (Op Note)  DG Sinus/Fist Tube Chk-Non GI  Result Date: 03/21/2022 INDICATION: Postop abscess, residual transgluteal abscess drain EXAM: FLUOROSCOPIC INJECTION OF THE TRANS GLUTEAL ABSCESS  DRAIN MEDICATIONS: patient has completed oral antibiotics ANESTHESIA/SEDATION: None. COMPLICATIONS: None immediate. PROCEDURE: Informed written consent was obtained from the patient after a thorough discussion of the procedural risks, benefits and alternatives. All questions were addressed. Maximal Sterile Barrier Technique was utilized including caps, mask, sterile gowns, sterile gloves, sterile drape, hand hygiene and skin antiseptic. A timeout was performed prior to the initiation of the procedure. Contrast injection performed in the trans gluteal abscess drain. Fluoroscopic imaging performed. Drain catheter is stable in position. Small patent fistula remains from the collapsed drain catheter site to an adjacent small bowel loop as well as the sigmoid colon. Similar appearance to 03/04/2022. IMPRESSION: Positive exam for a small fistulas to small bowel and the sigmoid colon. Electronically Signed   By: Judie Petit.  Shick M.D.   On: 03/21/2022 14:37   IR Radiologist Eval & Mgmt  Result Date: 03/21/2022 Please refer to notes tab for details about interventional procedure. (Op Note)   Labs:  CBC: Recent Labs    12/26/21 0115 01/04/22 1808 01/05/22 0540 01/06/22 0449  WBC 15.5* 9.2 7.9 7.9  HGB 13.5 13.4 9.1* 10.2*  HCT 39.2 38.9 26.7* 31.5*  PLT 167 346 244 258    COAGS:  Recent Labs    12/17/21 1348 01/04/22 1808 01/05/22 0540  INR 1.1 1.2  --   APTT 23*  --  32    BMP: Recent Labs    12/25/21 0143 12/26/21 0115 01/04/22 1808 01/05/22 0540  NA 132* 132* 129* 133*  K 3.4* 4.1 2.7* 3.6  CL 101 100 94* 103  CO2 24 22 23 24   GLUCOSE 92 84 109* 88  BUN 10 7* 6* 5*  CALCIUM 7.5* 7.5* 7.9* 7.0*  CREATININE 0.86 0.86 0.80 0.62  GFRNONAA >60 >60 >60 >60    LIVER FUNCTION TESTS: Recent Labs    12/17/21 1540 12/18/21 0129 12/24/21 0111 01/04/22 1808  BILITOT 0.4 0.5 1.0 0.6  AST 38 29 16 20   ALT 39 31 10 10   ALKPHOS 60 54 55 59  PROT 5.9* 5.4* 4.7* 6.3*  ALBUMIN 2.8* 2.8*  1.6* 2.1*     Assessment and Plan:  66 y.o. female outpatient. History of incarcerated right inguinal hernia s/P mesh repair and ex lap on 3.28.23. Hospital course complicated by multiple intra abdominal abscesses. IR placed several abscess drains including a pelvic abscess drain via a left trans gluteal approach on 4.4. 23. On 4.18.23 IR placed a right inguinal and RLQ abscess drain.  Per surgical notes dated 5.20.34 Patient has all drains removed with the exception of the left trans gluteal abscess drain. Last abscessogram performed on 7.20.23 reads left trans gluteal abscess drain catheter with suspected communication to endometrial cavity and right fallopian tube as well as fistula to collection in the region o the right inguinal hernia repair. The area where the prior trans gluteal drain was enlarging. CT pelvis from 7.20.23 recurrent complex 7.5 cm right inguinal abscess with fistula.  Team is requesting a right inguinal abscess drain. Case approved by Dr. [id number].    No recent labs and medications are within acceptable parameters. Allergies include codeine, aleve, Bactrim. Patient has been NPO since midnight.    Risks and benefits discussed with the patient including bleeding, infection, damage to adjacent structures, bowel perforation/fistula connection, and sepsis.  All of the patient's questions were answered, patient is agreeable to proceed. Consent signed and in chart.   Thank you for this interesting consult.  I greatly enjoyed meeting Cynthia Morrison and look forward to participating in their care.  A copy of this report was sent to the requesting provider on this date.  Electronically Signed: 08-06-1985, NP 04/14/2022, 7:09 AM   I spent a total of  30 Minutes   in face to face in clinical consultation, greater than 50% of which was counseling/coordinating care for right inguinal abscess drain placement.

## 2022-04-15 ENCOUNTER — Telehealth (HOSPITAL_COMMUNITY): Payer: Self-pay | Admitting: Radiology

## 2022-04-15 NOTE — Telephone Encounter (Signed)
Patient called requesting permission for a FMLA day tomorrow 7.26.23. 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 Patient states that she does not need anything in writing and a verbal agreement would suffice. Patient given verbal permission.

## 2022-04-17 ENCOUNTER — Encounter (HOSPITAL_COMMUNITY): Payer: Self-pay | Admitting: Radiology

## 2022-04-17 ENCOUNTER — Telehealth (HOSPITAL_COMMUNITY): Payer: Self-pay | Admitting: Student

## 2022-04-17 ENCOUNTER — Other Ambulatory Visit (HOSPITAL_COMMUNITY): Payer: Self-pay | Admitting: Interventional Radiology

## 2022-04-17 ENCOUNTER — Ambulatory Visit (HOSPITAL_COMMUNITY)
Admission: RE | Admit: 2022-04-17 | Discharge: 2022-04-17 | Disposition: A | Discharge status: Home / Self Care | Payer: BC Managed Care – PPO | Source: Ambulatory Visit | Attending: Interventional Radiology | Admitting: Interventional Radiology

## 2022-04-17 DIAGNOSIS — L02214 Cutaneous abscess of groin: Secondary | ICD-10-CM | POA: Insufficient documentation

## 2022-04-17 DIAGNOSIS — L0291 Cutaneous abscess, unspecified: Secondary | ICD-10-CM

## 2022-04-17 DIAGNOSIS — T859XXA Unspecified complication of internal prosthetic device, implant and graft, initial encounter: Secondary | ICD-10-CM | POA: Insufficient documentation

## 2022-04-17 DIAGNOSIS — T82848A Pain from vascular prosthetic devices, implants and grafts, initial encounter: Secondary | ICD-10-CM | POA: Diagnosis not present

## 2022-04-17 DIAGNOSIS — Y831 Surgical operation with implant of artificial internal device as the cause of abnormal reaction of the patient, or of later complication, without mention of misadventure at the time of the procedure: Secondary | ICD-10-CM | POA: Insufficient documentation

## 2022-04-17 HISTORY — PX: IR PATIENT EVAL TECH 0-60 MINS: IMG5564

## 2022-04-17 HISTORY — PX: IR RADIOLOGIST EVAL & MGMT: IMG5224

## 2022-04-17 NOTE — Procedures (Signed)
Upon the patient's arrival, she stated she was here for an evaluation of two abscess drains. 1. Right groin. 2. Left buttock. The bandage from right groin was removed. Alwyn Ren came into the exam room. She removed the bag from the end of the drain and flushed the drain with saline. The drain flushed with ease. Asher Muir removed the suture connecting the drain to the skin and the pt got immediate relief.The bag was reconnected and freshly dressed with gauze and tape. The same procedure was done to the left buttock drain except with removal of the suture. Asher Muir typed out a notw for the patient to be out of work until Monday. This ended our time with the patient today.

## 2022-04-17 NOTE — Telephone Encounter (Signed)
Patient scheduled for drain evaluation today at 1300.

## 2022-04-18 ENCOUNTER — Other Ambulatory Visit: Payer: Self-pay | Admitting: General Surgery

## 2022-04-18 DIAGNOSIS — L02214 Cutaneous abscess of groin: Secondary | ICD-10-CM

## 2022-04-30 ENCOUNTER — Ambulatory Visit
Admission: RE | Admit: 2022-04-30 | Discharge: 2022-04-30 | Disposition: A | Discharge status: Home / Self Care | Payer: BC Managed Care – PPO | Source: Ambulatory Visit | Attending: Student | Admitting: Student

## 2022-04-30 ENCOUNTER — Ambulatory Visit
Admission: RE | Admit: 2022-04-30 | Discharge: 2022-04-30 | Disposition: A | Discharge status: Home / Self Care | Payer: BC Managed Care – PPO | Source: Ambulatory Visit | Attending: General Surgery | Admitting: General Surgery

## 2022-04-30 DIAGNOSIS — K5730 Diverticulosis of large intestine without perforation or abscess without bleeding: Secondary | ICD-10-CM | POA: Diagnosis not present

## 2022-04-30 DIAGNOSIS — K760 Fatty (change of) liver, not elsewhere classified: Secondary | ICD-10-CM | POA: Diagnosis not present

## 2022-04-30 DIAGNOSIS — L02214 Cutaneous abscess of groin: Secondary | ICD-10-CM

## 2022-04-30 DIAGNOSIS — N829 Female genital tract fistula, unspecified: Secondary | ICD-10-CM | POA: Diagnosis not present

## 2022-04-30 DIAGNOSIS — K6389 Other specified diseases of intestine: Secondary | ICD-10-CM | POA: Diagnosis not present

## 2022-04-30 HISTORY — PX: IR RADIOLOGIST EVAL & MGMT: IMG5224

## 2022-04-30 MED ORDER — IOPAMIDOL (ISOVUE-300) INJECTION 61%
100.0000 mL | Freq: Once | INTRAVENOUS | Status: AC | PRN
  Administered 2022-04-30: 100 mL via INTRAVENOUS

## 2022-04-30 NOTE — Progress Notes (Signed)
Referring Physician(s): Dr Grayling Congress  Chief Complaint: The patient is seen in follow up today s/p incarcerated right inguinal hernia s/p mesh. Original 4 drains in IR 01/06/22 Has existing right inguinal abscess drain and left transgluteal drain still intact  History of present illness:  Complicated history. Incarcerated right inguinal hernia status post emergent repair complicated by multiple intra-abdominal and inguinal abscesses. Most recent drain injection on 7/20 revealing: Left transgluteal abscess drain catheter with suspected communication to endometrial cavity and right fallopian tube, as well as fistula to collection in region of right inguinal hernia repair.  Additional right inguinal 10 French drain placed within the loculated air collection on 04/14/22 in IR  Pt now here for CT and evaluation of each drain.  Pt denies pain; fever; chills OP is still brown color; odorous and ~15-20 cc daily for both drains No flushes No antibiotics  Scheduled to see Dr Derrell Lolling end of this month    Past Medical History:  Diagnosis Date   Hypertension     Past Surgical History:  Procedure Laterality Date   BACK SURGERY     GANGLION CYST EXCISION Right    INGUINAL HERNIA REPAIR Right 12/17/2021   Procedure: LAPAROSCOPIC RIGHT FEMORAL HERNIA REPAIR WITH MESH;  Surgeon: Axel Filler, MD;  Location: [facility] OR;  Service: General;  Laterality: Right;   INGUINAL HERNIA REPAIR Right 12/17/2021   Procedure: EXPLORATORY LAPAROTOMY SMALL BOWEL RESECTION WITH ANASTOMOSIS;  Surgeon: Axel Filler, MD;  Location: [facility] OR;  Service: General;  Laterality: Right;   IR CATHETER TUBE CHANGE  03/04/2022   IR PATIENT EVAL TECH 0-60 MINS  04/17/2022   IR RADIOLOGIST EVAL & MGMT  01/21/2022   IR RADIOLOGIST EVAL & MGMT  01/28/2022   IR RADIOLOGIST EVAL & MGMT  02/12/2022   IR RADIOLOGIST EVAL & MGMT  03/21/2022   IR RADIOLOGIST EVAL & MGMT  04/10/2022   IR RADIOLOGIST EVAL & MGMT  04/17/2022   IR  RADIOLOGIST EVAL & MGMT  04/30/2022   WISDOM TOOTH EXTRACTION      Allergies: Buspirone hcl, Codeine, Sertraline hcl, Aleve [naproxen sodium], and Sulfamethoxazole-trimethoprim  Medications: Prior to Admission medications   Medication Sig Start Date End Date Taking? Authorizing Provider  amLODipine (NORVASC) 5 MG tablet Take 1 tablet (5 mg total) by mouth daily. Patient taking differently: Take 5 mg by mouth in the morning and at bedtime. 06/30/19   Jodelle Red, MD  docusate sodium (COLACE) 100 MG capsule Take 1 capsule (100 mg total) by mouth every 12 (twelve) hours. 12/13/21   Valentino Nose, NP  losartan (COZAAR) 50 MG tablet Take 50 mg by mouth 2 (two) times daily. 06/23/19   [provider]  ondansetron (ZOFRAN-ODT) 4 MG disintegrating tablet Take 1 tablet (4 mg total) by mouth every 8 (eight) hours as needed for nausea or vomiting. 12/13/21   Valentino Nose, NP  polyethylene glycol (MIRALAX) 17 g packet Take 17 g by mouth daily. 12/13/21   Valentino Nose, NP  Sodium Chloride Flush (NORMAL SALINE FLUSH) 0.9 % SOLN Flush with in each drain 2 times a day 01/15/22   Axel Filler, MD  sodium chloride flush (NS) 0.9 % SOLN 10 mLs by Intracatheter route every 12 (twelve) hours. 01/09/22   Eric Form, PA-C     Family History  Problem Relation Age of Onset   Hypertension Mother    Heart failure Father    Pancreatic cancer Father     Social History  Socioeconomic History   Marital status: Single    Spouse name: Not on file   Number of children: Not on file   Years of education: Not on file   Highest education level: Not on file  Occupational History   Not on file  Tobacco Use   Smoking status: Former   Smokeless tobacco: Never  Substance and Sexual Activity   Alcohol use: Yes   Drug use: No   Sexual activity: Not on file  Other Topics Concern   Not on file  Social History Narrative   Not on file   Social Determinants of Health    Financial Resource Strain: Not on file  Food Insecurity: Not on file  Transportation Needs: Not on file  Physical Activity: Not on file  Stress: Not on file  Social Connections: Not on file     Vital Signs: BP (!) 193/93   Pulse 78   Temp 98.1 F (36.7 C)   SpO2 100%   Physical Exam Vitals reviewed.  Skin:    General: Skin is warm.     Comments: Sites are clean and dry NT no bleeding OP brown fluid from each drain 15 cc in each bag (about 12 hour collection)  Drain injection of left TG drain shows + fistula remains Although seemingly healing per Dr Fredia Sorrow     Imaging: IR Radiologist Eval & Mgmt  Result Date: 04/30/2022 Please refer to notes tab for details about interventional procedure. (Op Note)   Labs:  CBC: Recent Labs    01/04/22 1808 01/05/22 0540 01/06/22 0449 04/14/22 0806  WBC 9.2 7.9 7.9 10.5  HGB 13.4 9.1* 10.2* 15.4*  HCT 38.9 26.7* 31.5* 46.6*  PLT 346 244 258 350    COAGS: Recent Labs    12/17/21 1348 01/04/22 1808 01/05/22 0540  INR 1.1 1.2  --   APTT 23*  --  32    BMP: Recent Labs    12/25/21 0143 12/26/21 0115 01/04/22 1808 01/05/22 0540  NA 132* 132* 129* 133*  K 3.4* 4.1 2.7* 3.6  CL 101 100 94* 103  CO2 24 22 23 24   GLUCOSE 92 84 109* 88  BUN 10 7* 6* 5*  CALCIUM 7.5* 7.5* 7.9* 7.0*  CREATININE 0.86 0.86 0.80 0.62  GFRNONAA >60 >60 >60 >60    LIVER FUNCTION TESTS: Recent Labs    12/17/21 1540 12/18/21 0129 12/24/21 0111 01/04/22 1808  BILITOT 0.4 0.5 1.0 0.6  AST 38 29 16 20   ALT 39 31 10 10   ALKPHOS 60 54 55 59  PROT 5.9* 5.4* 4.7* 6.3*  ALBUMIN 2.8* 2.8* 1.6* 2.1*    Assessment:  Incarcerated inguinal hernia with intra abdominal abscesses. Drains placed in IR Left transgluteal and Rt inguinal drains remain Known fistula per TG drain CT today revealing resolution of abscesses per Dr 01/06/22 Injection of L TG drain does show persistent fistula but evidence of healing at this point. She has  appt to see Dr at end of this month Please schedule pt back to see Dr on Aug 30th for drain injections of both drains only-- No CT She has good understanding of this plan and is agreeable  Signed: Derrell Lolling, PA-C 04/30/2022, 2:19 PM   Please refer to Dr. Sep 01 attestation of this note for management and plan.

## 2022-05-01 ENCOUNTER — Other Ambulatory Visit: Payer: Self-pay | Admitting: General Surgery

## 2022-05-01 DIAGNOSIS — L02214 Cutaneous abscess of groin: Secondary | ICD-10-CM

## 2022-05-01 DIAGNOSIS — L0231 Cutaneous abscess of buttock: Secondary | ICD-10-CM

## 2022-05-21 ENCOUNTER — Other Ambulatory Visit: Payer: BC Managed Care – PPO

## 2022-05-22 ENCOUNTER — Emergency Department (HOSPITAL_COMMUNITY): Payer: BC Managed Care – PPO

## 2022-05-22 ENCOUNTER — Other Ambulatory Visit: Payer: Self-pay

## 2022-05-22 ENCOUNTER — Emergency Department (HOSPITAL_COMMUNITY)
Admission: EM | Admit: 2022-05-22 | Discharge: 2022-05-22 | Disposition: A | Discharge status: Home / Self Care | Payer: BC Managed Care – PPO | Attending: Emergency Medicine | Admitting: Emergency Medicine

## 2022-05-22 DIAGNOSIS — N390 Urinary tract infection, site not specified: Secondary | ICD-10-CM | POA: Insufficient documentation

## 2022-05-22 DIAGNOSIS — R911 Solitary pulmonary nodule: Secondary | ICD-10-CM | POA: Diagnosis not present

## 2022-05-22 DIAGNOSIS — E871 Hypo-osmolality and hyponatremia: Secondary | ICD-10-CM | POA: Insufficient documentation

## 2022-05-22 DIAGNOSIS — U071 COVID-19: Secondary | ICD-10-CM | POA: Diagnosis not present

## 2022-05-22 DIAGNOSIS — I700 Atherosclerosis of aorta: Secondary | ICD-10-CM | POA: Diagnosis not present

## 2022-05-22 DIAGNOSIS — R42 Dizziness and giddiness: Secondary | ICD-10-CM | POA: Diagnosis not present

## 2022-05-22 DIAGNOSIS — R109 Unspecified abdominal pain: Secondary | ICD-10-CM | POA: Diagnosis not present

## 2022-05-22 LAB — URINALYSIS, ROUTINE W REFLEX MICROSCOPIC
Bilirubin Urine: NEGATIVE
Glucose, UA: NEGATIVE mg/dL
Hgb urine dipstick: NEGATIVE
Ketones, ur: 80 mg/dL — AB
Nitrite: POSITIVE — AB
Protein, ur: 100 mg/dL — AB
Specific Gravity, Urine: 1.02 (ref 1.005–1.030)
WBC, UA: >50 WBC/hpf — ABNORMAL HIGH (ref 0–5)
pH: 5 (ref 5.0–8.0)

## 2022-05-22 LAB — COMPREHENSIVE METABOLIC PANEL
ALT: 24 U/L (ref 0–44)
AST: 42 U/L — ABNORMAL HIGH (ref 15–41)
Albumin: 3.6 g/dL (ref 3.5–5.0)
Alkaline Phosphatase: 83 U/L (ref 38–126)
Anion gap: 15 (ref 5–15)
BUN: 21 mg/dL (ref 8–23)
CO2: 19 mmol/L — ABNORMAL LOW (ref 22–32)
Calcium: 9 mg/dL (ref 8.9–10.3)
Chloride: 100 mmol/L (ref 98–111)
Creatinine, Ser: 1.07 mg/dL — ABNORMAL HIGH (ref 0.44–1.00)
GFR, Estimated: 58 mL/min — ABNORMAL LOW (ref >60)
Glucose, Bld: 75 mg/dL (ref 70–99)
Potassium: 3.8 mmol/L (ref 3.5–5.1)
Sodium: 134 mmol/L — ABNORMAL LOW (ref 135–145)
Total Bilirubin: 0.7 mg/dL (ref 0.3–1.2)
Total Protein: 6.9 g/dL (ref 6.5–8.1)

## 2022-05-22 LAB — CBC
HCT: 50.2 % — ABNORMAL HIGH (ref 36.0–46.0)
Hemoglobin: 16.2 g/dL — ABNORMAL HIGH (ref 12.0–15.0)
MCH: 30.5 pg (ref 26.0–34.0)
MCHC: 32.3 g/dL (ref 30.0–36.0)
MCV: 94.4 fL (ref 80.0–100.0)
Platelets: 186 10*3/uL (ref 150–400)
RBC: 5.32 MIL/uL — ABNORMAL HIGH (ref 3.87–5.11)
RDW: 13.8 % (ref 11.5–15.5)
WBC: 8.9 10*3/uL (ref 4.0–10.5)
nRBC: 0 % (ref 0.0–0.2)

## 2022-05-22 LAB — CBG MONITORING, ED
Glucose-Capillary: 76 mg/dL (ref 70–99)
Glucose-Capillary: 72 mg/dL (ref 70–99)

## 2022-05-22 LAB — BASIC METABOLIC PANEL
Anion gap: 16 — ABNORMAL HIGH (ref 5–15)
BUN: 21 mg/dL (ref 8–23)
CO2: 19 mmol/L — ABNORMAL LOW (ref 22–32)
Calcium: 9.4 mg/dL (ref 8.9–10.3)
Chloride: 101 mmol/L (ref 98–111)
Creatinine, Ser: 1.16 mg/dL — ABNORMAL HIGH (ref 0.44–1.00)
GFR, Estimated: 53 mL/min — ABNORMAL LOW (ref >60)
Glucose, Bld: 80 mg/dL (ref 70–99)
Potassium: 4.5 mmol/L (ref 3.5–5.1)
Sodium: 136 mmol/L (ref 135–145)

## 2022-05-22 LAB — PROTIME-INR
INR: 0.9 (ref 0.8–1.2)
Prothrombin Time: 12.3 seconds (ref 11.4–15.2)

## 2022-05-22 LAB — LACTIC ACID, PLASMA: Lactic Acid, Venous: 1 mmol/L (ref 0.5–1.9)

## 2022-05-22 LAB — APTT: aPTT: 28 seconds (ref 24–36)

## 2022-05-22 LAB — SARS CORONAVIRUS 2 BY RT PCR: SARS Coronavirus 2 by RT PCR: POSITIVE — AB

## 2022-05-22 MED ORDER — SODIUM CHLORIDE 0.9 % IV SOLN
2.0000 g | Freq: Once | INTRAVENOUS | Status: AC
  Administered 2022-05-22: 2 g via INTRAVENOUS
  Filled 2022-05-22: qty 20

## 2022-05-22 MED ORDER — IOHEXOL 300 MG/ML  SOLN
70.0000 mL | Freq: Once | INTRAMUSCULAR | Status: AC | PRN
Start: 2022-05-22 — End: 2022-05-22
  Administered 2022-05-22: 70 mL via INTRAVENOUS

## 2022-05-22 MED ORDER — LACTATED RINGERS IV BOLUS (SEPSIS)
1000.0000 mL | Freq: Once | INTRAVENOUS | Status: AC
  Administered 2022-05-22: 1000 mL via INTRAVENOUS

## 2022-05-22 MED ORDER — CEPHALEXIN 500 MG PO CAPS
500.0000 mg | ORAL_CAPSULE | Freq: Four times a day (QID) | ORAL | 0 refills | Status: DC
Start: 2022-05-22 — End: 2022-07-01

## 2022-05-22 NOTE — ED Triage Notes (Addendum)
Pt. Stated, (crying) Im having episodes of dizziness for a month. My daughter has COVID right now. Im also having some back pain which might be my kidneys. I have a tube for draining, cause I had an infection.  Pt fell this morning because of the dizziness.

## 2022-05-22 NOTE — ED Notes (Signed)
Pt offered tylenol and declined

## 2022-05-22 NOTE — ED Provider Notes (Signed)
[facility] EMERGENCY DEPARTMENT Provider Note   CSN: [id number] Arrival date & time: 05/22/22  1046     History  Chief Complaint  Patient presents with   Dizziness   Back Pain    Cynthia Morrison is a 66 y.o. female.  HPI 66 year old female with history of strangulated hernia who presented in March with this, at that time, she had resection and then had pelvic abscesses that required IR drainage.  Patient was discharged home on 12/26/2021 with drains in place.  Patient represented on 01/04/2022 with additional abscesses.  She had an additional 3 drains placed by interventional radiology and was discharged home with Levaquin and Flagyl on 01/09/2022.  She reports that she has been having increased pain in her groin.  She reports low back pain with associated nausea.  Today she was very lightheaded and had a syncopal episode.  She describes this as feeling lightheaded and then falling to the ground.  She denies any injury from the fall.  She does not think that she has had fever or chills.  Her urine has been very dark.     Home Medications Prior to Admission medications   Medication Sig Start Date End Date Taking? Authorizing Provider  cephALEXin (KEFLEX) 500 MG capsule Take 1 capsule (500 mg total) by mouth 4 (four) times daily. 05/22/22  Yes Margarita Grizzle, MD  amLODipine (NORVASC) 5 MG tablet Take 1 tablet (5 mg total) by mouth daily. Patient taking differently: Take 5 mg by mouth in the morning and at bedtime. 06/30/19   Jodelle Red, MD  docusate sodium (COLACE) 100 MG capsule Take 1 capsule (100 mg total) by mouth every 12 (twelve) hours. 12/13/21   Valentino Nose, NP  losartan (COZAAR) 50 MG tablet Take 50 mg by mouth 2 (two) times daily. 06/23/19   [provider]  ondansetron (ZOFRAN-ODT) 4 MG disintegrating tablet Take 1 tablet (4 mg total) by mouth every 8 (eight) hours as needed for nausea or vomiting. 12/13/21   Valentino Nose, NP   polyethylene glycol (MIRALAX) 17 g packet Take 17 g by mouth daily. 12/13/21   Valentino Nose, NP  Sodium Chloride Flush (NORMAL SALINE FLUSH) 0.9 % SOLN Flush with in each drain 2 times a day 01/15/22   Axel Filler, MD  sodium chloride flush (NS) 0.9 % SOLN 10 mLs by Intracatheter route every 12 (twelve) hours. 01/09/22   Eric Form, PA-C      Allergies    Buspirone hcl, Codeine, Sertraline hcl, Aleve [naproxen sodium], and Sulfamethoxazole-trimethoprim    Review of Systems   Review of Systems  Physical Exam Updated Vital Signs BP (!) 142/83   Pulse 67   Temp 98.2 F (36.8 C) (Oral)   Resp (!) 21   Ht 1.524 m (5')   Wt 56.2 kg   SpO2 96%   BMI 24.22 kg/m  Physical Exam Vitals and nursing note reviewed.  Constitutional:      General: She is not in acute distress.    Appearance: Normal appearance. She is ill-appearing.  HENT:     Head: Normocephalic.     Right Ear: External ear normal.     Left Ear: External ear normal.     Nose: Nose normal.     Mouth/Throat:     Pharynx: Oropharynx is clear.  Eyes:     Extraocular Movements: Extraocular movements intact.     Pupils: Pupils are equal, round, and reactive to light.  Cardiovascular:     Rate and Rhythm: Regular rhythm. Tachycardia present.     Pulses: Normal pulses.  Pulmonary:     Effort: Pulmonary effort is normal.     Breath sounds: Normal breath sounds.  Abdominal:     Palpations: Abdomen is soft.     Comments: Drains in place right lower quadrant with mild diffuse tenderness to palpation Mild erythema at the drain site  Musculoskeletal:        General: Normal range of motion.     Cervical back: Normal range of motion.     Comments: Exam with low back drain in place  Skin:    General: Skin is warm and dry.     Capillary Refill: Capillary refill takes less than 2 seconds.  Neurological:     General: No focal deficit present.     Mental Status: She is alert.  Psychiatric:        Mood  and Affect: Mood normal.     ED Results / Procedures / Treatments   Labs (all labs ordered are listed, but only abnormal results are displayed) Labs Reviewed  SARS CORONAVIRUS 2 BY RT PCR - Abnormal; Notable for the following components:      Result Value   SARS Coronavirus 2 by RT PCR POSITIVE (*)    All other components within normal limits  BASIC METABOLIC PANEL - Abnormal; Notable for the following components:   CO2 19 (*)    Creatinine, Ser 1.16 (*)    GFR, Estimated 53 (*)    Anion gap 16 (*)    All other components within normal limits  CBC - Abnormal; Notable for the following components:   RBC 5.32 (*)    Hemoglobin 16.2 (*)    HCT 50.2 (*)    All other components within normal limits  URINALYSIS, ROUTINE W REFLEX MICROSCOPIC - Abnormal; Notable for the following components:   Color, Urine AMBER (*)    APPearance CLOUDY (*)    Ketones, ur 80 (*)    Protein, ur 100 (*)    Nitrite POSITIVE (*)    Leukocytes,Ua LARGE (*)    WBC, UA >50 (*)    Bacteria, UA FEW (*)    All other components within normal limits  COMPREHENSIVE METABOLIC PANEL - Abnormal; Notable for the following components:   Sodium 134 (*)    CO2 19 (*)    Creatinine, Ser 1.07 (*)    AST 42 (*)    GFR, Estimated 58 (*)    All other components within normal limits  CULTURE, BLOOD (ROUTINE X 2)  CULTURE, BLOOD (ROUTINE X 2)  URINE CULTURE  LACTIC ACID, PLASMA  PROTIME-INR  APTT  LACTIC ACID, PLASMA  CBG MONITORING, ED  CBG MONITORING, ED    EKG None  Radiology CT ABDOMEN PELVIS W CONTRAST  Result Date: 05/22/2022 CLINICAL DATA:  Back pain. EXAM: CT ABDOMEN AND PELVIS WITH CONTRAST TECHNIQUE: Multidetector CT imaging of the abdomen and pelvis was performed using the standard protocol following bolus administration of intravenous contrast. RADIATION DOSE REDUCTION: This exam was performed according to the departmental dose-optimization program which includes automated exposure control,  adjustment of the mA and/or kV according to patient size and/or use of iterative reconstruction technique. CONTRAST:  43mL OMNIPAQUE IOHEXOL 300 MG/ML  SOLN COMPARISON:  April 30, 2022. FINDINGS: Lower chest: No acute abnormality. Hepatobiliary: No focal liver abnormality is seen. No gallstones, gallbladder wall thickening, or biliary dilatation. Pancreas: Unremarkable. No pancreatic ductal dilatation or  surrounding inflammatory changes. Spleen: Normal in size without focal abnormality. Adrenals/Urinary Tract: Adrenal glands are unremarkable. Kidneys are normal, without renal calculi, focal lesion, or hydronephrosis. Bladder is unremarkable. Stomach/Bowel: The stomach is unremarkable. There is no evidence of bowel obstruction. Sigmoid diverticulosis is again noted with continued wall thickening of the proximal sigmoid colon which may represent sequela of prior diverticulitis. No definite acute inflammation is seen at this time. There is continued presence of left transgluteal percutaneous drainage catheter without fluid collection seen. Stable appearance of drainage catheter in the right inguinal region is also noted without fluid collection present. Vascular/Lymphatic: Aortic atherosclerosis. No enlarged abdominal or pelvic lymph nodes. Reproductive: Uterus and bilateral adnexa are unremarkable. Other: No abdominal wall hernia or abnormality. No abdominopelvic ascites. Musculoskeletal: Stable old T12-L1 and L3 compression fractures are noted. No acute osseous abnormality is noted. IMPRESSION: Continued presence of left transgluteal and right inguinal drainage catheters without definite fluid collection seen in either area. Sigmoid diverticulosis is again noted with continued wall thickening of proximal sigmoid colon which may represent sequela of prior diverticulitis. Aortic Atherosclerosis (ICD10-I70.0). Electronically Signed   By: Marijo Conception M.D.   On: 05/22/2022 14:04   DG Chest Port 1 View  Result  Date: 05/22/2022 CLINICAL DATA:  Possible sepsis. EXAM: PORTABLE CHEST 1 VIEW COMPARISON:  01/04/2022 FINDINGS: 1207 hours. The lungs are clear without focal pneumonia, edema, pneumothorax or pleural effusion. Small pulmonary nodule left mid lung is superimposed on the posterior left sixth rib and more conspicuous than it was previously. The cardiopericardial silhouette is within normal limits for size. The visualized bony structures of the thorax are unremarkable. Telemetry leads overlie the chest. IMPRESSION: 1. Small pulmonary nodule left mid lung. Chest CT without contrast recommended to further evaluate. 2. Otherwise no acute cardiopulmonary findings. Electronically Signed   By: Misty Stanley M.D.   On: 05/22/2022 12:20    Procedures Procedures    Medications Ordered in ED Medications  cefTRIAXone (ROCEPHIN) 2 g in sodium chloride 0.9 % 100 mL IVPB (2 g Intravenous New Bag/Given 05/22/22 1537)  lactated ringers bolus 1,000 mL (0 mLs Intravenous Stopped 05/22/22 1538)  iohexol (OMNIPAQUE) 300 MG/ML solution 70 mL (70 mLs Intravenous Contrast Given 05/22/22 1353)    ED Course/ Medical Decision Making/ A&P Clinical Course as of 05/22/22 1557  Thu May 22, 2022  1552 Comprehensive metabolic panel(!) [DR]  0000000 Pleat metabolic panel reviewed interpreted significant for sodium slightly low at [id number] complete metabolic panel reviewed interpreted significant for sodium slightly low at 134 with review of the last several months it has been trending between 129 and 136 [DR]  1553 Urinalysis reviewed and significant for greater than 50 white blood cells large leukocytes and nitrite positive with few bacteria [DR]  1553 CBC reviewed interpreted within normal limits [DR]  1553 CT of abdomen pelvis reviewed and per radiologist continued presence of catheters without definite fluid collection Sigmoid diverticulosis again noted with wall thickening of proximal sigmoid colon which may represent sequela of previous  diverticulitis.  This was discussed with general surgery feels this has been stable from prior (reviewed with Dr. Bobbye Morton) [DR]  1554 Chest x-Daron Stutz reviewed and interpreted and significant for small pulmonary nodule in left midlung field otherwise normal [DR]    Clinical Course User Index [DR] Pattricia Boss, MD                           Medical Decision Making 66 year old female history  of recent pelvic abscesses with drains in place presents today complaining of some dizziness and low back pain.  Her daughter had recently says she was exposed.  She reports no symptoms of COVID, specifically denying nasal congestion cough sore throat nausea or vomiting.  She has some low back pain but denies any urinary symptoms bleeding frequency or pain evaluated here in the ED with labs, urinalysis, CT scan, chest x-Rozelia Catapano 1 COVID-patient states she has not had vaccine does not want treatment and is asymptomatic.  She does request note for work 2 probable UTI 3 lower abdominal pain and back pain patient does have drains in place and does not appear to be fluid collection this is probably secondary to #2 4 pulmonary nodule patient advised regarding need for follow-up  Amount and/or Complexity of Data Reviewed Labs: ordered. Radiology: ordered. ECG/medicine tests: ordered.  Risk Prescription drug management.           Final Clinical Impression(s) / ED Diagnoses Final diagnoses:  Urinary tract infection without hematuria, site unspecified  Solitary pulmonary nodule  COVID    Rx / DC Orders ED Discharge Orders          Ordered    cephALEXin (KEFLEX) 500 MG capsule  4 times daily        05/22/22 1555              Margarita Grizzle, MD 05/22/22 1557

## 2022-05-22 NOTE — Discharge Instructions (Addendum)
Please remain isolated from others for at least 6 days longer if you have any symptoms Please take all of your antibiotics for your urinary tract infection Please follow-up with your primary care provider regarding pulmonary nodule seen on chest x-Cynthia Morrison

## 2022-05-26 LAB — URINE CULTURE: Culture: >=100000 — AB

## 2022-05-27 ENCOUNTER — Telehealth: Payer: Self-pay | Admitting: *Deleted

## 2022-05-27 LAB — CULTURE, BLOOD (ROUTINE X 2)
Culture: NO GROWTH
Culture: NO GROWTH
Special Requests: ADEQUATE
Special Requests: ADEQUATE

## 2022-05-27 NOTE — Telephone Encounter (Signed)
Post ED Visit - Positive Culture Follow-up  Culture report reviewed by antimicrobial stewardship pharmacist: [facility] []  , Pharm.D. []  Enzo Bi, Pharm.D., BCPS AQ-ID []  , Pharm.D., BCPS []  Celedonio Miyamoto, Pharm.D., BCPS []  [place]an[place], Garvin Fila.D., BCPS, AAHIVP []  , Pharm.D., BCPS, AAHIVP []  Georgina Pillion, PharmD, BCPS []  , PharmD, BCPS []  [place], PharmD, BCPS []  [place], PharmD []  , PharmD, BCPS []  Estella Husk, PharmD  Pharmacy Team []  Lysle Pearl, PharmD []  , PharmD []  Phillips Climes, PharmD []  , Rph []  Agapito Games) , PharmD []  Verlan Friends, PharmD []  , PharmD []  Mervyn Gay, PharmD []  , PharmD []  Vinnie Level, PharmD []  Wonda [facility], PharmD []  , PharmD []  Len Childs, PharmD   Positive culture Treated with Cephalexin, organism sensitive to the same and no further patient follow-up is required at this time. Reviewed by Pharmacy  Greer Pickerel 05/27/2022, 10:34 AM

## 2022-05-28 ENCOUNTER — Ambulatory Visit
Admission: RE | Admit: 2022-05-28 | Discharge: 2022-05-28 | Disposition: A | Discharge status: Home / Self Care | Payer: BC Managed Care – PPO | Source: Ambulatory Visit | Attending: General Surgery | Admitting: General Surgery

## 2022-05-28 DIAGNOSIS — L0231 Cutaneous abscess of buttock: Secondary | ICD-10-CM

## 2022-05-28 DIAGNOSIS — L02214 Cutaneous abscess of groin: Secondary | ICD-10-CM

## 2022-05-28 DIAGNOSIS — K632 Fistula of intestine: Secondary | ICD-10-CM | POA: Diagnosis not present

## 2022-05-28 DIAGNOSIS — Z4682 Encounter for fitting and adjustment of non-vascular catheter: Secondary | ICD-10-CM | POA: Diagnosis not present

## 2022-05-28 HISTORY — PX: IR RADIOLOGIST EVAL & MGMT: IMG5224

## 2022-05-28 NOTE — Progress Notes (Signed)
Referring Physician(s): Ramirez,Armando  Chief Complaint: The patient is seen in follow up today s/p The patient is seen in follow up today s/p incarcerated right inguinal hernia s/p mesh. Original 4 drains in IR 01/06/22 Additional drain placed 04/14/22 into Rt inguinal abscess Has existing right inguinal abscess drain and left transgluteal drain still intact  History of present illness:  Complicated history. Incarcerated right inguinal hernia status post emergent repair complicated by multiple intra-abdominal and inguinal abscesses. 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  Output from the right inguinal drain still remains too high to remove it.  CT shows no residual abscess collections.  Pt says both drains remain  intact OP from TG is scant: brown and foul smelling OP from Rt inguinal drain comes and goes--- some days 20 -30 cc; some days none: OP brown fluid and foul smelling Denies pain; fever; chills To see Dr Derrell Lolling 06/09/22  Here today for evaluation of drains and drain injection of TG drain     Past Medical History:  Diagnosis Date   Hypertension     Past Surgical History:  Procedure Laterality Date   BACK SURGERY     GANGLION CYST EXCISION Right    INGUINAL HERNIA REPAIR Right 12/17/2021   Procedure: LAPAROSCOPIC RIGHT FEMORAL HERNIA REPAIR WITH MESH;  Surgeon: Axel Filler, MD;  Location: [facility] OR;  Service: General;  Laterality: Right;   INGUINAL HERNIA REPAIR Right 12/17/2021   Procedure: EXPLORATORY LAPAROTOMY SMALL BOWEL RESECTION WITH ANASTOMOSIS;  Surgeon: Axel Filler, MD;  Location: [facility] OR;  Service: General;  Laterality: Right;   IR CATHETER TUBE CHANGE  03/04/2022   IR PATIENT EVAL TECH 0-60 MINS  04/17/2022   IR RADIOLOGIST EVAL & MGMT   01/21/2022   IR RADIOLOGIST EVAL & MGMT  01/28/2022   IR RADIOLOGIST EVAL & MGMT  02/12/2022   IR RADIOLOGIST EVAL & MGMT  03/21/2022   IR RADIOLOGIST EVAL & MGMT  04/10/2022   IR RADIOLOGIST EVAL & MGMT  04/17/2022   IR RADIOLOGIST EVAL & MGMT  04/30/2022   WISDOM TOOTH EXTRACTION      Allergies: Buspirone hcl, Codeine, Sertraline hcl, Aleve [naproxen sodium], and Sulfamethoxazole-trimethoprim  Medications: Prior to Admission medications   Medication Sig Start Date End Date Taking? Authorizing Provider  amLODipine (NORVASC) 5 MG tablet Take 1 tablet (5 mg total) by mouth daily. Patient taking differently: Take 5 mg by mouth in the morning and at bedtime. 06/30/19   Jodelle Red, MD  cephALEXin (KEFLEX) 500 MG capsule Take 1 capsule (500 mg total) by mouth 4 (four) times daily. 05/22/22   Margarita Grizzle, MD  docusate sodium (COLACE) 100 MG capsule Take 1 capsule (100 mg total) by mouth every 12 (twelve) hours. 12/13/21   Valentino Nose, NP  losartan (COZAAR) 50 MG tablet Take 50 mg by mouth 2 (two) times daily. 06/23/19   [provider]  ondansetron (ZOFRAN-ODT) 4 MG disintegrating tablet Take 1 tablet (4 mg total) by mouth every 8 (eight) hours as needed for nausea or vomiting. 12/13/21   Valentino Nose, NP  polyethylene glycol (MIRALAX) 17 g packet Take 17 g by mouth daily. 12/13/21   Valentino Nose, NP  Sodium Chloride Flush (NORMAL SALINE FLUSH) 0.9 % SOLN Flush with in each drain 2 times a day 01/15/22  Axel Filler, MD  sodium chloride flush (NS) 0.9 % SOLN 10 mLs by Intracatheter route every 12 (twelve) hours. 01/09/22   Eric Form, PA-C     Family History  Problem Relation Age of Onset   Hypertension Mother    Heart failure Father    Pancreatic cancer Father     Social History   Socioeconomic History   Marital status: Single    Spouse name: Not on file   Number of children: Not on file   Years of education: Not on file   Highest  education level: Not on file  Occupational History   Not on file  Tobacco Use   Smoking status: Former   Smokeless tobacco: Never  Substance and Sexual Activity   Alcohol use: Yes   Drug use: No   Sexual activity: Not on file  Other Topics Concern   Not on file  Social History Narrative   Not on file   Social Determinants of Health   Financial Resource Strain: Not on file  Food Insecurity: Not on file  Transportation Needs: Not on file  Physical Activity: Not on file  Stress: Not on file  Social Connections: Not on file     Vital Signs: There were no vitals taken for this visit.  Sites are clean and dry NT no bleeding No infection OP from both brown thin fluid; both foul smelling  Dr Milford Cage has reviewed imaging 05/22/22--- no real changes in collections per CT Injection of TG drain today does reveal persistent connection to endometrium    Imaging: No results found.  Labs:  CBC: Recent Labs    01/05/22 0540 01/06/22 0449 04/14/22 0806 05/22/22 1111  WBC 7.9 7.9 10.5 8.9  HGB 9.1* 10.2* 15.4* 16.2*  HCT 26.7* 31.5* 46.6* 50.2*  PLT 244 258 350 186    COAGS: Recent Labs    12/17/21 1348 01/04/22 1808 01/05/22 0540 05/22/22 1331  INR 1.1 1.2  --  0.9  APTT 23*  --  32 28    BMP: Recent Labs    01/04/22 1808 01/05/22 0540 05/22/22 1111 05/22/22 1331  NA 129* 133* 136 134*  K 2.7* 3.6 4.5 3.8  CL 94* 103 101 100  CO2 23 24 19* 19*  GLUCOSE 109* 88 80 75  BUN 6* 5* 21 21  CALCIUM 7.9* 7.0* 9.4 9.0  CREATININE 0.80 0.62 1.16* 1.07*  GFRNONAA >60 >60 53* 58*    LIVER FUNCTION TESTS: Recent Labs    12/18/21 0129 12/24/21 0111 01/04/22 1808 05/22/22 1331  BILITOT 0.5 1.0 0.6 0.7  AST 29 16 20  42*  ALT 31 10 10 24   ALKPHOS 54 55 59 83  PROT 5.4* 4.7* 6.3* 6.9  ALBUMIN 2.8* 1.6* 2.1* 3.6    Assessment:  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 Robet Leu attestation of this note for management and plan.

## 2022-06-09 ENCOUNTER — Ambulatory Visit: Payer: Self-pay | Admitting: General Surgery

## 2022-06-09 DIAGNOSIS — Z9889 Other specified postprocedural states: Secondary | ICD-10-CM | POA: Diagnosis not present

## 2022-06-09 DIAGNOSIS — T8183XD Persistent postprocedural fistula, subsequent encounter: Secondary | ICD-10-CM

## 2022-06-09 DIAGNOSIS — Z8719 Personal history of other diseases of the digestive system: Secondary | ICD-10-CM | POA: Diagnosis not present

## 2022-06-09 DIAGNOSIS — L988 Other specified disorders of the skin and subcutaneous tissue: Secondary | ICD-10-CM | POA: Diagnosis not present

## 2022-06-09 NOTE — H&P (View-Only) (Signed)
Chief Complaint: Follow-up (hernia)       History of Present Illness: Cynthia Morrison is a 66 y.o. female who is seen today for follow-up from her previous laparoscopic hernia repair and small bowel resection. Patient had multiple previous drain studies.  These appear to most recently show that she had a communication with the posterior gluteal drain in the right lower quadrant drain.  There appears to be some communication with the bowel.  Patient states that she has recently been draining some foul-smelling green liquid.  She states that she recently was seen and treated at the hospital for COVID.  She states that her sister recently died this past week.   .     Review of Systems: A complete review of systems was obtained from the patient.  I have reviewed this information and discussed as appropriate with the patient.  See HPI as well for other ROS.   ROS      Medical History: Past Medical History Past Medical History: Diagnosis Date  Anxiety    Sleep apnea        There is no problem list on file for this patient.     Past Surgical History Past Surgical History: Procedure Laterality Date  Laparoscopic Right Femoral Hernia Repair with Mesh, Exploratory Laparotomy Small Bowel Resection with Anastomosis   12/17/2021   Dr. Derrell Lolling      Allergies Allergies Allergen Reactions  Buspirone Hcl Other (See Comments)     hallucinations/ vomitting    Codeine Other (See Comments)     Nightmares   Sertraline Hcl Other (See Comments)     GI upset/Vomiting    Naproxen Sodium Rash     Takes Ibuprofen with no issues    Sulfamethoxazole-Trimethoprim Rash      Current Outpatient Medications on File Prior to Visit Medication Sig Dispense Refill  amLODIPine (NORVASC) 5 MG tablet TAKE 1 TABLET BY MOUTH TWICE DAILY FOR 15 DAYS      losartan (COZAAR) 50 MG tablet TAKE 1 TABLET BY MOUTH TWICE DAILY FOR 15 DAYS OVERDUE FOR OFFICE VISIT      polyethylene glycol (MIRALAX) powder Take  by mouth       No current facility-administered medications on file prior to visit.     Family History History reviewed. No pertinent family history.     Social History   Tobacco Use Smoking Status Every Day  Types: Cigarettes Smokeless Tobacco Never     Social History Social History    Socioeconomic History  Marital status: Single Tobacco Use  Smoking status: Every Day     Types: Cigarettes  Smokeless tobacco: Never Substance and Sexual Activity  Alcohol use: Yes  Drug use: Never      Objective:     There were no vitals filed for this visit.  There is no height or weight on file to calculate BMI.   Physical Exam    PE:   Constitutional: No acute distress, conversant, appears states age. Eyes: Anicteric sclerae, moist conjunctiva, no lid lag Lungs: Clear to auscultation bilaterally, normal respiratory effort CV: regular rate and rhythm, no murmurs, no peripheral edema, pedal pulses 2+ GI: Soft, no masses or hepatosplenomegaly, non-tender to palpation, patient was drain in right lower quadrant and left gluteal area.  There is some green-tinged thick murky fluid. Skin: No rashes, palpation reveals normal turgor Psychiatric: appropriate judgment and insight, oriented to person, place, and time       Labs, Imaging and Diagnostic Testing:   Drain  study as above     Assessment and Plan:    Diagnoses and all orders for this visit:   S/P hernia repair   Fistula       66 year old female status post laparoscopic femoral hernia pair and small bowel resection secondary to necrotic bowel. 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  Is difficult to assess on the drain study whether or not there is an actual bowel fistula.   At this point appears that this is unresolving.  We will proceed with diagnostic laparoscopy, possible ex lap, possible bowel resection lyse adhesions.   We will have the patient consented for above.  We  discussed the risk benefits of the procedure to include neurological infection, bleeding, damage structures, possible need for surgery.  Patient voiced understanding wishes to proceed.

## 2022-06-09 NOTE — H&P (Signed)
Chief Complaint: Follow-up (hernia)       History of Present Illness: Cynthia Morrison is a 66 y.o. female who is seen today for follow-up from her previous laparoscopic hernia repair and small bowel resection. Patient had multiple previous drain studies.  These appear to most recently show that she had a communication with the posterior gluteal drain in the right lower quadrant drain.  There appears to be some communication with the bowel.  Patient states that she has recently been draining some foul-smelling green liquid.  She states that she recently was seen and treated at the hospital for COVID.  She states that her sister recently died this past week.   .     Review of Systems: A complete review of systems was obtained from the patient.  I have reviewed this information and discussed as appropriate with the patient.  See HPI as well for other ROS.   ROS      Medical History: Past Medical History Past Medical History: Diagnosis Date  Anxiety    Sleep apnea        There is no problem list on file for this patient.     Past Surgical History Past Surgical History: Procedure Laterality Date  Laparoscopic Right Femoral Hernia Repair with Mesh, Exploratory Laparotomy Small Bowel Resection with Anastomosis   12/17/2021   Dr. Reda Gettis      Allergies Allergies Allergen Reactions  Buspirone Hcl Other (See Comments)     hallucinations/ vomitting    Codeine Other (See Comments)     Nightmares   Sertraline Hcl Other (See Comments)     GI upset/Vomiting    Naproxen Sodium Rash     Takes Ibuprofen with no issues    Sulfamethoxazole-Trimethoprim Rash      Current Outpatient Medications on File Prior to Visit Medication Sig Dispense Refill  amLODIPine (NORVASC) 5 MG tablet TAKE 1 TABLET BY MOUTH TWICE DAILY FOR 15 DAYS      losartan (COZAAR) 50 MG tablet TAKE 1 TABLET BY MOUTH TWICE DAILY FOR 15 DAYS OVERDUE FOR OFFICE VISIT      polyethylene glycol (MIRALAX) powder Take  by mouth       No current facility-administered medications on file prior to visit.     Family History History reviewed. No pertinent family history.     Social History   Tobacco Use Smoking Status Every Day  Types: Cigarettes Smokeless Tobacco Never     Social History Social History    Socioeconomic History  Marital status: Single Tobacco Use  Smoking status: Every Day     Types: Cigarettes  Smokeless tobacco: Never Substance and Sexual Activity  Alcohol use: Yes  Drug use: Never      Objective:     There were no vitals filed for this visit.  There is no height or weight on file to calculate BMI.   Physical Exam    PE:   Constitutional: No acute distress, conversant, appears states age. Eyes: Anicteric sclerae, moist conjunctiva, no lid lag Lungs: Clear to auscultation bilaterally, normal respiratory effort CV: regular rate and rhythm, no murmurs, no peripheral edema, pedal pulses 2+ GI: Soft, no masses or hepatosplenomegaly, non-tender to palpation, patient was drain in right lower quadrant and left gluteal area.  There is some green-tinged thick murky fluid. Skin: No rashes, palpation reveals normal turgor Psychiatric: appropriate judgment and insight, oriented to person, place, and time       Labs, Imaging and Diagnostic Testing:   Drain   study as above     Assessment and Plan:    Diagnoses and all orders for this visit:   S/P hernia repair   Fistula       66 year old female status post laparoscopic femoral hernia pair and small bowel resection secondary to necrotic bowel. 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  Is difficult to assess on the drain study whether or not there is an actual bowel fistula.   At this point appears that this is unresolving.  We will proceed with diagnostic laparoscopy, possible ex lap, possible bowel resection lyse adhesions.   We will have the patient consented for above.  We  discussed the risk benefits of the procedure to include neurological infection, bleeding, damage structures, possible need for surgery.  Patient voiced understanding wishes to proceed.

## 2022-06-27 NOTE — Pre-Procedure Instructions (Signed)
Surgical Instructions    Your procedure is scheduled on Monday, October 16.  Report to [facility] Main Entrance "A" at 9:00 A.M., then check in with the Admitting office.  Call this number if you have problems the morning of surgery:  [phone]   If you have any questions prior to your surgery date call [phone]: Open Monday-Friday 8am-4pm If you experience any cold or flu symptoms such as cough, fever, chills, shortness of breath, etc. between now and your scheduled surgery, please notify us at the above number     Remember:  Do not eat after midnight the night before your surgery  You may drink clear liquids until 8:00AM the morning of your surgery.   Clear liquids allowed are: Water, Non-Citrus Juices (without pulp), Carbonated Beverages, Clear Tea, Black Coffee ONLY (NO MILK, CREAM OR POWDERED CREAMER of any kind), and Gatorade  Patient Instructions  The night before surgery: Drink TWO (2) Pre-Surgery Clear Ensure the night before surgery. Drink in one sitting. Do not sip.  This drink was given to you during your hospital  pre-op appointment visit.  No food after midnight. ONLY clear liquids after midnight  The day of surgery (if you do NOT have diabetes):  Drink ONE (1) Pre-Surgery Clear Ensure by 8:00AM the morning of surgery. Drink in one sitting. Do not sip.  This drink was given to you during your hospital  pre-op appointment visit.  Nothing else to drink after completing the  Pre-Surgery Clear Ensure.           If you have questions, please contact your surgeon's office.     Take these medicines the morning of surgery with A SIP OF WATER:  amLODipine (NORVASC) cephALEXin (KEFLEX)  ondansetron (ZOFRAN-ODT)  if needed   As of today, STOP taking any Aspirin (unless otherwise instructed by your surgeon) Aleve, Naproxen, Ibuprofen, Motrin, Advil, Goody's, BC's, all herbal medications, fish oil, and all vitamins.            [place] is not responsible  for any belongings or valuables.    Do NOT Smoke (Tobacco/Vaping)  24 hours prior to your procedure  If you use a CPAP at night, you may bring your mask for your overnight stay.   Contacts, glasses, hearing aids, dentures or partials may not be worn into surgery, please bring cases for these belongings   For patients admitted to the hospital, discharge time will be determined by your treatment team.   Patients discharged the day of surgery will not be allowed to drive home, and someone needs to stay with them for 24 hours.   SURGICAL WAITING ROOM VISITATION Patients having surgery or a procedure may have no more than 2 support people in the waiting area - these visitors may rotate.   Children under the age of 54 must have an adult with them who is not the patient. If the patient needs to stay at the hospital during part of their recovery, the visitor guidelines for inpatient rooms apply. Pre-op nurse will coordinate an appropriate time for 1 support person to accompany patient in pre-op.  This support person may not rotate.   Please refer to the [facility] website for the visitor guidelines for Inpatients (after your surgery is over and you are in a regular room).    Special instructions:    Oral Hygiene is also important to reduce your risk of infection.  Remember - BRUSH YOUR TEETH THE MORNING OF SURGERY WITH YOUR REGULAR TOOTHPASTE  [place]- Preparing For Surgery  Before surgery, you can play an important role. Because skin is not sterile, your skin needs to be as free of germs as possible. You can reduce the number of germs on your skin by washing with CHG (chlorahexidine gluconate) Soap before surgery.  CHG is an antiseptic cleaner which kills germs and bonds with the skin to continue killing germs even after washing.     Please do not use if you have an allergy to CHG or antibacterial soaps. If your skin becomes reddened/irritated stop using the CHG.  Do not shave  (including legs and underarms) for at least 48 hours prior to first CHG shower. It is OK to shave your face.  Please follow these instructions carefully.     Shower the NIGHT BEFORE SURGERY and the MORNING OF SURGERY with CHG Soap.   If you chose to wash your hair, wash your hair first as usual with your normal shampoo. After you shampoo, rinse your hair and body thoroughly to remove the shampoo.  Then [employer] and genitals (private parts) with your normal soap and rinse thoroughly to remove soap.  After that Use CHG Soap as you would any other liquid soap. You can apply CHG directly to the skin and wash gently with a scrungie or a clean washcloth.   Apply the CHG Soap to your body ONLY FROM THE NECK DOWN.  Do not use on open wounds or open sores. Avoid contact with your eyes, ears, mouth and genitals (private parts). Wash Face and genitals (private parts)  with your normal soap.   Wash thoroughly, paying special attention to the area where your surgery will be performed.  Thoroughly rinse your body with warm water from the neck down.  DO NOT shower/wash with your normal soap after using and rinsing off the CHG Soap.  Pat yourself dry with a CLEAN TOWEL.  Wear CLEAN PAJAMAS to bed the night before surgery  Place CLEAN SHEETS on your bed the night before your surgery  DO NOT SLEEP WITH PETS.   Day of Surgery:  Take a shower with CHG soap. Wear Clean/Comfortable clothing the morning of surgery  Do not wear jewelry or makeup. Do not wear lotions, powders, perfumes/cologne or deodorant. Do not shave 48 hours prior to surgery.  Men may shave face and neck. Do not bring valuables to the hospital. Do not wear nail polish, gel polish, artificial nails, or any other type of covering on natural nails (fingers and toes) If you have artificial nails or gel coating that need to be removed by a nail salon, please have this removed prior to surgery. Artificial nails or gel coating may  interfere with anesthesia's ability to adequately monitor your vital signs.   Remember to brush your teeth WITH YOUR REGULAR TOOTHPASTE.    If you received a COVID test during your pre-op visit, it is requested that you wear a mask when out in public, stay away from anyone that may not be feeling well, and notify your surgeon if you develop symptoms. If you have been in contact with anyone that has tested positive in the last 10 days, please notify your surgeon.    Please read over the following fact sheets that you were given.

## 2022-06-30 ENCOUNTER — Encounter (HOSPITAL_COMMUNITY)
Admission: RE | Admit: 2022-06-30 | Discharge: 2022-06-30 | Disposition: A | Discharge status: Home / Self Care | Payer: BC Managed Care – PPO | Source: Ambulatory Visit | Attending: General Surgery | Admitting: General Surgery

## 2022-06-30 ENCOUNTER — Encounter (HOSPITAL_COMMUNITY): Payer: Self-pay

## 2022-06-30 ENCOUNTER — Other Ambulatory Visit: Payer: Self-pay

## 2022-06-30 DIAGNOSIS — T8183XD Persistent postprocedural fistula, subsequent encounter: Secondary | ICD-10-CM

## 2022-06-30 DIAGNOSIS — I10 Essential (primary) hypertension: Secondary | ICD-10-CM | POA: Diagnosis not present

## 2022-06-30 DIAGNOSIS — Z01818 Encounter for other preprocedural examination: Secondary | ICD-10-CM | POA: Insufficient documentation

## 2022-06-30 LAB — COMPREHENSIVE METABOLIC PANEL
ALT: 12 U/L (ref 0–44)
AST: 21 U/L (ref 15–41)
Albumin: 3.8 g/dL (ref 3.5–5.0)
Alkaline Phosphatase: 100 U/L (ref 38–126)
Anion gap: 10 (ref 5–15)
BUN: 10 mg/dL (ref 8–23)
CO2: 23 mmol/L (ref 22–32)
Calcium: 9 mg/dL (ref 8.9–10.3)
Chloride: 107 mmol/L (ref 98–111)
Creatinine, Ser: 0.97 mg/dL (ref 0.44–1.00)
GFR, Estimated: >60 mL/min (ref >60)
Glucose, Bld: 97 mg/dL (ref 70–99)
Potassium: 3.7 mmol/L (ref 3.5–5.1)
Sodium: 140 mmol/L (ref 135–145)
Total Bilirubin: 0.6 mg/dL (ref 0.3–1.2)
Total Protein: 7 g/dL (ref 6.5–8.1)

## 2022-06-30 LAB — CBC WITH DIFFERENTIAL/PLATELET
Abs Immature Granulocytes: 0.01 10*3/uL (ref 0.00–0.07)
Basophils Absolute: 0.1 10*3/uL (ref 0.0–0.1)
Basophils Relative: 1 %
Eosinophils Absolute: 0.1 10*3/uL (ref 0.0–0.5)
Eosinophils Relative: 1 %
HCT: 43.6 % (ref 36.0–46.0)
Hemoglobin: 14.5 g/dL (ref 12.0–15.0)
Immature Granulocytes: 0 %
Lymphocytes Relative: 28 %
Lymphs Abs: 2.3 10*3/uL (ref 0.7–4.0)
MCH: 31.9 pg (ref 26.0–34.0)
MCHC: 33.3 g/dL (ref 30.0–36.0)
MCV: 95.8 fL (ref 80.0–100.0)
Monocytes Absolute: 0.5 10*3/uL (ref 0.1–1.0)
Monocytes Relative: 6 %
Neutro Abs: 5.2 10*3/uL (ref 1.7–7.7)
Neutrophils Relative %: 64 %
Platelets: 293 10*3/uL (ref 150–400)
RBC: 4.55 MIL/uL (ref 3.87–5.11)
RDW: 15.5 % (ref 11.5–15.5)
WBC: 8.1 10*3/uL (ref 4.0–10.5)
nRBC: 0 % (ref 0.0–0.2)

## 2022-06-30 NOTE — Progress Notes (Addendum)
Anesthesia Chart Review:  History of hypertension, which has been recently uncontrolled.  Patient reports compliance with her antihypertensive medications -losartan and amlodipine.  She does have a home blood pressure monitor, however, she has not been using it.  She reports a significant amount of stress about her upcoming surgery.  She denies any headache, chest pain, palpitations, shortness of breath.  Blood pressure is managed by her PCP who unfortunately has recently left practice.  She has previously been seen at [facility] family medicine and was advised to call today about getting into see a new provider ASAP.  She understands that her current blood pressure would likely preclude surgery.  She is advised that if she develops any symptoms she is to be seen in urgent care or the emergency department. I advised her I will call to followup.  BP Readings from Last 3 Encounters:  06/30/22 (!) 215/111  05/28/22 (!) 180/111  05/22/22 (!) 142/83    Per IR note, pt with recent history of incarcerated right inguinal hernia s/p mesh repair and ex lap on 12/17/21. Hospital course complicated by multiple intra abdominal abscesses. IR placed several abscess drains including a pelvic abscess drain via a left trans gluteal approach on 12/24/21. On 01/07/22 IR placed a right inguinal and RLQ abscess drain.  Per surgical notes dated 5.20.24 Patient has all drains removed with the exception of the left trans gluteal abscess drain. Last abscessogram performed on 04/10/22 reads left trans gluteal abscess drain catheter with suspected communication to endometrial cavity and right fallopian tube as well as fistula to collection in the region of the right inguinal hernia repair. The area where the prior trans gluteal drain was enlarging. 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  Patient was referred back to IR for drain placement.  Right inguinal 10 French drain was placed within the  loculated air collection on 04/14/2022 in IR.  I called the pt to followup on 07/01/22. She stated she was seen by Genelle Bal, FNP at [place] earlier today and her amlodipine and losartan were refilled. No new medications were added. She did tell me today that she had been out of both of these medications for about a month, which was not clear to me at her PAT appointment on 10/9, and there appears to have been some confusion regarding whether or not she was taking the medication at that time. She states she has now restarted both medications. BP continues to be elevated, 196/127 at last check. I advised her to keep a log of her BP and I will call her back on 10/12 to reassess. Ability to proceed with surgery pending improvement in BP.  Preop labs reviewed, WNL.  Addendum 07/03/22: I called and spoke with the patient to see how her blood pressures been doing.  She reports significant improvement.  Last 2 readings were 151/103 and 147/102.  While these are still not optimized, they are markedly improved since restarting her blood pressure medications.  She will continue to monitor and record her blood pressure.  Based on his most recent readings, anticipate she can proceed as planned barring acute status change.  EKG 05/22/22: NSR. Rate 80.    Wynonia Musty [facility]/Anesthesiology Phone [phone] 07/01/2022 3:17 PM

## 2022-06-30 NOTE — Pre-Procedure Instructions (Signed)
Surgical Instructions    Your procedure is scheduled on Monday, October 16.  Report to [facility] Main Entrance "A" at 9:00 A.M., then check in with the Admitting office.  Call this number if you have problems the morning of surgery:  [phone]   If you have any questions prior to your surgery date call [phone]: Open Monday-Friday 8am-4pm If you experience any cold or flu symptoms such as cough, fever, chills, shortness of breath, etc. between now and your scheduled surgery, please notify us at the above number     Remember:  Do not eat after midnight the night before your surgery  You may drink clear liquids until 8:00AM the morning of your surgery.   Clear liquids allowed are: Water, Non-Citrus Juices (without pulp), Carbonated Beverages, Clear Tea, Black Coffee ONLY (NO MILK, CREAM OR POWDERED CREAMER of any kind), and Gatorade  Patient Instructions  The night before surgery: Drink TWO (2) Pre-Surgery Clear Ensure the night before surgery. Drink in one sitting. Do not sip.  This drink was given to you during your hospital  pre-op appointment visit.  No food after midnight. ONLY clear liquids after midnight  The day of surgery (if you do NOT have diabetes):  Drink ONE (1) Pre-Surgery Clear Ensure by 8:00AM the morning of surgery. Drink in one sitting. Do not sip.  This drink was given to you during your hospital  pre-op appointment visit.  Nothing else to drink after completing the  Pre-Surgery Clear Ensure.           If you have questions, please contact your surgeon's office.     Take these medicines the morning of surgery with A SIP OF WATER:  amLODipine (NORVASC)   As of today, STOP taking any Aspirin (unless otherwise instructed by your surgeon) Aleve, Naproxen, Ibuprofen, Motrin, Advil, Goody's, BC's, all herbal medications, fish oil, and all vitamins.            [facility] is not responsible for any belongings or valuables.    Do NOT Smoke  (Tobacco/Vaping)  24 hours prior to your procedure  If you use a CPAP at night, you may bring your mask for your overnight stay.   Contacts, glasses, hearing aids, dentures or partials may not be worn into surgery, please bring cases for these belongings   For patients admitted to the hospital, discharge time will be determined by your treatment team.   Patients discharged the day of surgery will not be allowed to drive home, and someone needs to stay with them for 24 hours.   SURGICAL WAITING ROOM VISITATION Patients having surgery or a procedure may have no more than 2 support people in the waiting area - these visitors may rotate.   Children under the age of 68 must have an adult with them who is not the patient. If the patient needs to stay at the hospital during part of their recovery, the visitor guidelines for inpatient rooms apply. Pre-op nurse will coordinate an appropriate time for 1 support person to accompany patient in pre-op.  This support person may not rotate.   Please refer to the [facility] website for the visitor guidelines for Inpatients (after your surgery is over and you are in a regular room).    Special instructions:    Oral Hygiene is also important to reduce your risk of infection.  Remember - BRUSH YOUR TEETH THE MORNING OF SURGERY WITH YOUR REGULAR TOOTHPASTE   [facility]- Preparing For Surgery  Before  surgery, you can play an important role. Because skin is not sterile, your skin needs to be as free of germs as possible. You can reduce the number of germs on your skin by washing with CHG (chlorahexidine gluconate) Soap before surgery.  CHG is an antiseptic cleaner which kills germs and bonds with the skin to continue killing germs even after washing.     Please do not use if you have an allergy to CHG or antibacterial soaps. If your skin becomes reddened/irritated stop using the CHG.  Do not shave (including legs and underarms) for at least 48 hours prior  to first CHG shower. It is OK to shave your face.  Please follow these instructions carefully.     Shower the NIGHT BEFORE SURGERY and the MORNING OF SURGERY with CHG Soap.   If you chose to wash your hair, wash your hair first as usual with your normal shampoo. After you shampoo, rinse your hair and body thoroughly to remove the shampoo.  Then [employer] and genitals (private parts) with your normal soap and rinse thoroughly to remove soap.  After that Use CHG Soap as you would any other liquid soap. You can apply CHG directly to the skin and wash gently with a scrungie or a clean washcloth.   Apply the CHG Soap to your body ONLY FROM THE NECK DOWN.  Do not use on open wounds or open sores. Avoid contact with your eyes, ears, mouth and genitals (private parts). Wash Face and genitals (private parts)  with your normal soap.   Wash thoroughly, paying special attention to the area where your surgery will be performed.  Thoroughly rinse your body with warm water from the neck down.  DO NOT shower/wash with your normal soap after using and rinsing off the CHG Soap.  Pat yourself dry with a CLEAN TOWEL.  Wear CLEAN PAJAMAS to bed the night before surgery  Place CLEAN SHEETS on your bed the night before your surgery  DO NOT SLEEP WITH PETS.   Day of Surgery:  Take a shower with CHG soap. Wear Clean/Comfortable clothing the morning of surgery  Do not wear jewelry or makeup. Do not wear lotions, powders, perfumes/cologne or deodorant. Do not shave 48 hours prior to surgery.  Men may shave face and neck. Do not bring valuables to the hospital. Do not wear nail polish, gel polish, artificial nails, or any other type of covering on natural nails (fingers and toes) If you have artificial nails or gel coating that need to be removed by a nail salon, please have this removed prior to surgery. Artificial nails or gel coating may interfere with anesthesia's ability to adequately monitor your  vital signs.   Remember to brush your teeth WITH YOUR REGULAR TOOTHPASTE.    If you received a COVID test during your pre-op visit, it is requested that you wear a mask when out in public, stay away from anyone that may not be feeling well, and notify your surgeon if you develop symptoms. If you have been in contact with anyone that has tested positive in the last 10 days, please notify your surgeon.    Please read over the following fact sheets that you were given.

## 2022-06-30 NOTE — Progress Notes (Signed)
PCP - [place] Cardiologist - Denies  PPM/ICD - Denies  Chest x-ray - N/A EKG - 05/23/22 Stress Test - Denies ECHO - Denies Cardiac Cath - Denies   Sleep Study - Denies  Diabetes: Denies  Blood Thinner Instructions: N/A Aspirin Instructions: N/A  ERAS Protcol - Yes, PRE-SURGERY 2 the night before, 1 the morning of surgery.  COVID TEST- N/A  Patient had elevated BP's upon arrival to PAT appt (180/100). Continued with appt and rechecked after. BP recheck was 215/111. Spoke with Jeneen Rinks, [place] in anesthesia. Gave pt instructions to check BP 3x daily until surgery and to also call PCP asap about pressures.  Patient also given the pharmacy call center number so they can verify her medications.   Anesthesia review: Yes, elevated BP's  Patient denies shortness of breath, fever, cough and chest pain at PAT appointment   All instructions explained to the patient, with a verbal understanding of the material. Patient agrees to go over the instructions while at home for a better understanding. Patient also instructed to self quarantine after being tested for COVID-19. The opportunity to ask questions was provided.

## 2022-07-01 DIAGNOSIS — L659 Nonscarring hair loss, unspecified: Secondary | ICD-10-CM | POA: Diagnosis not present

## 2022-07-01 DIAGNOSIS — I10 Essential (primary) hypertension: Secondary | ICD-10-CM | POA: Diagnosis not present

## 2022-07-01 DIAGNOSIS — R7309 Other abnormal glucose: Secondary | ICD-10-CM | POA: Diagnosis not present

## 2022-07-01 DIAGNOSIS — F322 Major depressive disorder, single episode, severe without psychotic features: Secondary | ICD-10-CM | POA: Diagnosis not present

## 2022-07-01 DIAGNOSIS — N1831 Chronic kidney disease, stage 3a: Secondary | ICD-10-CM | POA: Diagnosis not present

## 2022-07-03 NOTE — Anesthesia Preprocedure Evaluation (Addendum)
Anesthesia Evaluation  Patient identified by MRN, date of birth, ID band Patient awake    Reviewed: Allergy & Precautions, NPO status , Patient's Chart, lab work & pertinent test results  Airway Mallampati: II  TM Distance: >3 FB Neck ROM: Full    Dental  (+) Edentulous Upper, Edentulous Lower   Pulmonary former smoker   Pulmonary exam normal        Cardiovascular hypertension, Pt. on medications Normal cardiovascular exam     Neuro/Psych   Anxiety     negative neurological ROS     GI/Hepatic negative GI ROS, Neg liver ROS,,,  Endo/Other  negative endocrine ROS    Renal/GU negative Renal ROS     Musculoskeletal negative musculoskeletal ROS (+)    Abdominal   Peds  Hematology negative hematology ROS (+)   Anesthesia Other Findings PERSISTENT FISTULA  Reproductive/Obstetrics                             Anesthesia Physical Anesthesia Plan  ASA: 2  Anesthesia Plan: General   Post-op Pain Management:    Induction: Intravenous  PONV Risk Score and Plan: 3 and Ondansetron, Dexamethasone, Midazolam and Treatment may vary due to age or medical condition  Airway Management Planned: Oral ETT  Additional Equipment:   Intra-op Plan:   Post-operative Plan: Extubation in OR  Informed Consent: I have reviewed the patients History and Physical, chart, labs and discussed the procedure including the risks, benefits and alternatives for the proposed anesthesia with the patient or authorized representative who has indicated his/her understanding and acceptance.       Plan Discussed with: CRNA  Anesthesia Plan Comments: (PAT note 06/30/22 by Karoline Caldwell, PA-C.  )        Anesthesia Quick Evaluation

## 2022-07-04 NOTE — Progress Notes (Signed)
Patient was called to be informed that the surgery time for Monday was changed. Patient didn't answer to the phone and unable to received messages. This Probation officer called patient's daughter Lorriane Shire and informed her that the surgery time for Cynthia Morrison for Monday was changed from 11:05 to 13:35 o'clock. The patient must be at the hospital at 11:00 o'clock and also the patient can have clear liquids until 10:30 o'clock. Mrs. Lorriane Shire verbalized understanding and she said that she will let her mother know.

## 2022-07-07 ENCOUNTER — Other Ambulatory Visit: Payer: Self-pay

## 2022-07-07 ENCOUNTER — Encounter (HOSPITAL_COMMUNITY)
Admission: RE | Disposition: A | Discharge status: Home / Self Care | Payer: Self-pay | Source: Home / Self Care | Attending: General Surgery

## 2022-07-07 ENCOUNTER — Inpatient Hospital Stay (HOSPITAL_COMMUNITY)
Admission: RE | Admit: 2022-07-07 | Discharge: 2022-07-10 | DRG: 908 | Disposition: A | Discharge status: Home / Self Care | Payer: BC Managed Care – PPO | Attending: General Surgery | Admitting: General Surgery

## 2022-07-07 ENCOUNTER — Inpatient Hospital Stay (HOSPITAL_COMMUNITY): Payer: BC Managed Care – PPO | Admitting: Physician Assistant

## 2022-07-07 ENCOUNTER — Encounter (HOSPITAL_COMMUNITY): Payer: Self-pay | Admitting: General Surgery

## 2022-07-07 ENCOUNTER — Inpatient Hospital Stay (HOSPITAL_COMMUNITY): Payer: BC Managed Care – PPO | Admitting: Certified Registered Nurse Anesthetist

## 2022-07-07 DIAGNOSIS — Z885 Allergy status to narcotic agent status: Secondary | ICD-10-CM

## 2022-07-07 DIAGNOSIS — Z882 Allergy status to sulfonamides status: Secondary | ICD-10-CM

## 2022-07-07 DIAGNOSIS — Z5331 Laparoscopic surgical procedure converted to open procedure: Secondary | ICD-10-CM

## 2022-07-07 DIAGNOSIS — K632 Fistula of intestine: Secondary | ICD-10-CM | POA: Diagnosis present

## 2022-07-07 DIAGNOSIS — N828 Other female genital tract fistulae: Secondary | ICD-10-CM | POA: Diagnosis not present

## 2022-07-07 DIAGNOSIS — Z886 Allergy status to analgesic agent status: Secondary | ICD-10-CM | POA: Diagnosis not present

## 2022-07-07 DIAGNOSIS — Y836 Removal of other organ (partial) (total) as the cause of abnormal reaction of the patient, or of later complication, without mention of misadventure at the time of the procedure: Secondary | ICD-10-CM | POA: Diagnosis present

## 2022-07-07 DIAGNOSIS — K429 Umbilical hernia without obstruction or gangrene: Secondary | ICD-10-CM | POA: Diagnosis present

## 2022-07-07 DIAGNOSIS — F17210 Nicotine dependence, cigarettes, uncomplicated: Secondary | ICD-10-CM | POA: Diagnosis present

## 2022-07-07 DIAGNOSIS — Z79899 Other long term (current) drug therapy: Secondary | ICD-10-CM | POA: Diagnosis not present

## 2022-07-07 DIAGNOSIS — F419 Anxiety disorder, unspecified: Secondary | ICD-10-CM | POA: Diagnosis not present

## 2022-07-07 DIAGNOSIS — T8183XA Persistent postprocedural fistula, initial encounter: Secondary | ICD-10-CM | POA: Diagnosis not present

## 2022-07-07 DIAGNOSIS — G4730 Sleep apnea, unspecified: Secondary | ICD-10-CM | POA: Diagnosis present

## 2022-07-07 DIAGNOSIS — Z9889 Other specified postprocedural states: Principal | ICD-10-CM

## 2022-07-07 DIAGNOSIS — Z888 Allergy status to other drugs, medicaments and biological substances status: Secondary | ICD-10-CM | POA: Diagnosis not present

## 2022-07-07 DIAGNOSIS — K660 Peritoneal adhesions (postprocedural) (postinfection): Secondary | ICD-10-CM | POA: Diagnosis not present

## 2022-07-07 DIAGNOSIS — Z8719 Personal history of other diseases of the digestive system: Secondary | ICD-10-CM | POA: Diagnosis not present

## 2022-07-07 HISTORY — PX: BOWEL RESECTION: SHX1257

## 2022-07-07 HISTORY — PX: LAPAROTOMY: SHX154

## 2022-07-07 HISTORY — PX: LAPAROSCOPY: SHX197

## 2022-07-07 SURGERY — LAPAROSCOPY, DIAGNOSTIC
Anesthesia: General | Site: Abdomen

## 2022-07-07 MED ORDER — SUCCINYLCHOLINE CHLORIDE 200 MG/10ML IV SOSY
PREFILLED_SYRINGE | INTRAVENOUS | Status: AC
  Filled 2022-07-07: qty 10

## 2022-07-07 MED ORDER — BUPIVACAINE-EPINEPHRINE (PF) 0.25% -1:200000 IJ SOLN
INTRAMUSCULAR | Status: AC
  Filled 2022-07-07: qty 30

## 2022-07-07 MED ORDER — 0.9 % SODIUM CHLORIDE (POUR BTL) OPTIME
TOPICAL | Status: DC | PRN
  Administered 2022-07-07: 1000 mL

## 2022-07-07 MED ORDER — ENOXAPARIN SODIUM 40 MG/0.4ML IJ SOSY
40.0000 mg | PREFILLED_SYRINGE | INTRAMUSCULAR | Status: DC
  Administered 2022-07-08 – 2022-07-10 (×3): 40 mg via SUBCUTANEOUS
  Filled 2022-07-07 (×3): qty 0.4

## 2022-07-07 MED ORDER — DEXTROSE-NACL 5-0.9 % IV SOLN: INTRAVENOUS | Status: DC

## 2022-07-07 MED ORDER — ONDANSETRON HCL 4 MG/2ML IJ SOLN
INTRAMUSCULAR | Status: DC | PRN
  Administered 2022-07-07: 4 mg via INTRAVENOUS

## 2022-07-07 MED ORDER — PHENYLEPHRINE 80 MCG/ML (10ML) SYRINGE FOR IV PUSH (FOR BLOOD PRESSURE SUPPORT)
PREFILLED_SYRINGE | INTRAVENOUS | Status: DC | PRN
  Administered 2022-07-07 (×2): 160 ug via INTRAVENOUS

## 2022-07-07 MED ORDER — EPHEDRINE 5 MG/ML INJ
INTRAVENOUS | Status: AC
  Filled 2022-07-07: qty 5

## 2022-07-07 MED ORDER — CHLORHEXIDINE GLUCONATE 0.12 % MT SOLN
OROMUCOSAL | Status: AC
  Administered 2022-07-07: 15 mL via OROMUCOSAL
  Filled 2022-07-07: qty 15

## 2022-07-07 MED ORDER — SODIUM CHLORIDE 0.9 % IV SOLN
INTRAVENOUS | Status: AC
  Filled 2022-07-07: qty 2

## 2022-07-07 MED ORDER — CHLORHEXIDINE GLUCONATE CLOTH 2 % EX PADS: 6.0000 | MEDICATED_PAD | Freq: Once | CUTANEOUS | Status: DC

## 2022-07-07 MED ORDER — ACETAMINOPHEN 500 MG PO TABS
ORAL_TABLET | ORAL | Status: AC
  Administered 2022-07-07: 1000 mg via ORAL
  Filled 2022-07-07: qty 2

## 2022-07-07 MED ORDER — LIDOCAINE 2% (20 MG/ML) 5 ML SYRINGE
INTRAMUSCULAR | Status: DC | PRN
  Administered 2022-07-07: 60 mg via INTRAVENOUS

## 2022-07-07 MED ORDER — PROPOFOL 10 MG/ML IV BOLUS
INTRAVENOUS | Status: AC
  Filled 2022-07-07: qty 20

## 2022-07-07 MED ORDER — OXYCODONE HCL 5 MG PO TABS
5.0000 mg | ORAL_TABLET | ORAL | Status: DC | PRN
  Administered 2022-07-08: 5 mg via ORAL
  Administered 2022-07-08 – 2022-07-09 (×3): 10 mg via ORAL
  Administered 2022-07-10: 5 mg via ORAL
  Filled 2022-07-07 (×2): qty 2
  Filled 2022-07-07: qty 1
  Filled 2022-07-07 (×2): qty 2

## 2022-07-07 MED ORDER — TRAMADOL HCL 50 MG PO TABS: 50.0000 mg | ORAL_TABLET | Freq: Four times a day (QID) | ORAL | Status: DC | PRN

## 2022-07-07 MED ORDER — ONDANSETRON HCL 4 MG/2ML IJ SOLN
INTRAMUSCULAR | Status: AC
  Filled 2022-07-07: qty 6

## 2022-07-07 MED ORDER — ONDANSETRON 4 MG PO TBDP: 4.0000 mg | ORAL_TABLET | Freq: Four times a day (QID) | ORAL | Status: DC | PRN

## 2022-07-07 MED ORDER — FENTANYL CITRATE (PF) 250 MCG/5ML IJ SOLN
INTRAMUSCULAR | Status: AC
  Filled 2022-07-07: qty 5

## 2022-07-07 MED ORDER — LIDOCAINE 2% (20 MG/ML) 5 ML SYRINGE
INTRAMUSCULAR | Status: AC
  Filled 2022-07-07: qty 10

## 2022-07-07 MED ORDER — ENSURE PRE-SURGERY PO LIQD: 592.0000 mL | Freq: Once | ORAL | Status: DC

## 2022-07-07 MED ORDER — PROPOFOL 10 MG/ML IV BOLUS
INTRAVENOUS | Status: DC | PRN
  Administered 2022-07-07: 110 mg via INTRAVENOUS

## 2022-07-07 MED ORDER — CHLORHEXIDINE GLUCONATE 0.12 % MT SOLN: 15.0000 mL | Freq: Once | OROMUCOSAL | Status: AC

## 2022-07-07 MED ORDER — LACTATED RINGERS IV SOLN: INTRAVENOUS | Status: DC | PRN

## 2022-07-07 MED ORDER — PROMETHAZINE HCL 25 MG/ML IJ SOLN: 6.2500 mg | INTRAMUSCULAR | Status: DC | PRN

## 2022-07-07 MED ORDER — PHENYLEPHRINE HCL-NACL 20-0.9 MG/250ML-% IV SOLN
INTRAVENOUS | Status: DC | PRN
  Administered 2022-07-07: 40 ug/min via INTRAVENOUS

## 2022-07-07 MED ORDER — ROCURONIUM BROMIDE 10 MG/ML (PF) SYRINGE
PREFILLED_SYRINGE | INTRAVENOUS | Status: DC | PRN
  Administered 2022-07-07 (×2): 20 mg via INTRAVENOUS
  Administered 2022-07-07: 10 mg via INTRAVENOUS
  Administered 2022-07-07: 40 mg via INTRAVENOUS

## 2022-07-07 MED ORDER — DEXAMETHASONE SODIUM PHOSPHATE 10 MG/ML IJ SOLN
INTRAMUSCULAR | Status: AC
  Filled 2022-07-07: qty 3

## 2022-07-07 MED ORDER — ONDANSETRON HCL 4 MG/2ML IJ SOLN
INTRAMUSCULAR | Status: AC
  Filled 2022-07-07: qty 2

## 2022-07-07 MED ORDER — DEXAMETHASONE SODIUM PHOSPHATE 10 MG/ML IJ SOLN
INTRAMUSCULAR | Status: AC
  Filled 2022-07-07: qty 1

## 2022-07-07 MED ORDER — LACTATED RINGERS IV SOLN: INTRAVENOUS | Status: DC

## 2022-07-07 MED ORDER — DEXAMETHASONE SODIUM PHOSPHATE 10 MG/ML IJ SOLN
INTRAMUSCULAR | Status: DC | PRN
  Administered 2022-07-07: 10 mg via INTRAVENOUS

## 2022-07-07 MED ORDER — FENTANYL CITRATE (PF) 100 MCG/2ML IJ SOLN
INTRAMUSCULAR | Status: AC
  Filled 2022-07-07: qty 2

## 2022-07-07 MED ORDER — FENTANYL CITRATE (PF) 250 MCG/5ML IJ SOLN
INTRAMUSCULAR | Status: DC | PRN
  Administered 2022-07-07: 150 ug via INTRAVENOUS
  Administered 2022-07-07 (×2): 50 ug via INTRAVENOUS

## 2022-07-07 MED ORDER — BUPIVACAINE HCL 0.25 % IJ SOLN
INTRAMUSCULAR | Status: DC | PRN
  Administered 2022-07-07: 7 mL

## 2022-07-07 MED ORDER — FENTANYL CITRATE (PF) 100 MCG/2ML IJ SOLN
25.0000 ug | INTRAMUSCULAR | Status: DC | PRN
  Administered 2022-07-07: 50 ug via INTRAVENOUS

## 2022-07-07 MED ORDER — EPHEDRINE SULFATE-NACL 50-0.9 MG/10ML-% IV SOSY
PREFILLED_SYRINGE | INTRAVENOUS | Status: DC | PRN
  Administered 2022-07-07: 5 mg via INTRAVENOUS
  Administered 2022-07-07: 10 mg via INTRAVENOUS
  Administered 2022-07-07 (×2): 5 mg via INTRAVENOUS

## 2022-07-07 MED ORDER — SUGAMMADEX SODIUM 200 MG/2ML IV SOLN
INTRAVENOUS | Status: DC | PRN
  Administered 2022-07-07: 200 mg via INTRAVENOUS

## 2022-07-07 MED ORDER — ORAL CARE MOUTH RINSE: 15.0000 mL | Freq: Once | OROMUCOSAL | Status: AC

## 2022-07-07 MED ORDER — AMISULPRIDE (ANTIEMETIC) 5 MG/2ML IV SOLN: 10.0000 mg | Freq: Once | INTRAVENOUS | Status: DC | PRN

## 2022-07-07 MED ORDER — OXYCODONE HCL 5 MG PO TABS: 5.0000 mg | ORAL_TABLET | Freq: Once | ORAL | Status: DC | PRN

## 2022-07-07 MED ORDER — ROCURONIUM BROMIDE 10 MG/ML (PF) SYRINGE
PREFILLED_SYRINGE | INTRAVENOUS | Status: AC
  Filled 2022-07-07: qty 20

## 2022-07-07 MED ORDER — LOSARTAN POTASSIUM 50 MG PO TABS
50.0000 mg | ORAL_TABLET | Freq: Two times a day (BID) | ORAL | Status: DC
  Administered 2022-07-07 – 2022-07-10 (×6): 50 mg via ORAL
  Filled 2022-07-07 (×6): qty 1

## 2022-07-07 MED ORDER — SODIUM CHLORIDE 0.9 % IV SOLN
2.0000 g | INTRAVENOUS | Status: AC
  Administered 2022-07-07: 2 g via INTRAVENOUS

## 2022-07-07 MED ORDER — ONDANSETRON HCL 4 MG/2ML IJ SOLN: 4.0000 mg | Freq: Four times a day (QID) | INTRAMUSCULAR | Status: DC | PRN

## 2022-07-07 MED ORDER — AMITRIPTYLINE HCL 25 MG PO TABS
25.0000 mg | ORAL_TABLET | Freq: Every day | ORAL | Status: DC
  Administered 2022-07-07 – 2022-07-09 (×3): 25 mg via ORAL
  Filled 2022-07-07 (×3): qty 1

## 2022-07-07 MED ORDER — MIDAZOLAM HCL 2 MG/2ML IJ SOLN
INTRAMUSCULAR | Status: AC
  Filled 2022-07-07: qty 2

## 2022-07-07 MED ORDER — ACETAMINOPHEN 500 MG PO TABS: 1000.0000 mg | ORAL_TABLET | ORAL | Status: AC

## 2022-07-07 MED ORDER — MIDAZOLAM HCL 2 MG/2ML IJ SOLN
INTRAMUSCULAR | Status: DC | PRN
  Administered 2022-07-07: 2 mg via INTRAVENOUS

## 2022-07-07 MED ORDER — AMLODIPINE BESYLATE 5 MG PO TABS
5.0000 mg | ORAL_TABLET | Freq: Every day | ORAL | Status: DC
  Administered 2022-07-08 – 2022-07-10 (×3): 5 mg via ORAL
  Filled 2022-07-07 (×3): qty 1

## 2022-07-07 MED ORDER — HYDROMORPHONE HCL 1 MG/ML IJ SOLN
0.5000 mg | INTRAMUSCULAR | Status: DC | PRN
  Administered 2022-07-07 (×2): 0.5 mg via INTRAVENOUS
  Filled 2022-07-07 (×2): qty 0.5

## 2022-07-07 MED ORDER — ENSURE PRE-SURGERY PO LIQD: 296.0000 mL | Freq: Once | ORAL | Status: DC

## 2022-07-07 MED ORDER — OXYCODONE HCL 5 MG/5ML PO SOLN: 5.0000 mg | Freq: Once | ORAL | Status: DC | PRN

## 2022-07-07 MED ORDER — METHOCARBAMOL 500 MG PO TABS: 500.0000 mg | ORAL_TABLET | Freq: Four times a day (QID) | ORAL | Status: DC | PRN

## 2022-07-07 SURGICAL SUPPLY — 60 items
ADH SKN CLS APL DERMABOND .7 (GAUZE/BANDAGES/DRESSINGS) ×1
APL PRP STRL LF DISP 70% ISPRP (MISCELLANEOUS) ×1
BAG COUNTER SPONGE SURGICOUNT (BAG) ×1 IMPLANT
BAG SPNG CNTER NS LX DISP (BAG) ×1
CANISTER SUCT 3000ML PPV (MISCELLANEOUS) ×1 IMPLANT
CHLORAPREP W/TINT 26 (MISCELLANEOUS) ×1 IMPLANT
COVER SURGICAL LIGHT HANDLE (MISCELLANEOUS) ×1 IMPLANT
DERMABOND ADVANCED .7 DNX12 (GAUZE/BANDAGES/DRESSINGS) ×1 IMPLANT
DISSECTOR BLUNT TIP ENDO 5MM (MISCELLANEOUS) IMPLANT
DRAPE LAPAROSCOPIC ABDOMINAL (DRAPES) ×1 IMPLANT
DRAPE WARM FLUID 44X44 (DRAPES) ×1 IMPLANT
DRSG OPSITE POSTOP 4X8 (GAUZE/BANDAGES/DRESSINGS) IMPLANT
DRSG TEGADERM 2-3/8X2-3/4 SM (GAUZE/BANDAGES/DRESSINGS) IMPLANT
ELECT BLADE 6.5 EXT (BLADE) IMPLANT
ELECT CAUTERY BLADE 6.4 (BLADE) ×1 IMPLANT
ELECT REM PT RETURN 9FT ADLT (ELECTROSURGICAL) ×1
ELECTRODE REM PT RTRN 9FT ADLT (ELECTROSURGICAL) ×1 IMPLANT
GAUZE SPONGE 2X2 8PLY STRL LF (GAUZE/BANDAGES/DRESSINGS) IMPLANT
GLOVE BIO SURGEON STRL SZ7.5 (GLOVE) ×1 IMPLANT
GLOVE BIOGEL PI IND STRL 8 (GLOVE) ×1 IMPLANT
GLOVE SURG SYN 7.5  E (GLOVE) ×1
GLOVE SURG SYN 7.5 E (GLOVE) ×1 IMPLANT
GLOVE SURG SYN 7.5 PF PI (GLOVE) ×1 IMPLANT
GOWN STRL REUS W/ TWL LRG LVL3 (GOWN DISPOSABLE) ×2 IMPLANT
GOWN STRL REUS W/ TWL XL LVL3 (GOWN DISPOSABLE) ×1 IMPLANT
GOWN STRL REUS W/TWL LRG LVL3 (GOWN DISPOSABLE) ×2
GOWN STRL REUS W/TWL XL LVL3 (GOWN DISPOSABLE) ×1
HANDLE SUCTION POOLE (INSTRUMENTS) ×1 IMPLANT
IRRIG SUCT STRYKERFLOW 2 WTIP (MISCELLANEOUS) ×1
IRRIGATION SUCT STRKRFLW 2 WTP (MISCELLANEOUS) IMPLANT
KIT BASIN OR (CUSTOM PROCEDURE TRAY) ×1 IMPLANT
KIT TURNOVER KIT B (KITS) ×1 IMPLANT
NDL INSUFFLATION 14GA 120MM (NEEDLE) ×1 IMPLANT
NEEDLE INSUFFLATION 14GA 120MM (NEEDLE) ×1 IMPLANT
NS IRRIG 1000ML POUR BTL (IV SOLUTION) ×2 IMPLANT
PACK LAPAROSCOPY BASIN (CUSTOM PROCEDURE TRAY) IMPLANT
PAD ARMBOARD 7.5X6 YLW CONV (MISCELLANEOUS) ×2 IMPLANT
PENCIL SMOKE EVACUATOR (MISCELLANEOUS) ×1 IMPLANT
SCISSORS LAP 5X35 DISP (ENDOMECHANICALS) IMPLANT
SET TUBE SMOKE EVAC HIGH FLOW (TUBING) ×1 IMPLANT
SLEEVE ENDOPATH XCEL 5M (ENDOMECHANICALS) ×1 IMPLANT
SLEEVE Z-THREAD 5X100MM (TROCAR) IMPLANT
SPONGE T-LAP 18X18 ~~LOC~~+RFID (SPONGE) IMPLANT
STAPLER VISISTAT 35W (STAPLE) IMPLANT
SUCTION POOLE HANDLE (INSTRUMENTS) ×1
SUT CHROMIC 0 SH (SUTURE) IMPLANT
SUT MNCRL AB 4-0 PS2 18 (SUTURE) ×1 IMPLANT
SUT PDS AB 1 TP1 54 (SUTURE) IMPLANT
SUT SILK 2 0 SH CR/8 (SUTURE) IMPLANT
SUT SILK 2 0 TIES 10X30 (SUTURE) IMPLANT
SUT SILK 3 0 SH CR/8 (SUTURE) IMPLANT
SUT SILK 3 0 TIES 10X30 (SUTURE) ×1 IMPLANT
SYR BULB IRRIG 60ML STRL (SYRINGE) IMPLANT
TOWEL GREEN STERILE (TOWEL DISPOSABLE) ×1 IMPLANT
TOWEL GREEN STERILE FF (TOWEL DISPOSABLE) ×1 IMPLANT
TRAY FOLEY MTR SLVR 16FR STAT (SET/KITS/TRAYS/PACK) ×1 IMPLANT
TRAY LAPAROSCOPIC MC (CUSTOM PROCEDURE TRAY) ×1 IMPLANT
TROCAR XCEL NON-BLD 5MMX100MML (ENDOMECHANICALS) IMPLANT
TROCAR Z-THREAD OPTICAL 5X100M (TROCAR) ×1 IMPLANT
WARMER LAPAROSCOPE (MISCELLANEOUS) ×1 IMPLANT

## 2022-07-07 NOTE — Interval H&P Note (Signed)
History and Physical Interval Note:  07/07/2022 11:52 AM  Cynthia Morrison  has presented today for surgery, with the diagnosis of PERSISTENT FISTULA.  The various methods of treatment have been discussed with the patient and family. After consideration of risks, benefits and other options for treatment, the patient has consented to  Procedure(s): DIAGNOSTIC LAPAROSCOPY (N/A) EXPLORATORY LAPAROTOMY (N/A) SMALL BOWEL RESECTION (N/A) as a surgical intervention.  The patient's history has been reviewed, patient examined, no change in status, stable for surgery.  I have reviewed the patient's chart and labs.  Questions were answered to the patient's satisfaction.     Ralene Ok

## 2022-07-07 NOTE — Anesthesia Procedure Notes (Signed)
Procedure Name: Intubation Date/Time: 07/07/2022 12:40 PM  Performed by: Murvin Natal, MDPre-anesthesia Checklist: Patient identified, Emergency Drugs available, Suction available and Patient being monitored Patient Re-evaluated:Patient Re-evaluated prior to induction Oxygen Delivery Method: Circle system utilized Preoxygenation: Pre-oxygenation with 100% oxygen Induction Type: IV induction Ventilation: Mask ventilation without difficulty Laryngoscope Size: Mac and 3 Grade View: Grade I Tube type: Oral Tube size: 7.0 mm Number of attempts: 1 Airway Equipment and Method: Stylet Placement Confirmation: ETT inserted through vocal cords under direct vision, positive ETCO2 and breath sounds checked- equal and bilateral Secured at: 21 cm Tube secured with: Tape Dental Injury: Teeth and Oropharynx as per pre-operative assessment

## 2022-07-07 NOTE — Progress Notes (Signed)
Patient arrived to [place] room 15 alert and oriented x4. Lap sites clean dry and intact, honeycomb dressing clean dry and intact. Pain level 4/10. Bed in lowest position, call light in reach, food tray ordered. Will continue to monitor patient.

## 2022-07-07 NOTE — Transfer of Care (Signed)
Immediate Anesthesia Transfer of Care Note  Patient: Cynthia Morrison  Procedure(s) Performed: DIAGNOSTIC LAPAROSCOPY (Abdomen) EXPLORATORY LAPAROTOMY (Abdomen) LYSIS OF ADHESIONS (Abdomen)  Patient Location: PACU  Anesthesia Type:General  Level of Consciousness: drowsy and patient cooperative  Airway & Oxygen Therapy: Patient Spontanous Breathing and Patient connected to face mask oxygen  Post-op Assessment: Report given to RN and Post -op Vital signs reviewed and stable  Post vital signs: Reviewed and stable  Last Vitals:  Vitals Value Taken Time  BP 135/67 07/07/22 1500  Temp    Pulse 81 07/07/22 1501  Resp 11 07/07/22 1501  SpO2 99 % 07/07/22 1501  Vitals shown include unvalidated device data.  Last Pain:  Vitals:   07/07/22 1149  PainSc: 0-No pain         Complications: No notable events documented.

## 2022-07-07 NOTE — Op Note (Signed)
07/07/2022  2:51 PM  PATIENT:  Cynthia Morrison  66 y.o. female  PRE-OPERATIVE DIAGNOSIS:  PERSISTENT FISTULA  POST-OPERATIVE DIAGNOSIS:  PERSISTENT FISTULA  PROCEDURE:  Procedure(s): DIAGNOSTIC LAPAROSCOPY (N/A) EXPLORATORY LAPAROTOMY (N/A) LYSIS OF ADHESIONS x26min (N/A) Removal of percutaneous drain  SURGEON:  Surgeon(s) and Role:    Ralene Ok, MD - Primary  ASSISTANTS: Dr. Lossie Faes, PGY-5   ANESTHESIA:   local and general  EBL:  75 mL   BLOOD ADMINISTERED:none  DRAINS: none   LOCAL MEDICATIONS USED:  BUPIVICAINE   SPECIMEN:  No Specimen  DISPOSITION OF SPECIMEN:  N/A  COUNTS:  YES  TOURNIQUET:  * No tourniquets in log *  DICTATION: .Dragon Dictation  Indication procedure: Patient is a 66 year old female who comes in secondary to persistent fistula.  Patient had previously had a ex lap, small bowel resection and laparoscopic hernia repair secondary to strangulated bowel.  Subsequent to this patient had postoperative abscess that was draining and underwent IR fistulogram.  This was shown to have communication with the possible ovary/fallopian tube.  Patient was counseled and taken back to the operating for diagnostic laparoscopy.  Findings: Patient had omental adhesion to the midline incision.  These were taken down bluntly.  Patient also with what appeared to be a fistulous communication to the abscess at the superior portion of the uterus.  This did not seem to communicate with the longer the uterus.  This seemed to be extraperitoneal above the uterus.  This pocket was opened up and the drain was removed.  The purulence was washed out.  0 chromic's were used to reapproximate the peritoneum over the uterus.  The small bowel that was adherent to this area had several serosal tears as expected secondary to the dense adhesions.  These were set up in a Lembert fashion.  There was approximate 2 segments of small bowel that required stitches.  Patient also with  an area of the cecum that also required repair in [place] fashion.  Details of procedure: After the patient was consented patient was taken back to the OR and placed in supine position with bilateral SCDs placed.  She underwent general endotracheal intubation.  Patient was then prepped and draped in standard fashion.  A timeout was called and all facts were verified.  At this time a Veress needle technique was used insufflate the abdomen at Palmer's point.  Subsequent to this a 5 Miller trocar and camera placed intra-abdominal.  There was no injury to any intra-abdominal organs.  Second 5 Miller trocars placed in left lower quadrant direct visualization.  At this time was evident there was omental adhesions to the anterior midline.  There is also a hernia just inferior to the umbilicus to the right.  A second 5 Miller trocars placed in the supraumbilical area under direct visualization.  At this time the omental tissue was taken down with blunt dissection.  Dissection was taken over towards the right lower quadrant abdominal wall.  There was cecum as well as small bowel adherent to this area of the peritoneum.  This was taken down sharply and bluntly.  There was no cautery used.  The dissection then carried over towards the superior portion of the uterus.  There was small bowel that was densely adherent to this.  I tried to dissect this off however secondary to the adhesions I was unable to.  It was at this point I decided to continue to a laparotomy.  It should be noted that the fistulous  formation to the pelvic right abdominal wall was visualized.  The drain was not within the abdominal cavity.  At this time a #10 blade was used to make a lower midline incision.  Cautery was used to maintain hemostasis and dissection was taken down to the midline linea alba.  This was incised.  The peritoneum was entered bluntly.  At this time abdominal wall retractor was placed.  I was able to mobilize the small bowel  cephalad.  The dense adhesions to the superior portion of the uterus were sharply taken down with Metzenbaum scissors.  There appeared to be several serosal tears that were seen in the small bowel.  These were imbricated using a 3-0 silk in a numbered fashion.  The area of purulence could easily be seen.  This was opened up.  There appeared to be no communication with the lining of the uterus.  The sigmoid colon was dissected away bluntly from the posterior wall of the uterus.  There appeared to be no communication or fistula formation.  At this time a 0 chromic was used to close peritoneum over the dome of the uterus.  This was done in order fashion x2.  At this time the omentum was brought over the small bowel and sutured to the peritoneum over the dome of the uterus.  I visualized the staple line.  This was well away from the fistula formation.  I again ran the small bowel.  There is no further steriles times activity seen.  At this time the abdominal cavity was irrigated out with sterile saline.  At this time #1 PDS x2 was used to reapproximate the midline fascia.  The hernia was incorporated into the closure.  The skin was then reapproximated using skin staples.  All trocars removed and the port sites were closed using skin staples.  Patient tolerated the procedure well was taken to the recovery room in stable condition.  I was personally present during the key and critical portions of this procedure and immediately available throughout the entire procedure, as documented in my operative note.  PLAN OF CARE: Admit for overnight observation  PATIENT DISPOSITION:  PACU - hemodynamically stable.   Delay start of Pharmacological VTE agent (>24hrs) due to surgical blood loss or risk of bleeding: not applicable

## 2022-07-07 NOTE — Progress Notes (Signed)
Patient did not call pharmacy center for med rec before surgery as instructed. Patient here for surgery today. Called pharmacy tech to get med rec done and she stated she was on lunch.

## 2022-07-07 NOTE — Anesthesia Postprocedure Evaluation (Signed)
Anesthesia Post Note  Patient: Cynthia Morrison  Procedure(s) Performed: DIAGNOSTIC LAPAROSCOPY (Abdomen) EXPLORATORY LAPAROTOMY (Abdomen) LYSIS OF ADHESIONS (Abdomen)     Patient location during evaluation: PACU Anesthesia Type: General Level of consciousness: awake Pain management: pain level controlled Vital Signs Assessment: post-procedure vital signs reviewed and stable Respiratory status: spontaneous breathing, nonlabored ventilation, respiratory function stable and patient connected to nasal cannula oxygen Cardiovascular status: blood pressure returned to baseline and stable Postop Assessment: no apparent nausea or vomiting Anesthetic complications: no   No notable events documented.  Last Vitals:  Vitals:   07/07/22 1545 07/07/22 1614  BP: (!) 121/59 (!) 125/59  Pulse: 63 86  Resp: 12 16  Temp: 36.6 C 36.6 C  SpO2: 98% 94%    Last Pain:  Vitals:   07/07/22 1713  TempSrc:   PainSc: Asleep                 Haile Bosler P Nita Whitmire

## 2022-07-08 ENCOUNTER — Encounter (HOSPITAL_COMMUNITY): Payer: Self-pay | Admitting: General Surgery

## 2022-07-08 LAB — CBC
HCT: 36.7 % (ref 36.0–46.0)
Hemoglobin: 12.4 g/dL (ref 12.0–15.0)
MCH: 32.5 pg (ref 26.0–34.0)
MCHC: 33.8 g/dL (ref 30.0–36.0)
MCV: 96.1 fL (ref 80.0–100.0)
Platelets: 233 10*3/uL (ref 150–400)
RBC: 3.82 MIL/uL — ABNORMAL LOW (ref 3.87–5.11)
RDW: 14.5 % (ref 11.5–15.5)
WBC: 11.1 10*3/uL — ABNORMAL HIGH (ref 4.0–10.5)
nRBC: 0 % (ref 0.0–0.2)

## 2022-07-08 LAB — BASIC METABOLIC PANEL
Anion gap: 10 (ref 5–15)
BUN: 8 mg/dL (ref 8–23)
CO2: 22 mmol/L (ref 22–32)
Calcium: 8.3 mg/dL — ABNORMAL LOW (ref 8.9–10.3)
Chloride: 104 mmol/L (ref 98–111)
Creatinine, Ser: 0.77 mg/dL (ref 0.44–1.00)
GFR, Estimated: >60 mL/min (ref >60)
Glucose, Bld: 196 mg/dL — ABNORMAL HIGH (ref 70–99)
Potassium: 4.1 mmol/L (ref 3.5–5.1)
Sodium: 136 mmol/L (ref 135–145)

## 2022-07-08 NOTE — Progress Notes (Signed)
   07/08/22 2203  Hygiene  Hygiene Patient refused

## 2022-07-08 NOTE — Progress Notes (Signed)
1 Day Post-Op   Subjective/Chief Complaint: Pt doing well this AM Some abd soreness Tol clears   Objective: Vital signs in last 24 hours: Temp:  [97.7 F (36.5 C)-98.2 F (36.8 C)] 98.1 F (36.7 C) (10/17 0418) Pulse Rate:  [63-135] 81 (10/17 0418) Resp:  [12-17] 16 (10/16 1614) BP: (116-175)/(59-90) 117/66 (10/17 0418) SpO2:  [93 %-99 %] 96 % (10/17 0418) Weight:  [54.9 kg] 54.9 kg (10/16 1127) Last BM Date : 07/06/22  Intake/Output from previous day: 10/16 0701 - 10/17 0700 In: 2573.2 [I.V.:2473.2; IV Piggyback:100] Out: 375 [Urine:300; Blood:75] Intake/Output this shift: Total I/O In: 966.4 [I.V.:966.4] Out: -   General appearance: alert and cooperative GI: soft, non-tender; bowel sounds normal; no masses,  no organomegaly and inc c/d/i  Lab Results:  Recent Labs    07/08/22 0229  WBC 11.1*  HGB 12.4  HCT 36.7  PLT 233   BMET Recent Labs    07/08/22 0229  NA 136  K 4.1  CL 104  CO2 22  GLUCOSE 196*  BUN 8  CREATININE 0.77  CALCIUM 8.3*   PT/INR No results for input(s): "LABPROT", "INR" in the last 72 hours. ABG No results for input(s): "PHART", "HCO3" in the last 72 hours.  Invalid input(s): "PCO2", "PO2"  Studies/Results: No results found.  Anti-infectives: Anti-infectives (From admission, onward)    Start     Dose/Rate Route Frequency Ordered Stop   07/07/22 1145  sodium chloride 0.9 % with cefoTEtan (CEFOTAN) ADS Med       Note to Pharmacy: [place]: cabinet override      07/07/22 1145 07/07/22 1254   07/07/22 1130  cefoTEtan (CEFOTAN) 2 g in sodium chloride 0.9 % 100 mL IVPB        2 g 200 mL/hr over 30 Minutes Intravenous On call to O.R. 07/07/22 1127 07/07/22 1243       Assessment/Plan: s/p Procedure(s): DIAGNOSTIC LAPAROSCOPY (N/A) EXPLORATORY LAPAROTOMY (N/A) LYSIS OF ADHESIONS (N/A) Pod #1 -adv diet as tol -mobilize -hep lock IVF -hopefully home tomorrow if doing well  LOS: 1 day    Ralene Ok 07/08/2022

## 2022-07-08 NOTE — Progress Notes (Signed)
Mobility Specialist - Progress Note   07/08/22 1159  Mobility  Activity Ambulated with assistance in hallway  Level of Assistance Contact guard assist, steadying assist  Assistive Device None  Distance Ambulated (ft) 550 ft  Activity Response Tolerated well  $Mobility charge 1 Mobility    Pt received in bed agreeable to mobility. Left in bed w/ call bell in reach and and all needs met.   Paulla Dolly Mobility Specialist

## 2022-07-09 MED ORDER — POLYETHYLENE GLYCOL 3350 17 G PO PACK
17.0000 g | PACK | Freq: Every day | ORAL | Status: DC
  Administered 2022-07-09 – 2022-07-10 (×2): 17 g via ORAL
  Filled 2022-07-09 (×2): qty 1

## 2022-07-09 NOTE — Plan of Care (Signed)

## 2022-07-09 NOTE — Progress Notes (Signed)
2 Days Post-Op   Subjective/Chief Complaint: PT doing well today Tol FLD SOme flatus Still with some pain   Objective: Vital signs in last 24 hours: Temp:  [98 F (36.7 C)-98.6 F (37 C)] 98.6 F (37 C) (10/18 0531) Pulse Rate:  [66-89] 66 (10/18 0531) Resp:  [18] 18 (10/17 1453) BP: (123-147)/(67-77) 133/74 (10/18 0531) SpO2:  [94 %-100 %] 94 % (10/18 0531) Last BM Date : 07/08/22  Intake/Output from previous day: 10/17 0701 - 10/18 0700 In: 747 [P.O.:120; I.V.:627] Out: -  Intake/Output this shift: No intake/output data recorded.  PE:  Constitutional: No acute distress, conversant, appears states age. Eyes: Anicteric sclerae, moist conjunctiva, no lid lag Lungs: Clear to auscultation bilaterally, normal respiratory effort CV: regular rate and rhythm, no murmurs, no peripheral edema, pedal pulses 2+ GI: Soft, no masses or hepatosplenomegaly, non-tender to palpation Skin: No rashes, palpation reveals normal turgor Psychiatric: appropriate judgment and insight, oriented to person, place, and time   Lab Results:  Recent Labs    07/08/22 0229  WBC 11.1*  HGB 12.4  HCT 36.7  PLT 233   BMET Recent Labs    07/08/22 0229  NA 136  K 4.1  CL 104  CO2 22  GLUCOSE 196*  BUN 8  CREATININE 0.77  CALCIUM 8.3*   Anti-infectives: Anti-infectives (From admission, onward)    Start     Dose/Rate Route Frequency Ordered Stop   07/07/22 1145  sodium chloride 0.9 % with cefoTEtan (CEFOTAN) ADS Med       Note to Pharmacy: [place]: cabinet override      07/07/22 1145 07/07/22 1254   07/07/22 1130  cefoTEtan (CEFOTAN) 2 g in sodium chloride 0.9 % 100 mL IVPB        2 g 200 mL/hr over 30 Minutes Intravenous On call to O.R. 07/07/22 1127 07/08/22 0753       Assessment/Plan: s/p Procedure(s): DIAGNOSTIC LAPAROSCOPY (N/A) EXPLORATORY LAPAROTOMY (N/A) LYSIS OF ADHESIONS (N/A) Advance diet to soft today Mobilize Will plan home tomorrow if con't to do  well  LOS: 2 days    Ralene Ok 07/09/2022

## 2022-07-09 NOTE — Progress Notes (Signed)
Mobility Specialist - Progress Note   07/09/22 1630  Mobility  Activity Ambulated independently in hallway  Level of Assistance Independent  Assistive Device None  Distance Ambulated (ft) 1650 ft  Activity Response Tolerated well  $Mobility charge 1 Mobility    Pt received in bed agreeable to mobility. Left in bed w/ call bell and all needs met.   Paulla Dolly Mobility Specialist

## 2022-07-09 NOTE — Progress Notes (Signed)
Mobility Specialist - Progress Note   07/09/22 1103  Mobility  Activity Ambulated with assistance in hallway  Level of Assistance Independent  Assistive Device None  Distance Ambulated (ft) 1650 ft  Activity Response Tolerated well  $Mobility charge 1 Mobility    Pt received in bed agreeable to mobility. Left in bed w/ call bell and all needs met.   Paulla Dolly Mobility Specialist

## 2022-07-10 MED ORDER — OXYCODONE HCL 5 MG PO TABS: 5.0000 mg | ORAL_TABLET | ORAL | 0 refills | Status: AC | PRN

## 2022-07-10 NOTE — Plan of Care (Signed)
  Problem: Health Behavior/Discharge Planning: Goal: Ability to manage health-related needs will improve Outcome: Progressing   Problem: Clinical Measurements: Goal: Ability to maintain clinical measurements within normal limits will improve Outcome: Progressing   

## 2022-07-10 NOTE — Discharge Summary (Signed)
Physician Discharge Summary  Patient ID: Cynthia Morrison MRN: [id number] DOB/AGE: 64/19/1957 64 y.o.  Admit date: 07/07/2022 Discharge date: 07/10/2022  Admission Diagnoses: Abscess with drain  Discharge Diagnoses:  Principal Problem:   S/P exploratory laparotomy   Discharged Condition: good  Hospital Course: Patient was admitted postoperatively after Sporter laparotomy and removal of drainage catheter.  Please see op note for details.  Patient did well postoperatively.  She was close transition from a clinical diet to a regular diet she was able tolerate well.  She had good bowel function.  She good pain control.  She was ambulated well in the room.  Otherwise she was afebrile deemed stable for discharge and discharged home.  Consults: None  Significant Diagnostic Studies: None  Treatments: surgery: As above  Discharge Exam: Blood pressure (!) 153/82, pulse 73, temperature 98.2 F (36.8 C), temperature source Oral, resp. rate 16, height 5' (1.524 m), weight 54.9 kg, SpO2 94 %. General appearance: alert and cooperative GI: soft, non-tender; bowel sounds normal; no masses,  no organomegaly and incision clean dry and intact.  Disposition: Discharge disposition: 01-Home or Self Care       Discharge Instructions     Diet - low sodium heart healthy   Complete by: As directed    Increase activity slowly   Complete by: As directed       Allergies as of 07/10/2022       Reactions   Buspar [buspirone] Nausea And Vomiting, Other (See Comments)   Hallucinations    Codeine Other (See Comments)   Nightmares    Zoloft [sertraline Hcl] Nausea And Vomiting, Other (See Comments)   GI upset   Aleve [naproxen Sodium] Rash   Takes Ibuprofen with no issues   Bactrim [sulfamethoxazole-trimethoprim] Rash   Pt states she is not allergic to this medication        Medication List     TAKE these medications    amitriptyline 25 MG tablet Commonly known as: ELAVIL Take 25  mg by mouth at bedtime.   amLODipine 5 MG tablet Commonly known as: NORVASC Take 1 tablet (5 mg total) by mouth daily. What changed: when to take this   docusate sodium 100 MG capsule Commonly known as: COLACE Take 1 capsule (100 mg total) by mouth every 12 (twelve) hours.   losartan 50 MG tablet Commonly known as: COZAAR Take 50 mg by mouth 2 (two) times daily.   Normal Saline Flush 0.9 % Soln Flush with 51mls in each drain 2 times a day   ondansetron 4 MG disintegrating tablet Commonly known as: ZOFRAN-ODT Take 1 tablet (4 mg total) by mouth every 8 (eight) hours as needed for nausea or vomiting.   oxyCODONE 5 MG immediate release tablet Commonly known as: Oxy IR/ROXICODONE Take 1-2 tablets (5-10 mg total) by mouth every 4 (four) hours as needed for moderate pain.        Follow-up Information     Ralene Ok, MD. Schedule an appointment as soon as possible for a visit in 3 week(s).   Specialty: General Surgery Why: Post op visit Contact information: [place] [id number] [phone]                 Signed: Ralene Ok 07/10/2022, 1:06 PM

## 2022-07-10 NOTE — Plan of Care (Signed)
  Problem: Education: Goal: Knowledge of General Education information will improve Description: Including pain rating scale, medication(s)/side effects and non-pharmacologic comfort measures Outcome: Adequate for Discharge   Problem: Health Behavior/Discharge Planning: Goal: Ability to manage health-related needs will improve 07/10/2022 1052 by Santa Lighter, RN Outcome: Adequate for Discharge 07/10/2022 0905 by Santa Lighter, RN Outcome: Progressing   Problem: Clinical Measurements: Goal: Ability to maintain clinical measurements within normal limits will improve 07/10/2022 1052 by Santa Lighter, RN Outcome: Adequate for Discharge 07/10/2022 0905 by Santa Lighter, RN Outcome: Progressing Goal: Will remain free from infection Outcome: Adequate for Discharge Goal: Diagnostic test results will improve Outcome: Adequate for Discharge Goal: Respiratory complications will improve Outcome: Adequate for Discharge Goal: Cardiovascular complication will be avoided Outcome: Adequate for Discharge   Problem: Activity: Goal: Risk for activity intolerance will decrease Outcome: Adequate for Discharge   Problem: Nutrition: Goal: Adequate nutrition will be maintained Outcome: Adequate for Discharge   Problem: Coping: Goal: Level of anxiety will decrease Outcome: Adequate for Discharge   Problem: Elimination: Goal: Will not experience complications related to bowel motility Outcome: Adequate for Discharge Goal: Will not experience complications related to urinary retention Outcome: Adequate for Discharge   Problem: Pain Managment: Goal: General experience of comfort will improve Outcome: Adequate for Discharge   Problem: Safety: Goal: Ability to remain free from injury will improve Outcome: Adequate for Discharge   Problem: Skin Integrity: Goal: Risk for impaired skin integrity will decrease Outcome: Adequate for Discharge

## 2022-07-10 NOTE — Discharge Instructions (Signed)
ABDOMINAL SURGERY: POST OP INSTRUCTIONS  DIET: Follow a light bland diet the first 24 hours after arrival home, such as soup, liquids, crackers, etc.  Be sure to include lots of fluids daily.  Avoid fast food or heavy meals as your are more likely to get nauseated.  Eat a low fat the next few days after surgery.    Take your usually prescribed home medications unless otherwise directed.  PAIN CONTROL: Pain is best controlled by a usual combination of three different methods TOGETHER: Ice/Heat Over the counter pain medication Prescription pain medication Most patients will experience some swelling and bruising around the incisions.  Ice packs or heating pads (30-60 minutes up to 6 times a day) will help. Use ice for the first few days to help decrease swelling and bruising, then switch to heat to help relax tight/sore spots and speed recovery.  Some people prefer to use ice alone, heat alone, alternating between ice & heat.  Experiment to what works for you.  Swelling and bruising can take several weeks to resolve.   It is helpful to take an over-the-counter pain medication regularly for the first few weeks.  Choose one of the following that works best for you: Naproxen (Aleve, etc)  Two 220mg  tabs twice a day Ibuprofen (Advil, etc) Three 200mg  tabs four times a day (every meal & bedtime) Acetaminophen (Tylenol, etc) 500-650mg  four times a day (every meal & bedtime) A  prescription for pain medication (such as oxycodone, hydrocodone, etc) should be given to you upon discharge.  Take your pain medication as prescribed.  If you are having problems/concerns with the prescription medicine (does not control pain, nausea, vomiting, rash, itching, etc), please call us [phone] to see if we need to switch you to a different pain medicine that will work better for you and/or control your side effect better. If you need a refill on your pain medication, please contact your pharmacy.  They will contact  our office to request authorization. Prescriptions will not be filled after 5 pm or on week-ends.  Avoid getting constipated.  Between the surgery and the pain medications, it is common to experience some constipation.  Increasing fluid intake and taking a fiber supplement (such as Metamucil, Citrucel, FiberCon, MiraLax, etc) 1-2 times a day regularly will usually help prevent this problem from occurring.  A mild laxative (prune juice, Milk of Magnesia, MiraLax, etc) should be taken according to package directions if there are no bowel movements after 48 hours.   Watch out for diarrhea.  If you have many loose bowel movements, simplify your diet to bland foods & liquids for a few days.  Stop any stool softeners and decrease your fiber supplement.  Switching to mild anti-diarrheal medications (Loperamide/Imodium, Kayopectate, Pepto Bismol) can help.  If this worsens or does not improve, please call us.  Wash / shower every day.  You may shower over the incision / wound.  Avoid baths until the skin is fully healed.  Continue to shower over incision(s) after the dressing is off. Remove your waterproof bandages 5 days after surgery.  You may leave the incision open to air.  Remove any wicks or ribbons in your wound.  If you have an open wound, please see wound care instructions. You may replace a dressing/Band-Aid to cover the incision for comfort if you wish.  ACTIVITIES as tolerated:   You may resume regular (light) daily activities beginning the next day--such as daily self-care, walking, climbing stairs--gradually increasing activities as  tolerated.  If you can walk 30 minutes without difficulty, it is safe to try more intense activity such as jogging, treadmill, bicycling, low-impact aerobics, swimming, etc. Save the most intensive and strenuous activity for last such as sit-ups, heavy lifting, contact sports, etc  Refrain from any heavy lifting or straining until you are off narcotics for pain control.    DO NOT PUSH THROUGH PAIN.  Let pain be your guide: If it hurts to do something, don't do it.  Pain is your body warning you to avoid that activity for another week until the pain goes down. You may drive when you are no longer taking prescription pain medication, you can comfortably wear a seatbelt, and you can safely maneuver your car and apply brakes. You may have sexual intercourse when it is comfortable.   FOLLOW UP in our office Please call [facility] at [phone] to set up an appointment to see your surgeon in the office for a follow-up appointment approximately 1-2 weeks after your surgery. Make sure that you call for this appointment the day you arrive home to insure a convenient appointment time. 10. IF YOU HAVE DISABILITY OR FAMILY LEAVE FORMS, BRING THEM TO THE OFFICE FOR PROCESSING.  DO NOT GIVE THEM TO YOUR DOCTOR.   WHEN TO CALL us [phone]: Poor pain control Reactions / problems with new medications (rash/itching, nausea, etc)  Fever over 101.5 F (38.5 C) Inability to urinate Nausea and/or vomiting Worsening swelling or bruising Continued bleeding from incision. Increased pain, redness, or drainage from the incision  The clinic staff is available to answer your questions during regular business hours (8:30am-5pm).  Please don't hesitate to call and ask to speak to one of our nurses for clinical concerns.   A surgeon from [facility] is always on call at the hospitals   If you have a medical emergency, go to the nearest emergency room or call 911.    [facility], [address], [place], [place], [place]  [address] ? MAIN: [phone] ? TOLL FREE: [phone] ? FAX [phone] [email or web address]

## 2023-10-11 IMAGING — DX DG ABDOMEN 2V
2 series · 2 of 2 positions shown · non-contrast
Comparison: None.

CLINICAL DATA: Abdominal pain.  Bulging groin.

EXAM:
ABDOMEN - 2 VIEW

[abdomen erect]
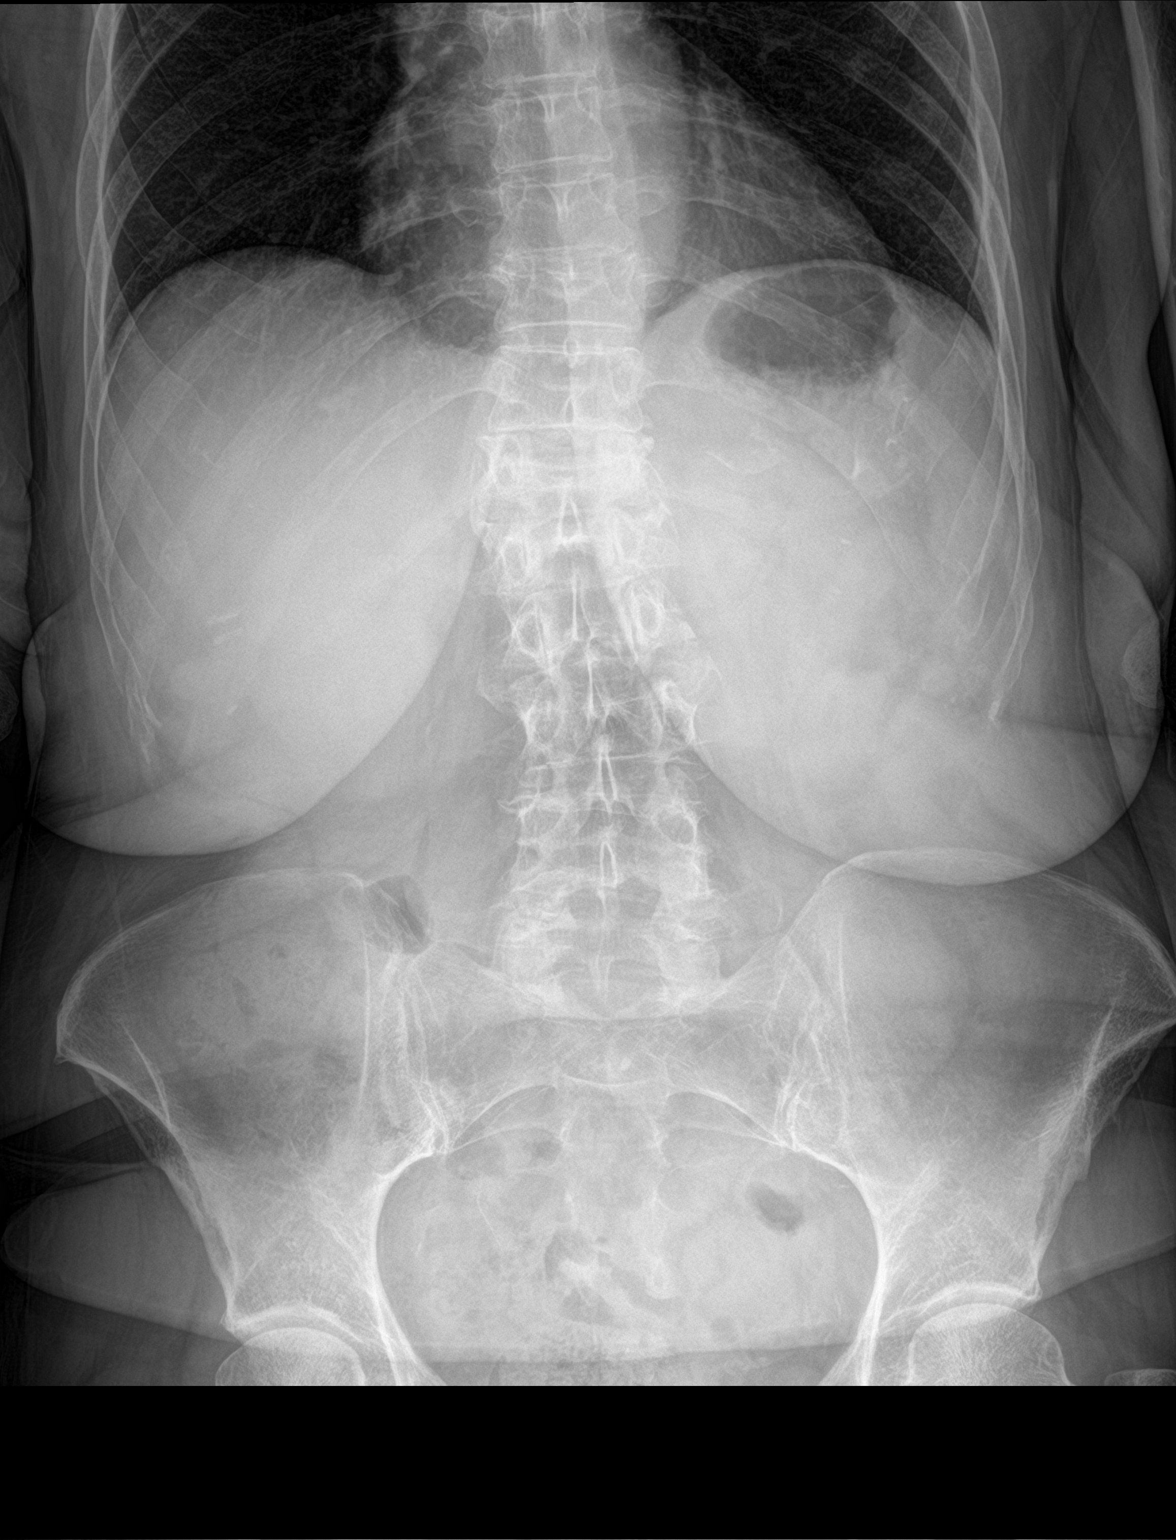

[abdomen supine]
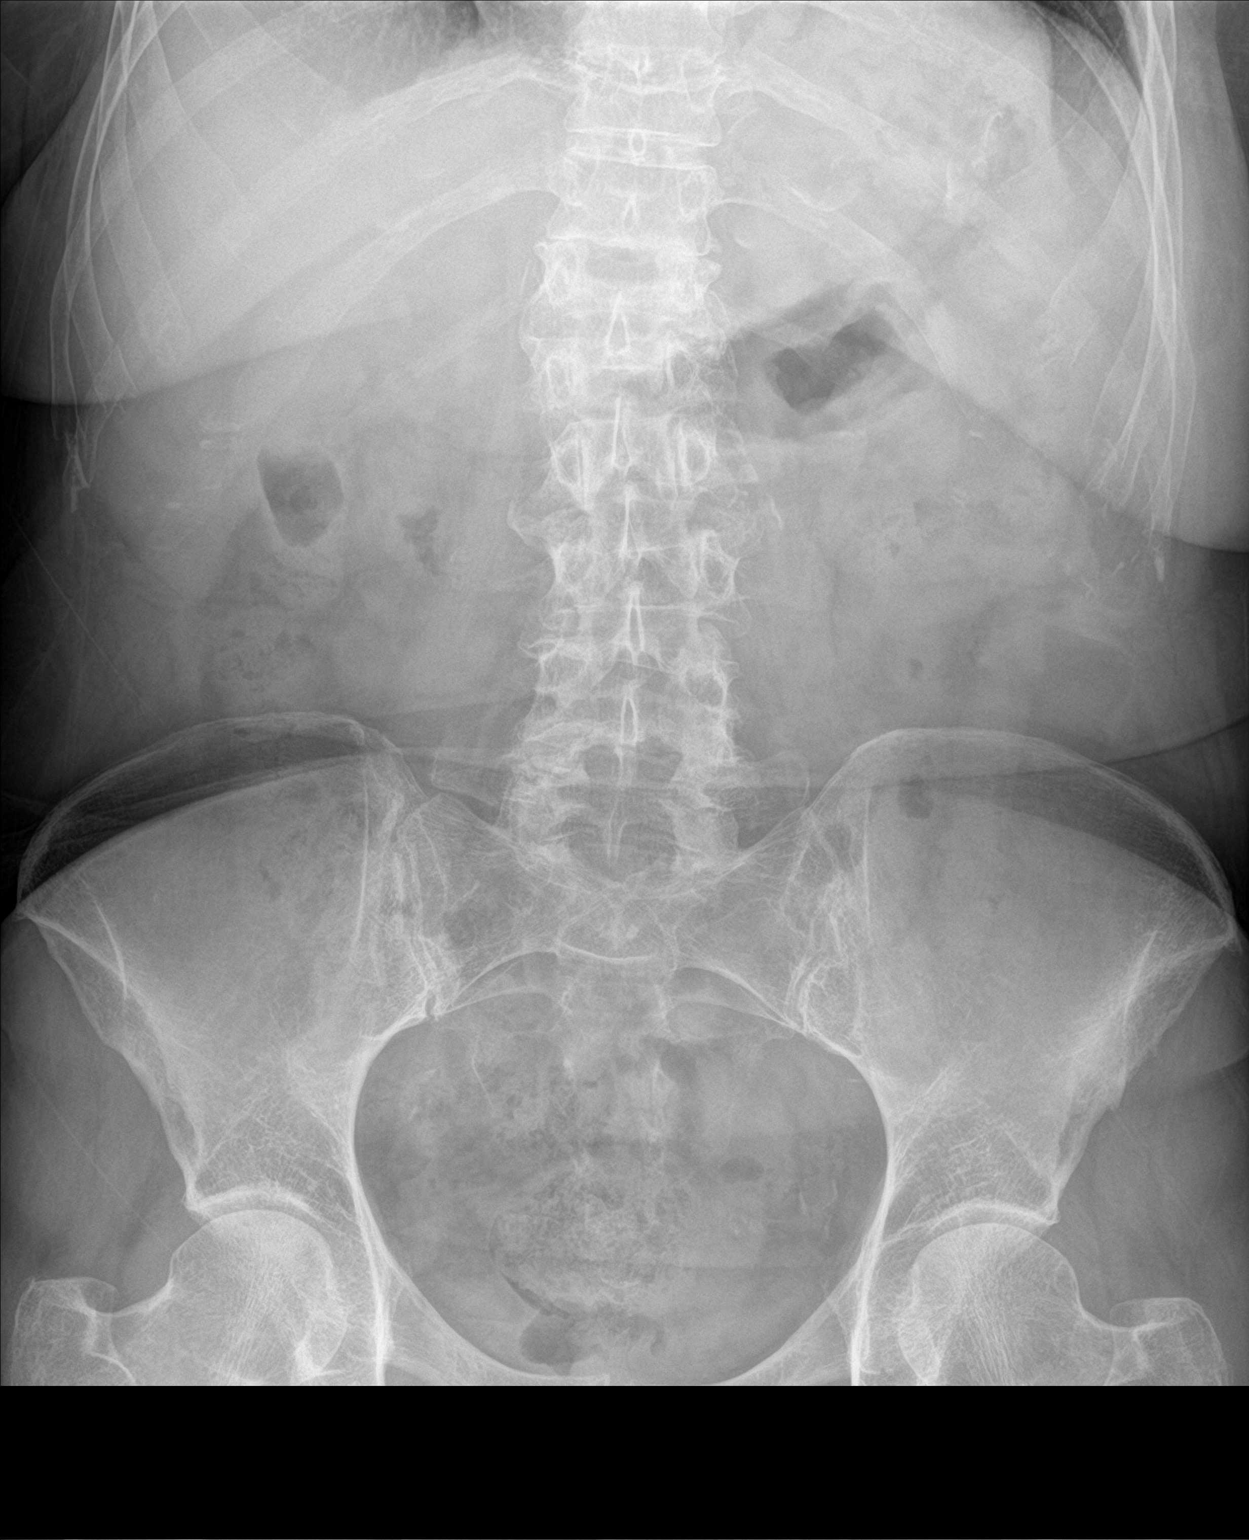

[2 of 2 positions shown; findings below may reference images not displayed]

FINDINGS: Normal bowel gas pattern. No evidence for free air. Moderate amount
of stool particularly in the rectum. Possible small left renal
calculi. Degenerative changes along the left side of the upper
lumbar spine.
IMPRESSION: 1. Normal bowel gas pattern.
2. Moderate stool burden.
3. Possible left renal calculi.

## 2023-10-15 IMAGING — DX DG ABD PORTABLE 1V
1 series · 1 of 1 positions shown · non-contrast
Comparison: Radiograph 12/13/2021

CLINICAL DATA: OG tube placement

EXAM:
PORTABLE ABDOMEN - 1 VIEW

[abdomen supine]
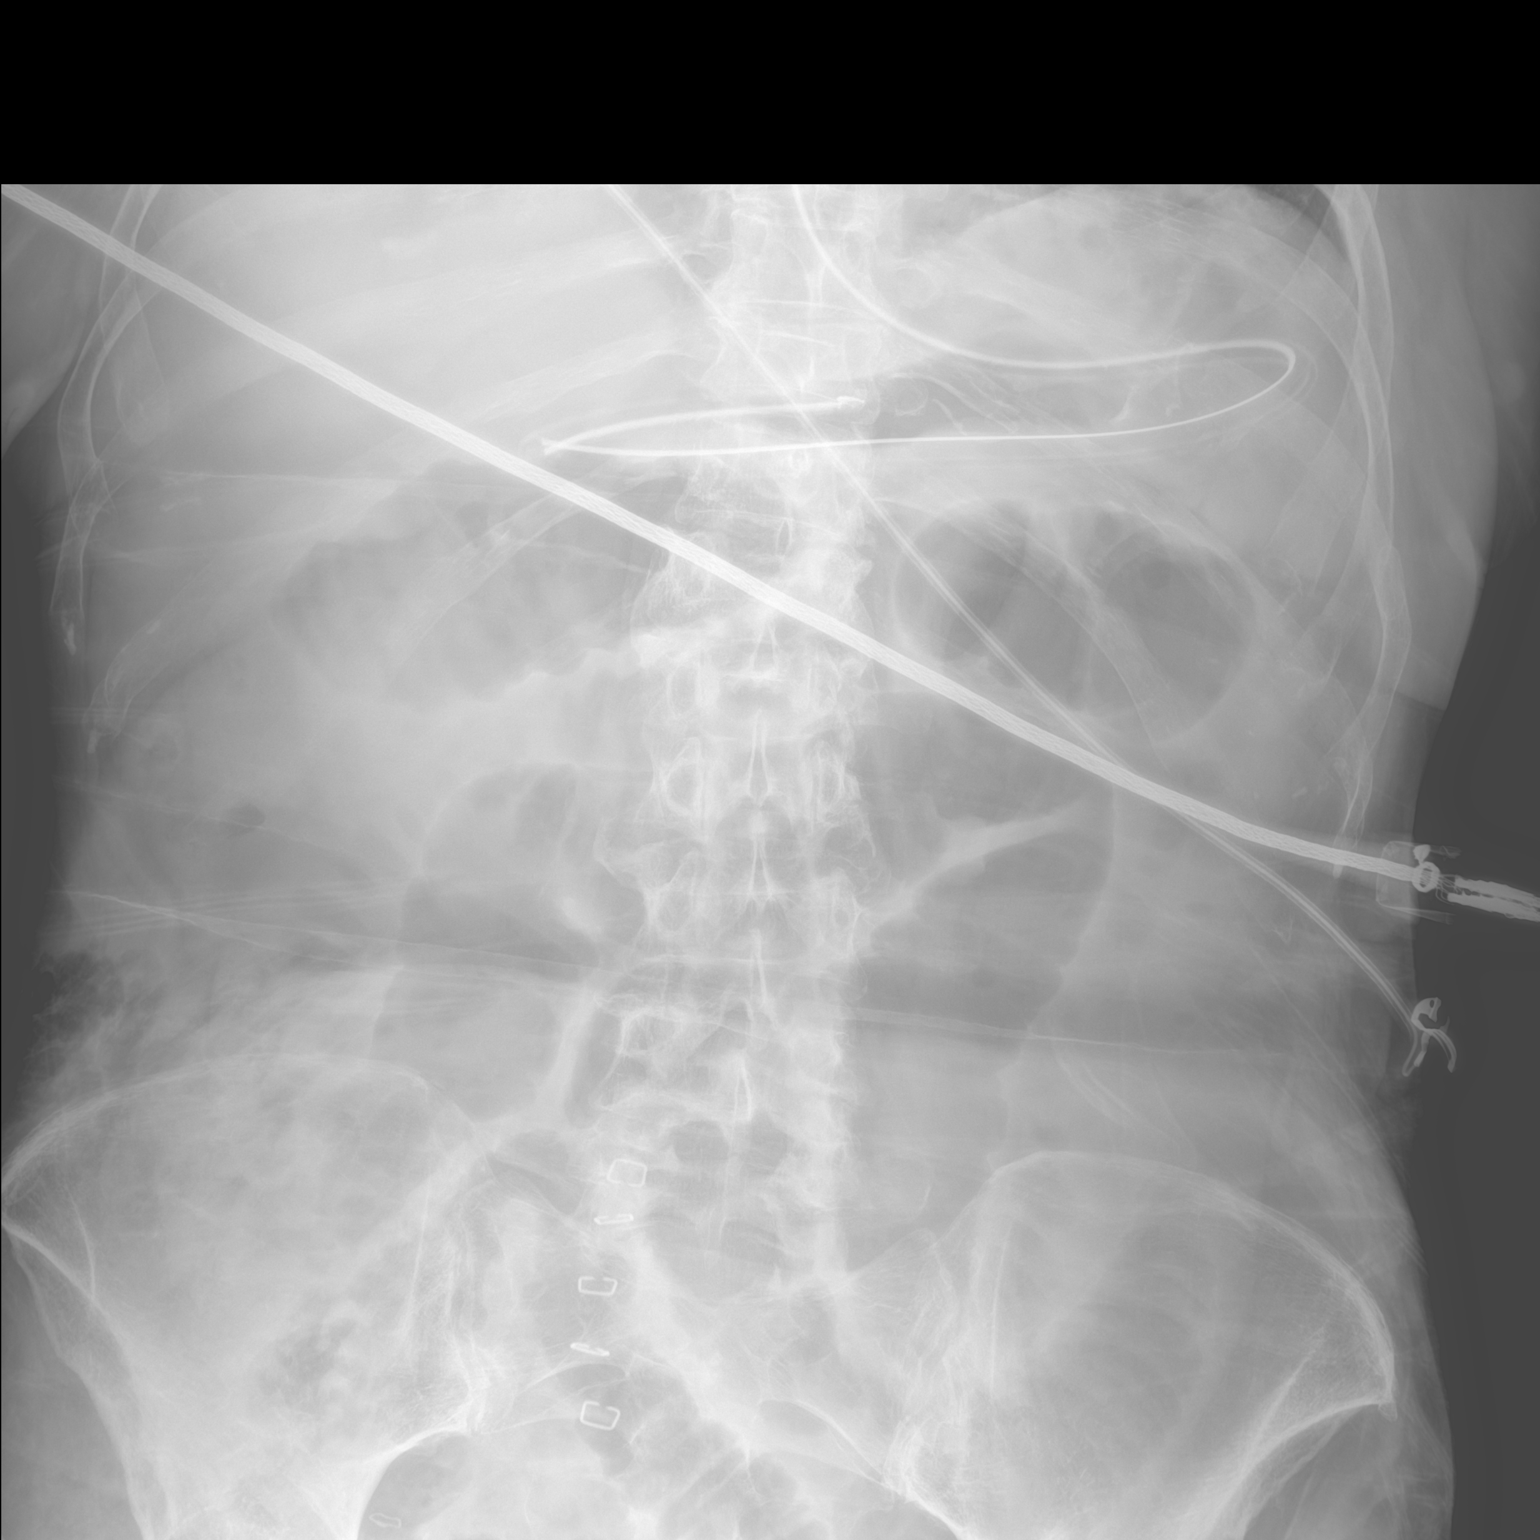

[1 of 1 positions shown; findings below may reference images not displayed]

FINDINGS: Enteric tube tip and side port overlie the stomach. There are
multiple loops of dilated small bowel. There are staples overlying
the lower abdomen.
IMPRESSION: Enteric tube tip and side port overlie the stomach.

Multiple loops of dilated small bowel, concerning for small-bowel
obstruction.

## 2023-10-21 IMAGING — CT CT ABD-PELV W/ CM
2 of 5 series · 11 of 46 positions shown, 12 images · IV contrast (agent unspecified)
Comparison: None.
COMPARISON: None.

Addendum:
CLINICAL DATA: Postoperative abdominal pain.

EXAM:
CT ABDOMEN AND PELVIS WITH CONTRAST
TECHNIQUE: Multidetector CT imaging of the abdomen and pelvis was performed
using the standard protocol following bolus administration of
intravenous contrast.

[Series 4: abdomen 5.0 · axial · 0.72mm/px · z∈[+514,+934]mm · 8 of 99 slices shown, 9 images]
[im 8/99  soft-tissue]
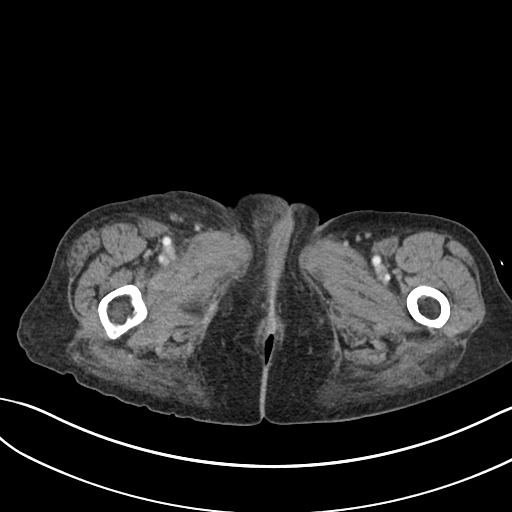
[im 8/99  bone]
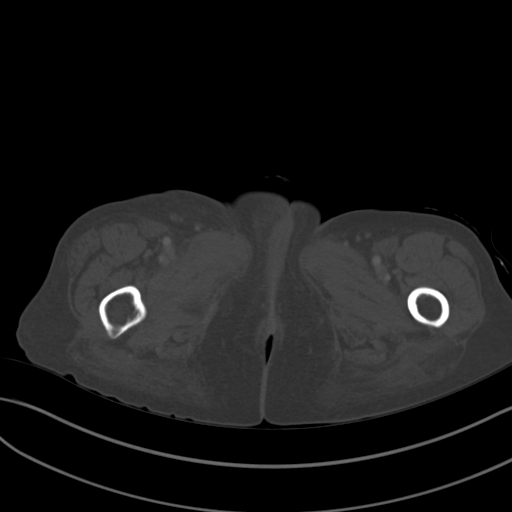
[im 22/99  soft-tissue]
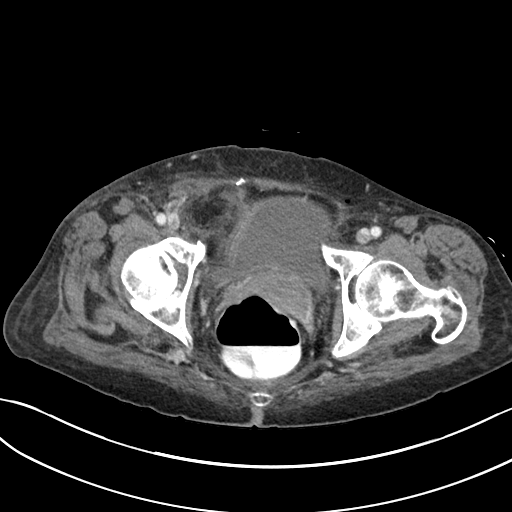
[im 36/99  soft-tissue]
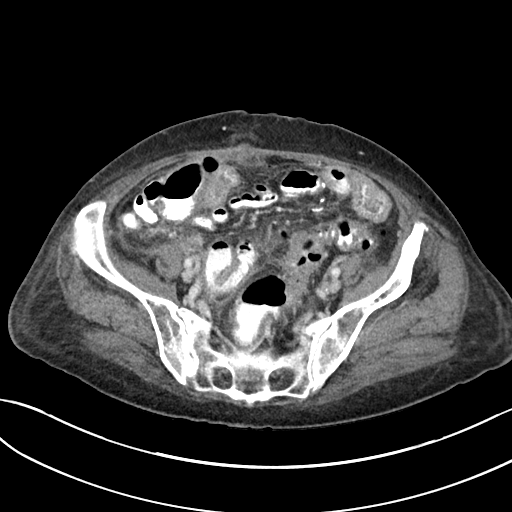
[im 43/99  soft-tissue]
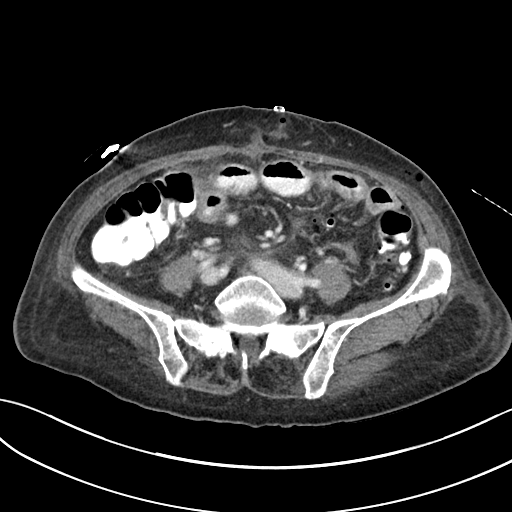
[im 57/99  soft-tissue]
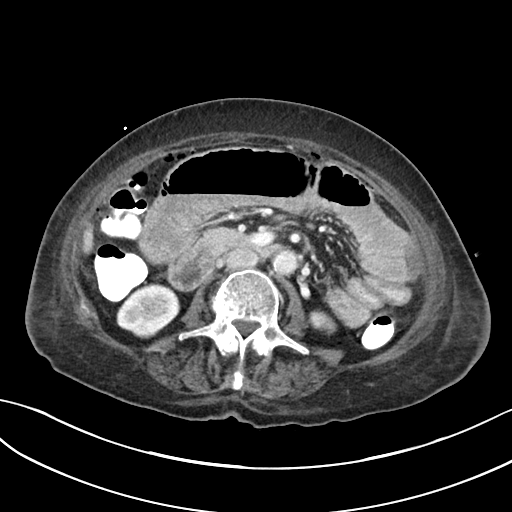
[im 64/99  soft-tissue]
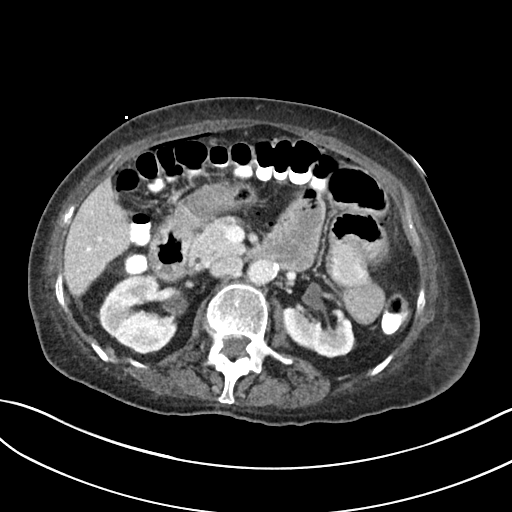
[im 78/99  soft-tissue]
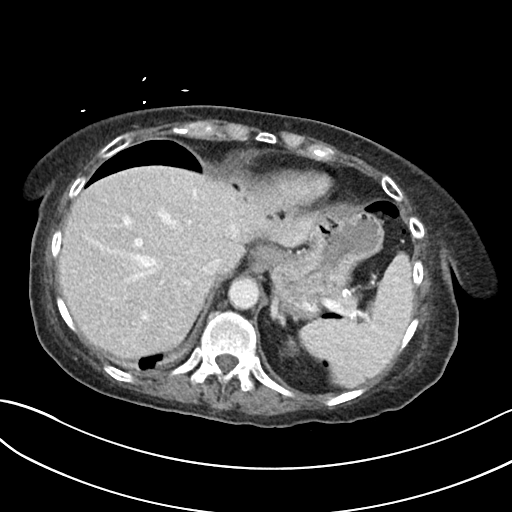
[im 92/99  soft-tissue]
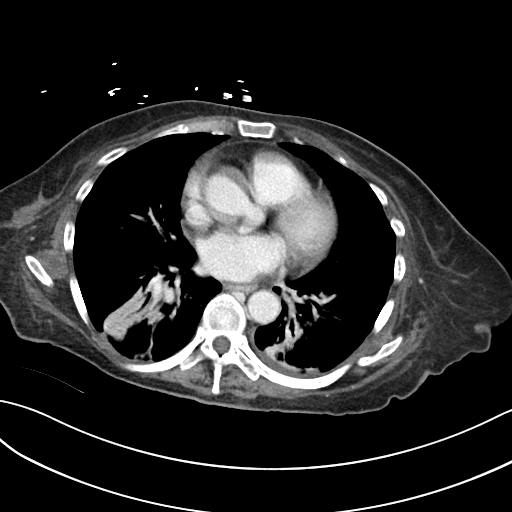

[Series 7: abdomen 3.0 mpr cor · coronal · 0.84mm/px · 3 of 82 slices shown]
[im 28/82  soft-tissue]
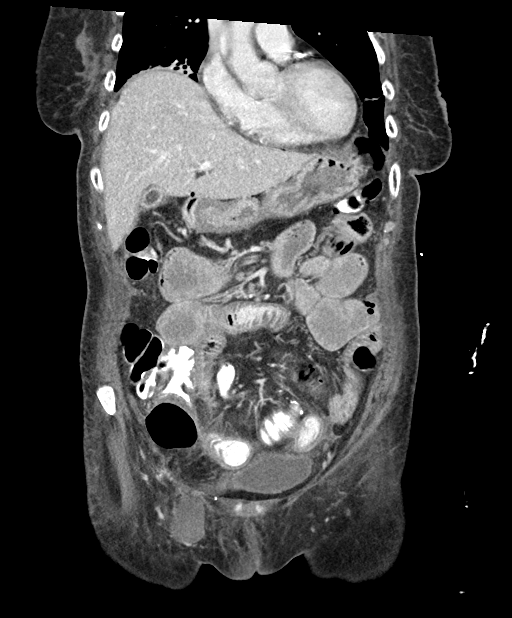
[im 37/82  soft-tissue]
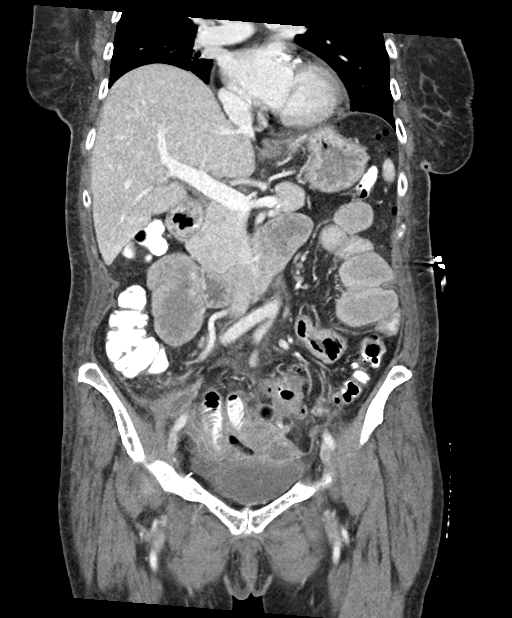
[im 46/82  soft-tissue]
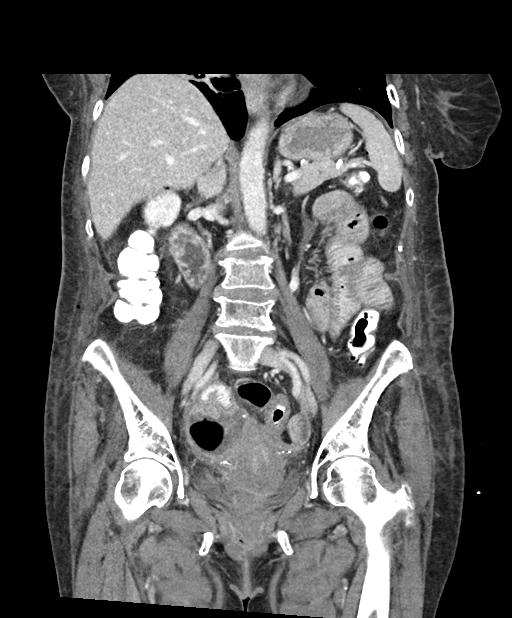

[11 of 46 positions shown; findings below may reference images not displayed]

RADIATION DOSE REDUCTION: This exam was performed according to the
departmental dose-optimization program which includes automated
exposure control, adjustment of the mA and/or kV according to
patient size and/or use of iterative reconstruction technique.

CONTRAST:  100mL OMNIPAQUE IOHEXOL 300 MG/ML  SOLN
FINDINGS: Lower chest: There is patchy airspace disease in the bilateral lower
lobes and minimally in the right middle lobe with air bronchograms.

Hepatobiliary: There is a hypodensity in the posterior right lobe of
the liver which is too small to characterize, likely a small cyst or
hemangioma. Liver, gallbladder and bile ducts are otherwise within
normal limits.

Pancreas: Unremarkable. No pancreatic ductal dilatation or
surrounding inflammatory changes.

Spleen: Normal in size without focal abnormality.

Adrenals/Urinary Tract: Adrenal glands are unremarkable. Kidneys are
normal, without renal calculi, focal lesion, or hydronephrosis.
There is a small amount of air in the bladder. Bladder otherwise
appears within normal limits.

Stomach/Bowel: Oral contrast reaches the rectum. Jejunal loops are
dilated with air-fluid levels measuring up to 4.5 cm in diameter.
There is some questionable pneumatosis involving these bowel loops
with mild wall thickening. No abrupt transition point visualized.
Distal small bowel loops are nondilated.

Additionally, there is some wall thickening of small bowel loops in
the pelvis.

The colon is nondilated. Appendix is not visualized. There is
sigmoid colon diverticulosis. There is also some wall thickening of
the sigmoid colon.

Vascular/Lymphatic: No significant vascular findings are present. No
enlarged abdominal or pelvic lymph nodes.

Reproductive: Uterus and bilateral adnexa are unremarkable.

Other: There is a moderate amount of free air likely related to
recent surgery. There is an enhancing fluid collection with
air-fluid level in the cul-de-sac measuring 4.6 x 4.7 x 9.2 cm.

There is also an air-fluid collection in the anterior right pelvis
image 4/69 measuring 4.4 x 5.6 by 4.1 cm. This collection abuts the
cecum.

Patient is status post right inguinal hernia repair. There is an
air-fluid collection in the right inguinal region measuring 3.9 x
2.3 by 4.5 cm without enhancement. There is also small amount of air
in the left inguinal region. Heterogeneous fat is seen at the origin
of the right inguinal canal, likely postsurgical.

Musculoskeletal: There are chronic appearing compression deformities
of T12, L1 and L3.
IMPRESSION: 1. Status post right inguinal hernia repair. There is an air-fluid
collection without enhancement in the right inguinal region
measuring 3.9 x 2.3 x 4.5 cm which may represent postoperative
seroma or resolving hematoma. Abscess is less likely, but not
excluded.
2. There are 2 enhancing air-fluid collections in the pelvic
cul-de-sac and right pelvis worrisome for abscesses.
3. Moderate free air in the abdomen, likely postsurgical.
4. Dilated jejunal loops with wall thickening and possible
pneumatosis. Bowel ischemia not excluded.
5. Additional wall thickening of sigmoid colon and pelvic small
bowel loops, likely reactive enteritis/colitis secondary to
infection.
6. Small amount of air in the bladder, likely iatrogenic.
7. Bilateral lower lobe and right middle lobe airspace disease with
air bronchograms compatible with multifocal pneumonia.

ADDENDUM:
These results were called by telephone at the time of interpretation
on 12/23/2021 at [DATE] to provider KAKI JIM , who verbally
acknowledged these results.

*** End of Addendum ***
RADIATION DOSE REDUCTION: This exam was performed according to the
departmental dose-optimization program which includes automated
exposure control, adjustment of the mA and/or kV according to
patient size and/or use of iterative reconstruction technique.

CONTRAST:  100mL OMNIPAQUE IOHEXOL 300 MG/ML  SOLN
FINDINGS: Lower chest: There is patchy airspace disease in the bilateral lower
lobes and minimally in the right middle lobe with air bronchograms.

Hepatobiliary: There is a hypodensity in the posterior right lobe of
the liver which is too small to characterize, likely a small cyst or
hemangioma. Liver, gallbladder and bile ducts are otherwise within
normal limits.

Pancreas: Unremarkable. No pancreatic ductal dilatation or
surrounding inflammatory changes.

Spleen: Normal in size without focal abnormality.

Adrenals/Urinary Tract: Adrenal glands are unremarkable. Kidneys are
normal, without renal calculi, focal lesion, or hydronephrosis.
There is a small amount of air in the bladder. Bladder otherwise
appears within normal limits.

Stomach/Bowel: Oral contrast reaches the rectum. Jejunal loops are
dilated with air-fluid levels measuring up to 4.5 cm in diameter.
There is some questionable pneumatosis involving these bowel loops
with mild wall thickening. No abrupt transition point visualized.
Distal small bowel loops are nondilated.

Additionally, there is some wall thickening of small bowel loops in
the pelvis.

The colon is nondilated. Appendix is not visualized. There is
sigmoid colon diverticulosis. There is also some wall thickening of
the sigmoid colon.

Vascular/Lymphatic: No significant vascular findings are present. No
enlarged abdominal or pelvic lymph nodes.

Reproductive: Uterus and bilateral adnexa are unremarkable.

Other: There is a moderate amount of free air likely related to
recent surgery. There is an enhancing fluid collection with
air-fluid level in the cul-de-sac measuring 4.6 x 4.7 x 9.2 cm.

There is also an air-fluid collection in the anterior right pelvis
image 4/69 measuring 4.4 x 5.6 by 4.1 cm. This collection abuts the
cecum.

Patient is status post right inguinal hernia repair. There is an
air-fluid collection in the right inguinal region measuring 3.9 x
2.3 by 4.5 cm without enhancement. There is also small amount of air
in the left inguinal region. Heterogeneous fat is seen at the origin
of the right inguinal canal, likely postsurgical.

Musculoskeletal: There are chronic appearing compression deformities
of T12, L1 and L3.
IMPRESSION: 1. Status post right inguinal hernia repair. There is an air-fluid
collection without enhancement in the right inguinal region
measuring 3.9 x 2.3 x 4.5 cm which may represent postoperative
seroma or resolving hematoma. Abscess is less likely, but not
excluded.
2. There are 2 enhancing air-fluid collections in the pelvic
cul-de-sac and right pelvis worrisome for abscesses.
3. Moderate free air in the abdomen, likely postsurgical.
4. Dilated jejunal loops with wall thickening and possible
pneumatosis. Bowel ischemia not excluded.
5. Additional wall thickening of sigmoid colon and pelvic small
bowel loops, likely reactive enteritis/colitis secondary to
infection.
6. Small amount of air in the bladder, likely iatrogenic.
7. Bilateral lower lobe and right middle lobe airspace disease with
air bronchograms compatible with multifocal pneumonia.
